# Patient Record
Sex: Female | Born: 1945 | Race: White | Hispanic: No | Marital: Single | Smoking: Current some day smoker
Health system: Southern US, Community
[De-identification: ages and names within clinical notes are randomized; demographics above are authoritative.]

## PROBLEM LIST (undated history)

## (undated) DIAGNOSIS — R0602 Shortness of breath: Secondary | ICD-10-CM

## (undated) DIAGNOSIS — T7840XA Allergy, unspecified, initial encounter: Secondary | ICD-10-CM

## (undated) DIAGNOSIS — I10 Essential (primary) hypertension: Secondary | ICD-10-CM

## (undated) DIAGNOSIS — K449 Diaphragmatic hernia without obstruction or gangrene: Secondary | ICD-10-CM

## (undated) DIAGNOSIS — F419 Anxiety disorder, unspecified: Secondary | ICD-10-CM

## (undated) DIAGNOSIS — F329 Major depressive disorder, single episode, unspecified: Secondary | ICD-10-CM

## (undated) DIAGNOSIS — IMO0002 Reserved for concepts with insufficient information to code with codable children: Secondary | ICD-10-CM

## (undated) DIAGNOSIS — F32A Depression, unspecified: Secondary | ICD-10-CM

## (undated) DIAGNOSIS — M1990 Unspecified osteoarthritis, unspecified site: Secondary | ICD-10-CM

## (undated) DIAGNOSIS — E7800 Pure hypercholesterolemia, unspecified: Secondary | ICD-10-CM

## (undated) DIAGNOSIS — J449 Chronic obstructive pulmonary disease, unspecified: Secondary | ICD-10-CM

## (undated) DIAGNOSIS — M719 Bursopathy, unspecified: Secondary | ICD-10-CM

## (undated) DIAGNOSIS — E119 Type 2 diabetes mellitus without complications: Secondary | ICD-10-CM

## (undated) HISTORY — DX: Chronic obstructive pulmonary disease, unspecified: J44.9

## (undated) HISTORY — PX: OTHER SURGICAL HISTORY: SHX169

## (undated) HISTORY — DX: Type 2 diabetes mellitus without complications: E11.9

## (undated) HISTORY — DX: Allergy, unspecified, initial encounter: T78.40XA

## (undated) HISTORY — PX: EYE SURGERY: SHX253

## (undated) HISTORY — PX: COLONOSCOPY: SHX174

## (undated) HISTORY — DX: Anxiety disorder, unspecified: F41.9

## (undated) HISTORY — DX: Depression, unspecified: F32.A

## (undated) HISTORY — DX: Major depressive disorder, single episode, unspecified: F32.9

## (undated) HISTORY — DX: Reserved for concepts with insufficient information to code with codable children: IMO0002

## (undated) HISTORY — DX: Essential (primary) hypertension: I10

## (undated) HISTORY — PX: NEUROPLASTY / TRANSPOSITION MEDIAN NERVE AT CARPAL TUNNEL BILATERAL: SUR894

## (undated) HISTORY — PX: ABDOMINAL HYSTERECTOMY: SHX81

---

## 1997-10-11 ENCOUNTER — Encounter: Payer: Self-pay | Admitting: Orthopedic Surgery

## 1997-10-11 ENCOUNTER — Ambulatory Visit (HOSPITAL_COMMUNITY): Admission: RE | Admit: 1997-10-11 | Discharge: 1997-10-11 | Payer: Self-pay | Admitting: Orthopedic Surgery

## 1998-03-01 ENCOUNTER — Other Ambulatory Visit: Admission: RE | Admit: 1998-03-01 | Discharge: 1998-03-01 | Payer: Self-pay | Admitting: Obstetrics and Gynecology

## 1998-12-30 ENCOUNTER — Encounter: Payer: Self-pay | Admitting: Family Medicine

## 1998-12-30 ENCOUNTER — Ambulatory Visit (HOSPITAL_COMMUNITY): Admission: RE | Admit: 1998-12-30 | Discharge: 1998-12-30 | Payer: Self-pay | Admitting: Family Medicine

## 2000-07-10 ENCOUNTER — Encounter: Payer: Self-pay | Admitting: Family Medicine

## 2000-07-10 ENCOUNTER — Encounter: Admission: RE | Admit: 2000-07-10 | Discharge: 2000-07-10 | Payer: Self-pay | Admitting: Family Medicine

## 2001-01-22 HISTORY — PX: ESOPHAGOGASTRODUODENOSCOPY: SHX1529

## 2001-08-18 ENCOUNTER — Ambulatory Visit (HOSPITAL_COMMUNITY): Admission: RE | Admit: 2001-08-18 | Discharge: 2001-08-18 | Payer: Self-pay | Admitting: Internal Medicine

## 2001-08-22 ENCOUNTER — Encounter: Payer: Self-pay | Admitting: Internal Medicine

## 2001-08-22 ENCOUNTER — Ambulatory Visit (HOSPITAL_COMMUNITY): Admission: RE | Admit: 2001-08-22 | Discharge: 2001-08-22 | Payer: Self-pay | Admitting: Internal Medicine

## 2001-09-02 ENCOUNTER — Ambulatory Visit (HOSPITAL_COMMUNITY): Admission: RE | Admit: 2001-09-02 | Discharge: 2001-09-02 | Payer: Self-pay | Admitting: Internal Medicine

## 2001-09-02 ENCOUNTER — Encounter: Payer: Self-pay | Admitting: Internal Medicine

## 2001-09-10 ENCOUNTER — Encounter: Payer: Self-pay | Admitting: Internal Medicine

## 2001-09-10 ENCOUNTER — Ambulatory Visit (HOSPITAL_COMMUNITY): Admission: RE | Admit: 2001-09-10 | Discharge: 2001-09-10 | Payer: Self-pay | Admitting: Internal Medicine

## 2002-07-22 ENCOUNTER — Encounter: Admission: RE | Admit: 2002-07-22 | Discharge: 2002-07-22 | Payer: Self-pay | Admitting: Family Medicine

## 2002-07-22 ENCOUNTER — Encounter: Payer: Self-pay | Admitting: Family Medicine

## 2002-11-11 ENCOUNTER — Emergency Department (HOSPITAL_COMMUNITY): Admission: EM | Admit: 2002-11-11 | Discharge: 2002-11-11 | Payer: Self-pay | Admitting: Emergency Medicine

## 2003-04-15 ENCOUNTER — Encounter: Admission: RE | Admit: 2003-04-15 | Discharge: 2003-04-15 | Payer: Self-pay | Admitting: Family Medicine

## 2003-11-21 IMAGING — CT CT ABDOMEN W/ CM
1 series · 15 of 32 positions shown, 19 images · IV contrast (gastroview & [ID] [id number])
Comparison: none

FINDINGS
CLINICAL DATA: RIGHT UPPER QUADRANT PAIN.
CT ABDOMEN AND PELVIS, WITH CONTRAST - 08/22/01 (930 HOURS)
CT ABDOMEN
AFTER THE INTRAVENOUS INJECTION OF 125 CC OF OMNIPAQUE 300, SPIRAL IMAGES THROUGH THE ABDOMEN AND
PELVIS WERE OBTAINED.
THE LIVER IS DIFFUSELY HYPOATTENUATED, COMPATIBLE WITH FATTY INFILTRATION.  THE GALLBLADDER IS
WITHIN NORMAL LIMITS.  THERE IS NO EVIDENCE OF BILIARY DILATATION.  THE SPLEEN, PANCREAS, KIDNEYS,
AND ADRENAL GLANDS ARE WITHIN NORMAL LIMITS.  IN THE RIGHT LOWER QUADRANT ON IMAGES 46 THROUGH 54,
THE APPENDIX IS SEEN AND IS FILLED WITH CONTRAST.
THERE IS NO EVIDENCE OF FREE FLUID OR ADENOPATHY IN THE ABDOMEN.
A SMALL 1.5 CM SOFT TISSUE DENSITY IS SEEN ANTERIOR TO THE SPLEEN, PROBABLY REFLECTING AN ACCESSORY
SPLEEN.
IMPRESSION
1.  DIFFUSE FATTY INFILTRATION OF THE LIVER.
2.  THE EXAM IS OTHERWISE WITHIN NORMAL LIMITS.
CT PELVIS, WITH CONTRAST
WITHIN THE PELVIS, THE BLADDER IS WITHIN NORMAL LIMITS.  THE UTERUS IS NOT SEEN WHICH MAY REFLECT
POST SURGICAL CHANGE.    GAS IS SEEN IN THE VAGINA.  THERE IS NO EVIDENCE OF FREE FLUID OR
ADENOPATHY.
ON BONE WINDOWS, PARS DEFECTS AT L5 BILATERALLY ARE SEEN.  THERE IS ANTEROLISTHESIS OF L5 UPON S1
APPROXIMATELY 25 PERCENT THE LENGTH OF THE VERTEBRAL BODY.
1.  PROBABLE STATUS POST  HYSTERECTOMY.
2.  SPONDYLOLISTHESIS AT L5-S1 WITH BILATERAL  PARS DEFECTS AS DESCRIBED.

[Series 2: abd pelvis · axial · 0.83mm/px · z∈[-454,-69]mm · 15 of 122 slices shown, 19 images]
[im 8/122  soft-tissue]
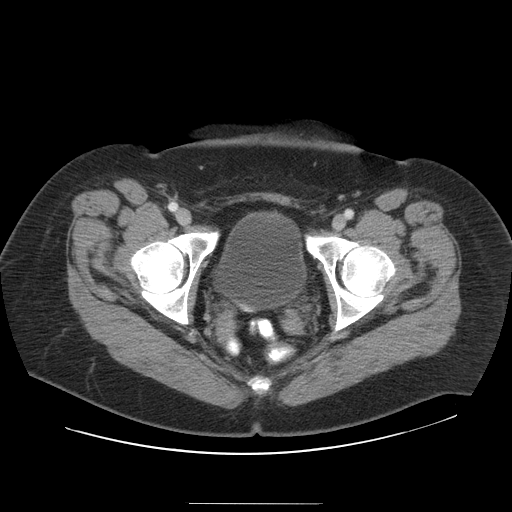
[im 8/122  bone]
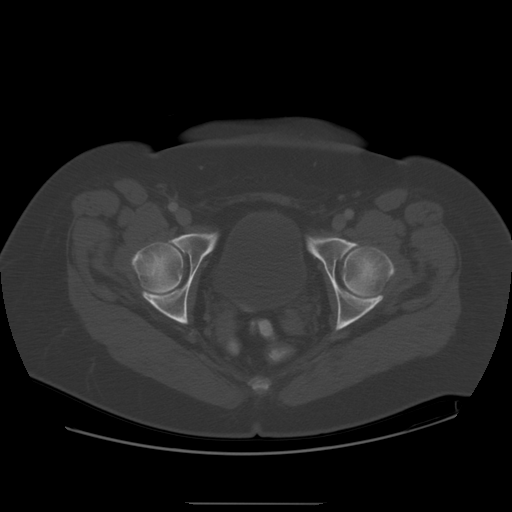
[im 16/122  soft-tissue]
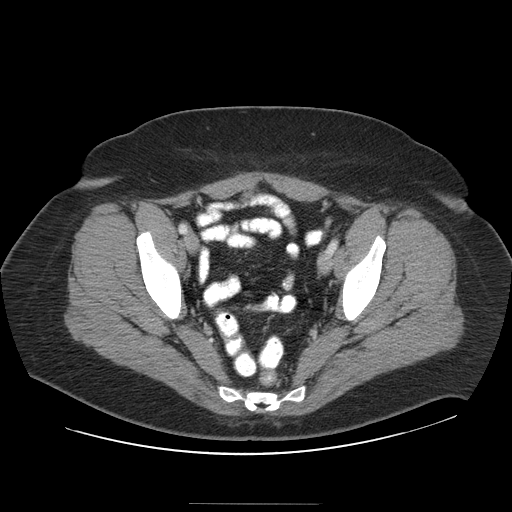
[im 24/122  soft-tissue]
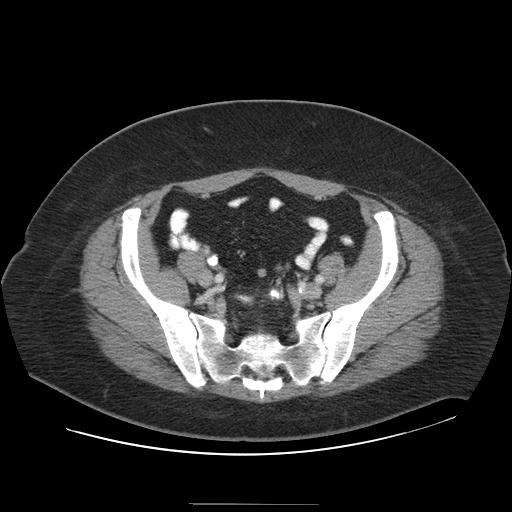
[im 36/122  soft-tissue]
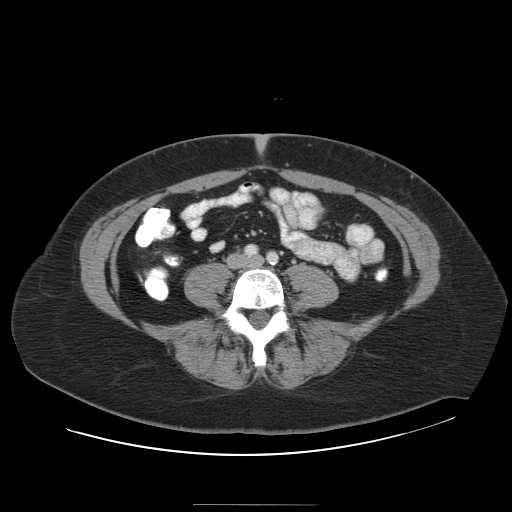
[im 43/122  soft-tissue]
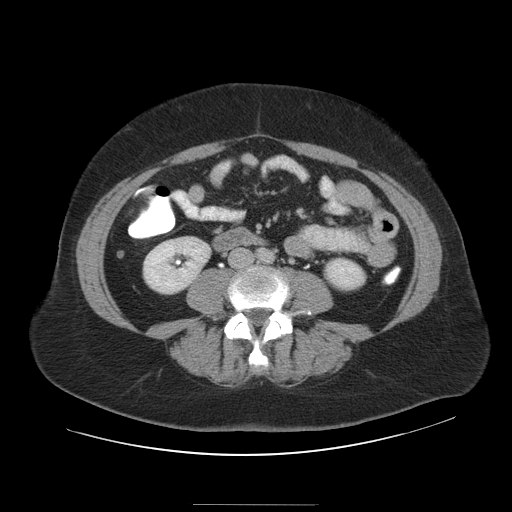
[im 51/122  soft-tissue]
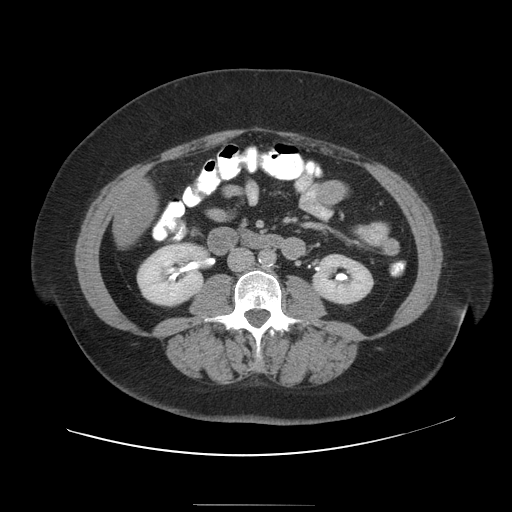
[im 63/122  soft-tissue]
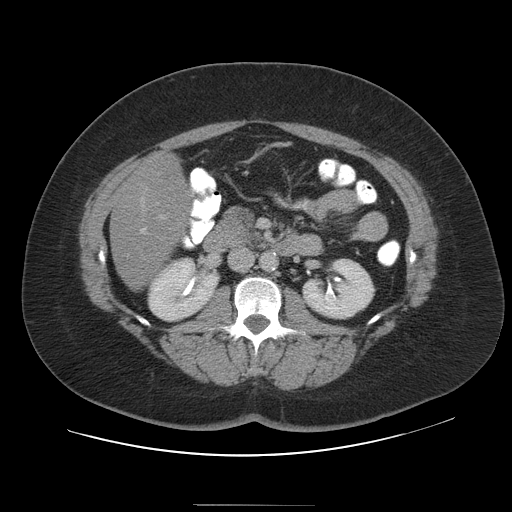
[im 71/122  soft-tissue]
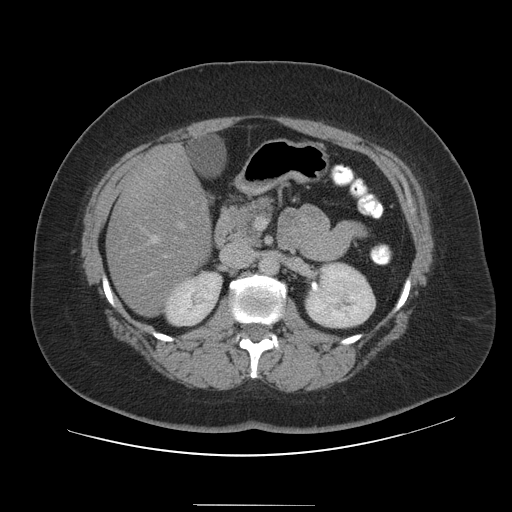
[im 79/122  soft-tissue]
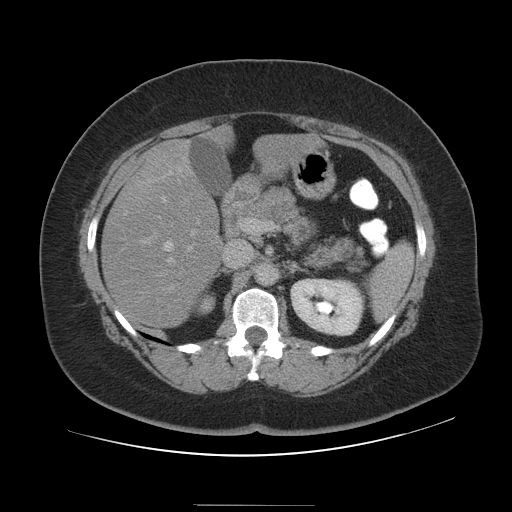
[im 79/122  bone]
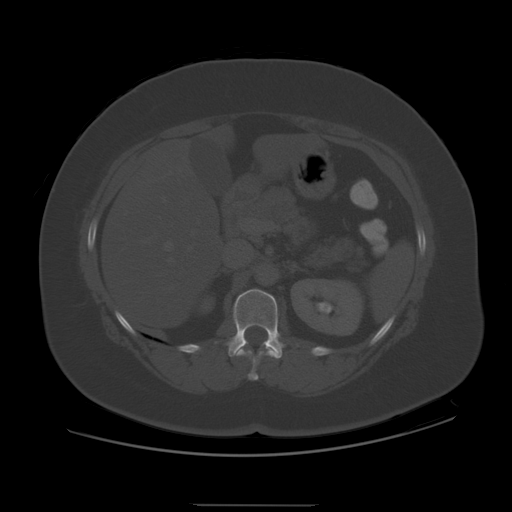
[im 86/122  soft-tissue]
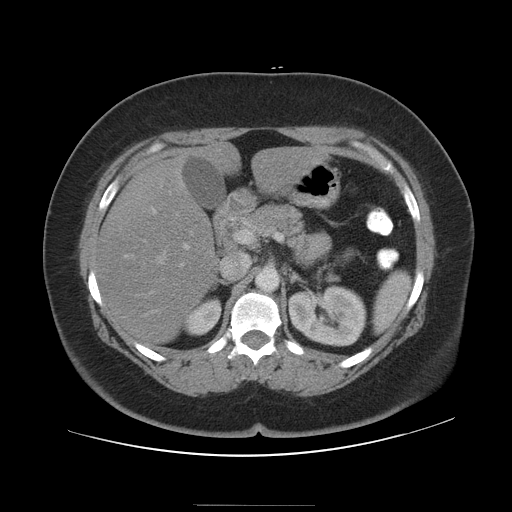
[im 98/122  soft-tissue]
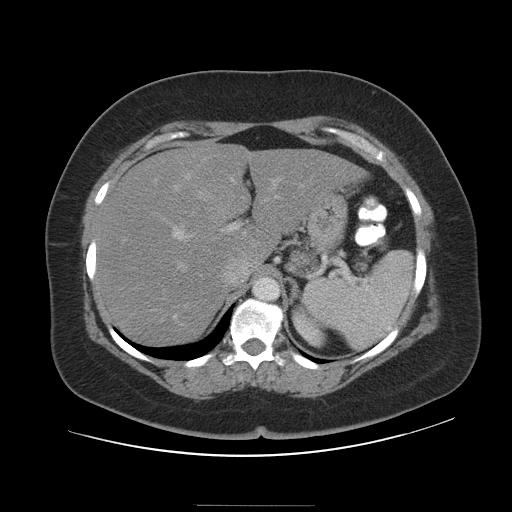
[im 106/122  soft-tissue]
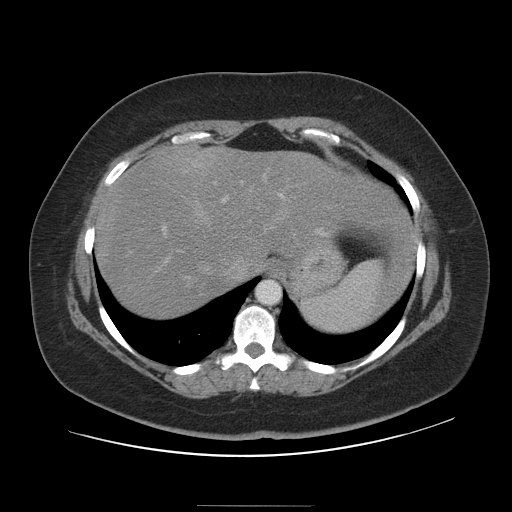
[im 106/122  lung]
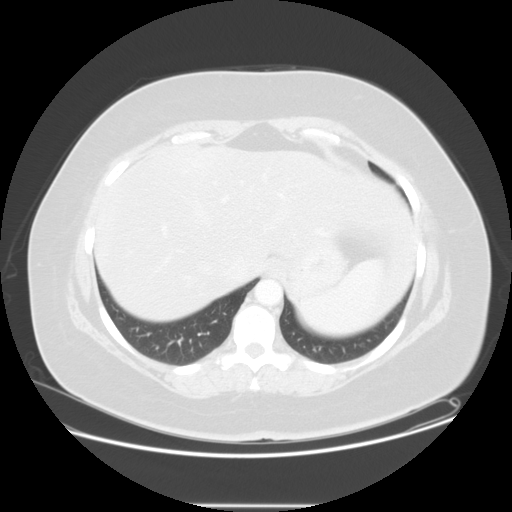
[im 110/122  lung]
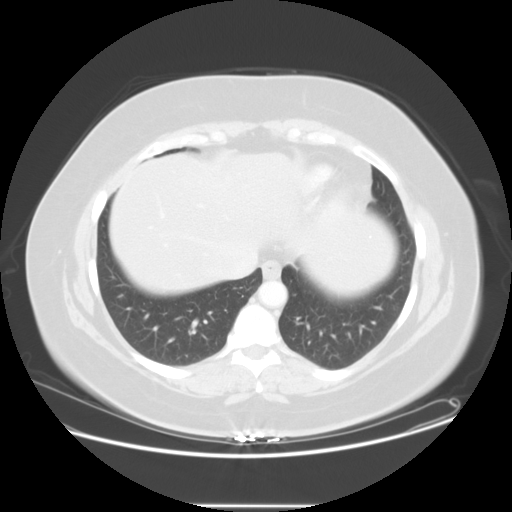
[im 114/122  soft-tissue]
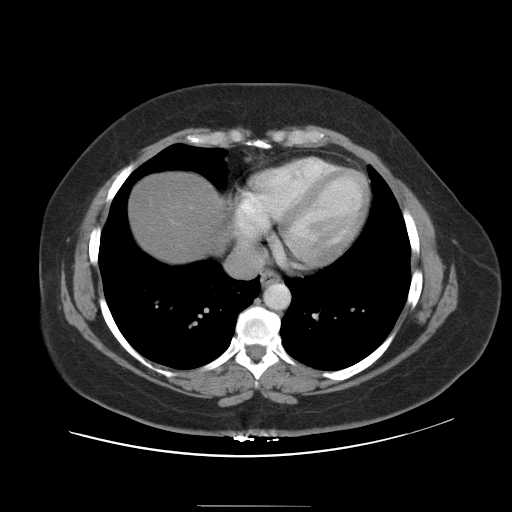
[im 114/122  lung]
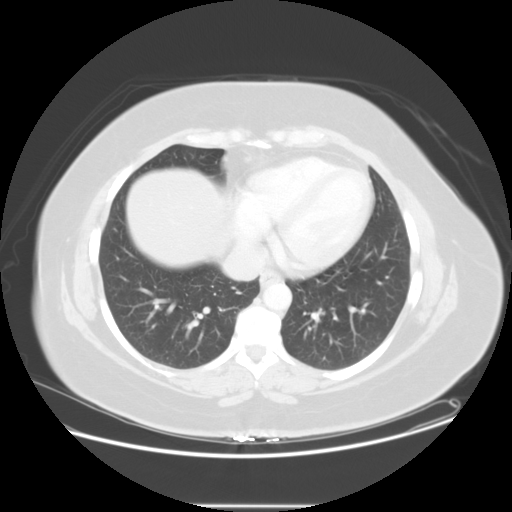
[im 118/122  lung]
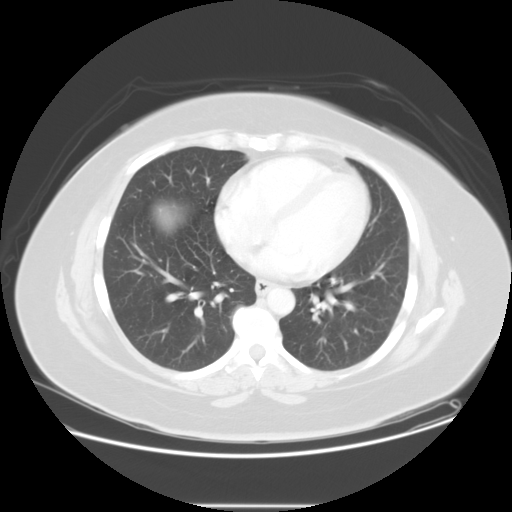

[15 of 32 positions shown; findings below may reference images not displayed]

## 2004-04-17 ENCOUNTER — Inpatient Hospital Stay (HOSPITAL_COMMUNITY): Admission: AD | Admit: 2004-04-17 | Discharge: 2004-04-18 | Payer: Self-pay | Admitting: *Deleted

## 2004-04-17 ENCOUNTER — Emergency Department (HOSPITAL_COMMUNITY): Admission: EM | Admit: 2004-04-17 | Discharge: 2004-04-17 | Payer: Self-pay | Admitting: Family Medicine

## 2005-05-23 ENCOUNTER — Ambulatory Visit (HOSPITAL_COMMUNITY): Admission: RE | Admit: 2005-05-23 | Discharge: 2005-05-23 | Payer: Self-pay

## 2005-06-22 ENCOUNTER — Ambulatory Visit (HOSPITAL_COMMUNITY): Admission: RE | Admit: 2005-06-22 | Discharge: 2005-06-23 | Payer: Self-pay | Admitting: Specialist

## 2005-06-22 ENCOUNTER — Encounter (INDEPENDENT_AMBULATORY_CARE_PROVIDER_SITE_OTHER): Payer: Self-pay | Admitting: *Deleted

## 2007-01-29 ENCOUNTER — Encounter: Admission: RE | Admit: 2007-01-29 | Discharge: 2007-01-29 | Payer: Self-pay | Admitting: Family Medicine

## 2007-01-30 ENCOUNTER — Ambulatory Visit: Payer: Self-pay | Admitting: Internal Medicine

## 2007-03-10 ENCOUNTER — Ambulatory Visit: Payer: Self-pay | Admitting: Orthopedic Surgery

## 2007-03-10 DIAGNOSIS — IMO0002 Reserved for concepts with insufficient information to code with codable children: Secondary | ICD-10-CM

## 2007-03-10 DIAGNOSIS — M76899 Other specified enthesopathies of unspecified lower limb, excluding foot: Secondary | ICD-10-CM

## 2007-03-10 DIAGNOSIS — M25559 Pain in unspecified hip: Secondary | ICD-10-CM

## 2007-03-18 ENCOUNTER — Telehealth: Payer: Self-pay | Admitting: Orthopedic Surgery

## 2007-03-21 ENCOUNTER — Telehealth: Payer: Self-pay | Admitting: Orthopedic Surgery

## 2007-03-25 ENCOUNTER — Encounter: Admission: RE | Admit: 2007-03-25 | Discharge: 2007-03-25 | Payer: Self-pay | Admitting: Family Medicine

## 2007-04-21 ENCOUNTER — Ambulatory Visit: Payer: Self-pay | Admitting: Cardiology

## 2007-05-01 ENCOUNTER — Ambulatory Visit: Payer: Self-pay

## 2007-05-22 ENCOUNTER — Inpatient Hospital Stay (HOSPITAL_COMMUNITY): Admission: RE | Admit: 2007-05-22 | Discharge: 2007-05-27 | Payer: Self-pay | Admitting: Neurosurgery

## 2008-10-23 ENCOUNTER — Ambulatory Visit (HOSPITAL_BASED_OUTPATIENT_CLINIC_OR_DEPARTMENT_OTHER): Admission: RE | Admit: 2008-10-23 | Discharge: 2008-10-23 | Payer: Self-pay | Admitting: Family Medicine

## 2008-10-30 ENCOUNTER — Encounter: Payer: Self-pay | Admitting: Pulmonary Disease

## 2008-10-31 ENCOUNTER — Ambulatory Visit: Payer: Self-pay | Admitting: Internal Medicine

## 2008-11-26 ENCOUNTER — Ambulatory Visit: Payer: Self-pay | Admitting: Pulmonary Disease

## 2008-11-26 DIAGNOSIS — R0902 Hypoxemia: Secondary | ICD-10-CM

## 2008-11-26 DIAGNOSIS — G4700 Insomnia, unspecified: Secondary | ICD-10-CM | POA: Insufficient documentation

## 2008-11-26 DIAGNOSIS — G4733 Obstructive sleep apnea (adult) (pediatric): Secondary | ICD-10-CM

## 2009-02-11 ENCOUNTER — Telehealth: Payer: Self-pay | Admitting: Pulmonary Disease

## 2009-03-28 ENCOUNTER — Ambulatory Visit (HOSPITAL_COMMUNITY): Admission: RE | Admit: 2009-03-28 | Discharge: 2009-03-28 | Payer: Self-pay | Admitting: Orthopaedic Surgery

## 2009-04-20 ENCOUNTER — Telehealth (INDEPENDENT_AMBULATORY_CARE_PROVIDER_SITE_OTHER): Payer: Self-pay | Admitting: *Deleted

## 2009-06-03 ENCOUNTER — Encounter: Payer: Self-pay | Admitting: Pulmonary Disease

## 2010-02-13 ENCOUNTER — Encounter: Payer: Self-pay | Admitting: Family Medicine

## 2010-02-23 NOTE — Letter (Signed)
Summary: CMN  CMN   Imported By: Lehman Prom 06/03/2009 16:46:52  _____________________________________________________________________  External Attachment:    Type:   Image     Comment:   External Document

## 2010-02-23 NOTE — Progress Notes (Signed)
Summary: req to d/c o2-lmtcb x1  Phone Note Call from Patient Call back at Home Phone [phone]   Caller: Patient Call For: clance Summary of Call: pt states that she no longer uses o2 tanks and requests they be removed. call DRS call home # or [phone] Initial call taken by: Tivis Ringer, CNA,  February 11, 2009 12:24 PM  Follow-up for Phone Call        ATC home number back and NA. Unable to leave msg. Called and [facility] on her cell # Vernie Murders  February 11, 2009 1:25 PM   Additional Follow-up for Phone Call Additional follow up Details #1::        Spoke with pt.  She states that she wants order to have o2 tanks picked up.  She states that she has sleeps fine without o2, up to 10 hours of sleep at night.  Advised appt to discuss this being that pt was told to followup in 4 wks.  Pt refused to sched appt unless absolutely needed.  Please advise thanks! Additional Follow-up by: Vernie Murders,  February 15, 2009 5:15 PM    Additional Follow-up for Phone Call Additional follow up Details #2::    let her know that it is my medical recommendation that she wear oxygen at hs.  if she chooses not to do so, is against medical advice.  Ok to send d/ c order, but document below the pt was informed that it is against medical advice and may harm her over time. Follow-up by: Barbaraann Share MD,  February 16, 2009 5:17 PM  Additional Follow-up for Phone Call Additional follow up Details #3:: Details for Additional Follow-up Action Taken: Pt advised that is was [facility] medical recommendation that she wear oxygen at hs, and if she chooses not to do so it is against medical advice. The pt stated the oxygen "stops up her head" to much at night and she can't breath through her nose. I asked if she had a humidifier with her O2 and she said no. I offered to send a note to [facility] asking for this, but the patietn stated "she just wanted the oxygen out of the house, it is taking up room."  I advised we would send  the order for O2 to be picked up. Carron Curie CMA  February 16, 2009 5:22 PM

## 2010-02-23 NOTE — Progress Notes (Signed)
Summary: o2 CMN  Phone Note Outgoing Call   Call placed by: Boone Master CNA,  April 20, 2009 4:19 PM Call placed to: Patient Summary of Call: ATC pt regarding CMN from [facility].  LMOM TCB.  pt last seen 12-06-08 and told to follow up in 4 weeks.  per 02-11-09 phone note pt stopped using her o2 AMA, but form needs to be filled out for ins to pay for it while she had it.  Initial call taken by: Boone Master CNA,  April 20, 2009 4:21 PM  Follow-up for Phone Call        called and spoke with pt.  pt aware she needs to schedule f/u appt with [facility] re CMN for when she had oxygen.  Pt states she is unsure of her work schedule at the moment and will have to call back to schedule this appt.  Left pt my name and number for her to reach me at. Aundra Millet Ahaana Rochette LPN  April 28, 452 4:43 PM      Appended Document: o2 CMN Tausha Milhoan, if she isn't going to wear oxygen and turned back in, I will fill out cmn just for time he used it.  doesn't need ov.  thanks.  Appended Document: o2 CMN ok. noted.

## 2010-03-24 ENCOUNTER — Encounter (INDEPENDENT_AMBULATORY_CARE_PROVIDER_SITE_OTHER): Payer: Self-pay | Admitting: *Deleted

## 2010-03-24 ENCOUNTER — Other Ambulatory Visit: Payer: Self-pay | Admitting: Family Medicine

## 2010-03-30 NOTE — Letter (Signed)
Summary: Pre Visit Letter Revised  [facility]  [address] [place], [place] [address]   Phone: [phone]  Fax: [phone]        03/24/2010 MRN: [id number] [facility] [address] [place], [place]  [address]             Procedure Date:  04/25/2010 @ 11:30   direct colon-Dr. Marina Goodell   Welcome to the Gastroenterology Division at [facility].    You are scheduled to see a nurse for your pre-procedure visit on 04/11/2010 at 10:30 on the 3rd floor at [facility], [address]  We ask that you try to arrive at our office 15 minutes prior to your appointment time to allow for check-in.  Please take a minute to review the attached form.  If you answer "Yes" to one or more of the questions on the first page, we ask that you call the person listed at your earliest opportunity.  If you answer "No" to all of the questions, please complete the rest of the form and bring it to your appointment.    Your nurse visit will consist of discussing your medical and surgical history, your immediate family medical history, and your medications.   If you are unable to list all of your medications on the form, please bring the medication bottles to your appointment and we will list them.  We will need to be aware of both prescribed and over the counter drugs.  We will need to know exact dosage information as well.    Please be prepared to read and sign documents such as consent forms, a financial agreement, and acknowledgement forms.  If necessary, and with your consent, a friend or relative is welcome to sit-in on the nurse visit with you.  Please bring your insurance card so that we may make a copy of it.  If your insurance requires a referral to see a specialist, please bring your referral form from your primary care physician.  No co-pay is required for this nurse visit.     If you cannot keep your appointment, please call [phone] to cancel or reschedule prior  to your appointment date.  This allows [place] the opportunity to schedule an appointment for another patient in need of care.    Thank you for choosing [facility] for your medical needs.  We appreciate the opportunity to care for you.  Please visit [place] at our website  to learn more about our practice.  Sincerely, The Gastroenterology Division

## 2010-04-11 ENCOUNTER — Ambulatory Visit (AMBULATORY_SURGERY_CENTER): Payer: Medicare PPO | Admitting: *Deleted

## 2010-04-11 VITALS — Ht 66.0 in | Wt 193.6 lb

## 2010-04-11 DIAGNOSIS — Z1211 Encounter for screening for malignant neoplasm of colon: Secondary | ICD-10-CM

## 2010-04-11 MED ORDER — PEG-KCL-NACL-NASULF-NA ASC-C 100 G PO SOLR: 1.0000 | Freq: Once | ORAL | Status: AC

## 2010-04-11 NOTE — Patient Instructions (Signed)
Pick up RX for Moviprep within 5 days of today.  

## 2010-04-12 ENCOUNTER — Encounter: Payer: Self-pay | Admitting: Internal Medicine

## 2010-04-25 ENCOUNTER — Other Ambulatory Visit: Payer: Self-pay | Admitting: Internal Medicine

## 2010-05-04 ENCOUNTER — Ambulatory Visit (AMBULATORY_SURGERY_CENTER): Payer: Medicare PPO | Admitting: Internal Medicine

## 2010-05-04 ENCOUNTER — Encounter: Payer: Self-pay | Admitting: Internal Medicine

## 2010-05-04 VITALS — BP 139/74 | HR 72 | Temp 97.5°F | Resp 17 | Ht 66.0 in | Wt 187.0 lb

## 2010-05-04 DIAGNOSIS — D126 Benign neoplasm of colon, unspecified: Secondary | ICD-10-CM

## 2010-05-04 DIAGNOSIS — R197 Diarrhea, unspecified: Secondary | ICD-10-CM

## 2010-05-04 DIAGNOSIS — K648 Other hemorrhoids: Secondary | ICD-10-CM

## 2010-05-04 DIAGNOSIS — Z1211 Encounter for screening for malignant neoplasm of colon: Secondary | ICD-10-CM

## 2010-05-04 LAB — GLUCOSE, CAPILLARY
Glucose-Capillary: 118 mg/dL — ABNORMAL HIGH (ref 70–99)
Glucose-Capillary: 87 mg/dL (ref 70–99)

## 2010-05-04 MED ORDER — SODIUM CHLORIDE 0.9 % IV SOLN: 500.0000 mL | INTRAVENOUS | Status: DC

## 2010-05-04 NOTE — Patient Instructions (Signed)
Please review discharge instructions.  Return to Dr. Marina Goodell or Dr. Yehuda Budd if diarrhea continues.

## 2010-05-05 ENCOUNTER — Telehealth: Payer: Self-pay | Admitting: *Deleted

## 2010-05-05 NOTE — Telephone Encounter (Signed)
Patient's number has been temporarily disconnected according to operator.

## 2010-05-23 ENCOUNTER — Emergency Department (HOSPITAL_COMMUNITY)
Admission: EM | Admit: 2010-05-23 | Discharge: 2010-05-24 | Disposition: A | Discharge status: Home / Self Care | Payer: Medicare PPO | Attending: Emergency Medicine | Admitting: Emergency Medicine

## 2010-05-23 DIAGNOSIS — M76899 Other specified enthesopathies of unspecified lower limb, excluding foot: Secondary | ICD-10-CM | POA: Insufficient documentation

## 2010-05-23 DIAGNOSIS — E7800 Pure hypercholesterolemia, unspecified: Secondary | ICD-10-CM | POA: Insufficient documentation

## 2010-05-23 DIAGNOSIS — E119 Type 2 diabetes mellitus without complications: Secondary | ICD-10-CM | POA: Insufficient documentation

## 2010-05-23 DIAGNOSIS — Z7982 Long term (current) use of aspirin: Secondary | ICD-10-CM | POA: Insufficient documentation

## 2010-05-23 DIAGNOSIS — F411 Generalized anxiety disorder: Secondary | ICD-10-CM | POA: Insufficient documentation

## 2010-06-06 NOTE — Assessment & Plan Note (Signed)
[facility]                            CARDIOLOGY OFFICE NOTE   Angela, Haney                     MRN:          [id number]  DATE:04/21/2007                            DOB:          Mar 06, 1945    REFERRING PHYSICIAN:  Danae Orleans. Venetia Maxon, M.D.   PRIMARY:  Tammy R. Collins Scotland, M.D.   REASON FOR CONSULTATION:  Evaluate patient with multiple cardiovascular  risk factors.  She is preop for back surgery.  She does have dyspnea on  exertion.   HISTORY OF PRESENT ILLNESS:  The patient is a pleasant 65 year old white  female without prior cardiac history.  She has multiple cardiac risk  factors.  She is due to have a lumbar surgery.  Because of  cardiovascular risk factors and her limited functional status, Dr. Venetia Maxon  referred her for preoperative consultation.  The patient does report  some dyspnea with exertion.  She is very limited in what she can do.  She has fallen arches, and is limited by foot pain.  She is now limited  by back pain.  She does vacuum on occasion.  She says she does not get  chest discomfort with this.  She may get short of breath with moderate  exertion.  This been somewhat chronic.  She does not report any resting  shortness of breath, does have any PND or orthopnea.  She does not  report chest pressure, neck or arm discomfort.  She does not have any  sustained palpitations, no presyncope or syncope.   I do not believe that the patient achieves greater than 4 METs of  activity routinely.   PAST MEDICAL HISTORY:  1. Diabetes mellitus for 1-1/2 years.  2. Borderline hypertension.  3. Hyperlipidemia.   PAST SURGICAL HISTORY:  1. Rotator cuff repair.  2. Hysterectomy.  3. D&Cs.   ALLERGIES:  CODEINE.   CURRENT MEDICATIONS:  1. Lipitor 20 mg daily.  2. Cymbalta 120 mg daily.  3. Wellbutrin 150 mg daily.  4. Trazodone 100 mg nightly.  5. Metformin 500 mg b.i.d.  6. Aspirin 81 mg daily.  7. Xanax.   SOCIAL HISTORY:  The  patient is on disability.  She is single.  She has  3 children.  She did have one child die of breast cancer.  She quit  smoking 2 days ago after 1 pack per day for 40 years.  She drinks  alcohol rarely.   FAMILY HISTORY:  Contributory for mother having coronary disease in her  31s.  She died of coronary disease at age 56.  She had had 2 bypasses.   REVIEW OF SYSTEMS:  As stated in the HPI and positive for occasional  wheezing, diarrhea, ulcers, reflux.  Negative for other systems.   PHYSICAL EXAMINATION:  The patient is in no distress.  Blood pressure  166/93, heart rate 79 and regular, weight 200 pounds, body mass index  33.  HEENT:  Eyelids unremarkable.  Pupils equal, round, and react to light.  Fundi within normal limits.  Oral mucosa unremarkable.  NECK:  No jugular distention at 45 degrees.  Carotid upstroke  brisk and  symmetric.  No bruits or thyromegaly.  LYMPHATICS:  No cervical, axillary, or inguinal adenopathy.  LUNGS:  Clear to auscultation bilaterally.  BACK:  No costovertebral angle tenderness.  CHEST:  Unremarkable.  HEART:  PMI not displaced or sustained.  S1 and S2 within normal.  No  S3, S4, no clicks, no rubs, no murmurs.  ABDOMEN:  Obese, positive bowel sounds.  Normal in frequency and pitch.  No bruits, rebound, guarding, or midline pulsatile mass.  No  hepatosplenomegaly.  SKIN:  No rashes.  EXTREMITIES:  Upper 2+ pulse throughout.  No edema, cyanosis, or  clubbing.  NEURO:  Oriented to person, place, and time.  Cranial nerves II-XII  grossly intact.  Motor grossly intact.   EKG sinus rhythm, rate 72, axis within normal limits, intervals within  normal limits, no acute ST-T wave change.   ASSESSMENT AND PLAN:  1. Preoperative clearance.  The patient has very limited functional      status.  She is going for a procedure that is at least moderate.      She does have significant cardiovascular risk factors.  She does      have some dyspnea on exertion which  could be an anginal equivalent.      Given this, and the ACC/NYHA guidelines, the patient needs      screening with a stress perfusion study.  She would not be able to      walk on a treadmill, so she will have an adenosine Cardiolite.      Further risk stratification will be based on these results.  2. Hypertension.  I suspect the patient will need medical management.      She is being followed closely by Dr. Collins Scotland.  Her blood pressure,      today, was elevated at as it was in Dr. Fredrich Birks office.  I have a      low threshold for treatment, but will defer to Dr.  Collins Scotland.  I have      suggested the patient get a blood pressure cuff, and keep a blood      pressure diary.  3. Dyslipidemia.  This is being followed by Dr. Collins Scotland.  With the      diabetes the goal would be an LDL of less than 100 and HDL greater      than 50.  4. Diabetes per Dr. Collins Scotland.  5. Tobacco.  I applaud the patient's ability to quit smoking, and      encourage complete abstinence going forward.  6. Followup, I will see the patient, again, as needed based on the      results of the test or future symptoms.  I will be in contact with      Dr. Venetia Maxon to complete the preoperative clearance.     Rollene Rotunda, MD, [facility]  Electronically Signed    JH/MedQ  DD: 04/21/2007  DT: 04/21/2007  Job #: [id number]   cc:   Tammy R. Collins Scotland, M.D.  Danae Orleans. Venetia Maxon, M.D.

## 2010-06-06 NOTE — Op Note (Signed)
[name], [name]              ACCOUNT NO.:  [id number]   MEDICAL RECORD NO.:  [id number]          PATIENT TYPE:  INP   LOCATION:  3027                         FACILITY:  [facility]   PHYSICIAN:  Danae Orleans. Venetia Maxon, M.D.  DATE OF BIRTH:  Sep 27, 1945   DATE OF PROCEDURE:  05/22/2007  DATE OF DISCHARGE:                               OPERATIVE REPORT   PREOPERATIVE DIAGNOSES:  1. 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  2. Degenerative disk disease.  3. Lumbar radiculopathy at the L4-L5 and L5-S1 levels.   POSTOPERATIVE DIAGNOSES:  1. 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  2. Degenerative disk disease.  3. Lumbar radiculopathy at the L4-L5 and L5-S1 levels.   PROCEDURE:  1. L5 Gill procedure.  2. L4-L5 laminectomy.  3. L4 and L5 transforaminal lumbar interbody fusion with PEEK cages,      morselized bone autograft, bone morphogenic protein and Fortoss.  4. Segmental pedicle screw fixation, L4 through S1 levels bilaterally.  5. Posterolateral arthrodesis with bone morphogenic protein, Fortoss,      and morselized bone autograft.   SURGEON:  Danae Orleans. Venetia Maxon, MD   ASSISTANTS:  1. Georgiann Cocker, RN  2. Clydene Fake, MD   ANESTHESIA:  General endotracheal anesthesia.   ESTIMATED BLOOD LOSS:  200 mL.   COMPLICATIONS:  None.   DISPOSITION:  Recovery.   INDICATIONS:  [name] is a 65 year old woman with severe left leg  pain.  She has L5 on S1 spondylolisthesis with L5 bilateral pars defects  and a significant disk degeneration at L4-L5 level with significant left  L5 nerve root compression.  It was elected to take her for surgery for  decompression and fusion at these affected levels.   PROCEDURE:  [name] was brought to the operating room.  Following a  satisfactory and uncomplicated induction of general endotracheal  anesthesia and placement of  intravenous lines, the patient was placed in  the prone position on the [place] table where low back, soft tissues,  and bony prominences were padded appropriately.  Low back was shaved.  Lower extremity was prepped and draped in the usual sterile fashion.  The incision was made in the midline after infiltrating the skin and  subcutaneous tissues with local lidocaine.  Incision was carried through  adipose tissues.  The lumbodorsal fascia was incised bilaterally  exposing the L4-L5 transverse processes and sacral alae.  The L5 level  posterior elements were hypermobile, consistent with bilateral pars  defects.  A self-retaining tractor was placed.  Intraoperative x-ray  confirmed correct orientation with marker probes at the L4 and L5  transverse processes and sacral alae.  Subsequently, a Gill procedure of  L5 was performed; and then using loupe magnification, the L5 nerve root  was decompressed.  There was a significant amount of compression as it  extended up the neural foramen.  Using a laminar spreader, the  interspace was opened up.  Then, with D'Errico nerve  root retractor, the  common dural tube and L5 and S1 nerve roots were protected.  The  interspace was incised, and significant osteophytic material was  removed.  The interspace was entered; and using variety of pituitary  rongeurs and endplate preparation curettes, the endplates were stripped  of residual disk material.  After sizing with an 8-mm spacer, it was  elected to place a 7-mm PEEK interbody cage.  This was packed with bone  morphogenic protein and Fortoss.  Fortoss was placed and the bone  morphogenic protein was placed deep within the interspace and then  additional bone graft, autograft was placed overlying this.  A similar  decompression was performed on the left side of midline at the L4-L5  level, and the thecal sac and L5 nerve root were decompressed as was the  L4 nerve root.  A significant spondylitic disk  degeneration in the L4-L5  level, this was incised.  Disk material was removed in piecemeal  fashion, and again endplates were prepared for grafting.  After  endplates were stripped of residual disk material; and using a variety  of endplate and preparation curettes, a 9-mm TLIF cage was selected and  packed with BMP  and Fortoss.  Additional BMP and Fortoss were placed  deep within the interspace.  The implant was inserted in the interspace.  It was not possible to rotate the implant to a lateral position, but it  was felt that it was securely within the interspace, and that was left  in its position.  Additional bone graft was placed overlying this.  Subsequently pedicle screw fixation was placed using 40 x 6.5-mm screws  at S1, 45 x 5.5-mm screws at L5, 45 x 5.5-mm screws at L4.  All screws  had excellent purchase, and positioning was confirmed on AP and lateral  fluoroscopy.  Finally, a 60-mm preloaded dose rods were fixed to the  screw heads and locked down with locking caps for counter-torque  appropriately.  The final bone graft was placed in the decorticated  posterolateral region with a small piece of the BMP in either  posterolateral region on the right side of midline.  The remaining bone  autograft and morselized bone autograft removed both with local bone and  also from drilling the bone at the time of surgery and was then placed  overlying the piece of BMP; and on the left side, approximately 10 mL of  Fortoss was placed in the posterolateral region.  Prior to doing so, the  wound was extensively irrigated.  Self-retaining retractor was then  removed.  Hemostasis was assured.  Lumbodorsal fascia was closed with 1  Vicryl sutures.  Subcutaneous tissues were approximated with 2-0 Vicryl  interrupted inverted sutures.  Skin edges were reapproximated with  interrupted 3-0 Vicryl subcuticular stitch.  Wound was dressed with  benzoin, Steri-Strips, Telfa gauze, and tape.  The  patient was extubated  in the operating room and taken to recovery having tolerated the  procedure well.  Counts were correct at the end of the case.      Danae Orleans. Venetia Maxon, M.D.  Electronically Signed     JDS/MEDQ  D:  05/22/2007  T:  05/23/2007  Job:  [id number]

## 2010-06-09 NOTE — Discharge Summary (Signed)
[name], [name]              ACCOUNT NO.:  [id number]   MEDICAL RECORD NO.:  [id number]          PATIENT TYPE:  INP   LOCATION:  3027                         FACILITY:  [facility]   PHYSICIAN:  Danae Orleans. Venetia Maxon, M.D.  DATE OF BIRTH:  06/04/1945   DATE OF ADMISSION:  05/22/2007  DATE OF DISCHARGE:  05/27/2007                               DISCHARGE SUMMARY   REASON FOR ADMISSION:  1. Lumbar disk displacement.  2. Acquired spondylolisthesis.  3. Lumbar disk degeneration.  4. Obesity, not otherwise specified.  5. Unspecified type 2 diabetes.  6. Hypertension.   FINAL DIAGNOSES:  1. Lumbar disk displacement.  2. Acquired spondylolisthesis.  3. Lumbar disk degeneration.  4. Obesity, not otherwise specified.  5. Unspecified type 2 diabetes.  6. Hypertension.  7. Lumbar radiculopathy.   HISTORY OF ILLNESS AND HOSPITAL COURSE:  [name] is a 66 year old  woman with L4-L5 spondylolisthesis and L5-S1 spondylolisthesis with  severe lumbar radiculopathy.  She is admitted to the hospital and  underwent uncomplicated decompression and fusion L4 through sacral  levels with interbody grafting and posterolateral arthrodesis with  segmental instrumentation.  Postoperatively, she did well, was gradually  mobilized in a back brace worked with physical therapy and occupational  therapy.  She is to start with oxycodone immediate release for pain  management.  On May 26, 2007, she was doing well, was discharged home  with some gradually improving bilateral lower extremity discomfort.  She  was mobilizing well and at the time of discharge had much improved  bilateral lower extremity discomfort, was taking OxyIR 10 mg every 4-6  hours as needed for pain, dispensed 40, Robaxin 500 mg every 6 hours as  needed with instructions to follow up in about 3 weeks for postoperative  check with Dr. Venetia Maxon.      Danae Orleans. Venetia Maxon, M.D.  Electronically Signed     JDS/MEDQ  D:  07/10/2007  T:   07/10/2007  Job:  [id number]

## 2010-06-09 NOTE — Op Note (Signed)
[name], SIGNOR              ACCOUNT NO.:  [id number]   MEDICAL RECORD NO.:  [id number]          PATIENT TYPE:  OIB   LOCATION:  5010                         FACILITY:  [facility]   PHYSICIAN:  Alvan Dame, D.P.M. DATE OF BIRTH:  March 15, 1945   DATE OF PROCEDURE:  06/22/2005  DATE OF DISCHARGE:  06/23/2005                                 OPERATIVE REPORT   PREOPERATIVE DIAGNOSES:  1.  Previous failed or broken fixation fourth and fifth metatarsal left      foot.  2.  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  3.  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   POSTOPERATIVE DIAGNOSIS:  1.  Previous failed or broken fixation fourth and fifth metatarsal left      foot.  2.  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  3.  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   OPERATION PERFORMED:  1.  Removal of screw fixation, failed or broken screw fourth metatarsal and      fifth metatarsals of the right foot.  2.  Harvesting of calcaneal bone graft from the calcaneus right foot.  3.  Arthrodesis or repair of nonunion, fourth and fifth metatarsal base and      cuboid articulation site.  4.  Application of external fixator single plane with pin fixations.   SURGEON:  Alvan Dame, D.P.M.   ANESTHESIA:  General augmented with local anesthetic, administered total of  20 mL 50/50 mixture of 2% Xylocaine plain and 0.5% Marcaine plain in a  regional block fashion around the right heel and ankle.   INDICATIONS FOR SURGERY:  The patient has a greater than one year history of  failed union of fourth and fifth metatarsal with fractured screws, fourth  and fifth metatarsal of the  right foot.  X-rays confirmed continued  displacement of the fourth and fifth metatarsal with arthrosis and continued  pain and symptomology and capsulitis.  The patient has failed despite the  use of  bone growth stimulator, nonweightbearing for a prolonged period of  time following initial surgery.  Initial surgery was performed on outpatient  basis on November 22, 2003. It has been nearly a year and a half since the  patient has had corrective surgery with failure of union. There are no  contraindications for surgery at this time and surgery will proceed as  scheduled.  The patient has obtained medical clearance from her primary  physician.  At this time no contraindications are noted.   DESCRIPTION OF PROCEDURE:  The patient was brought to the operating room and  placed on the table in supine position.  IV sedation and general anesthesia  were established.  The foot was then infiltrated with local anesthetic.  Betadine prep was performed.  The right  foot was then exsanguinated with an  Esmarch wrap and ankle tourniquet inflated to 250 mmHg.  The following  procedures were then carried out.   Procedure #1:  Harvesting bone graft, right calcaneus.  Attention was  directed to the lateral aspect of the right foot overlying the calcaneus and  at this time, approximately 5 cm curvilinear incision was made along the  lateral aspect of the posterior calcaneus.  The incision was deepened via  sharp and blunt dissection with care to reflect any neurovascular bundles  including the sural nerve.  At this time dissection was deepened to the  level of the lateral body of the calcaneus where periosteum was incised and  a window was made along the lateral calcaneal body.  At this time utilizing  drills, four drill holes were made for the corners and a square  approximately 1.5 cm square window was opened along the lateral calcaneal  body.  Utilizing power instrumentation and osteotome, a cube of  bone  approximately 1.5 cm in size was removed from the lateral body of the  calcaneus about 1.5 cm thick as well.  The defect at this time was filled  with autogenous bone chips and impacted utilizing a bone tamp along the  lateral surface until they were flush with the lateral surface of the  calcaneus.  The site was then lavaged with copious amounts of sterile  antibiotic solution, cleared of all soft tissue and osseous debris.  Capsule  of periosteum closed overlying the bone graft donor site and the subcu  tissue and skin were reapproximated with 4-0 and 5-0 Vicryl respectively.  The bone graft was placed in saline gentamicin solution for use in the  following procedures.   Procedures #2 and 3:  Attention was directed to the base of the fourth  metatarsal overlying the top of the foot.  Approximately 5 to 6 cm  curvilinear incision was made centered over the fourth metatarsal base area.  The incision deepened via sharp and blunt dissection to the level of the  capsular structures.  At this time a linear capsular incision was made and  the head of the screw to the fourth metatarsal base was identified with some  bone growth growing over that.  Rongeur was utilized to window the bone and  a screw driver was introduced and with minimal effort, the screw which was  loosened and noted to be fractured radiographically, was resected from the  site in toto.  The end of the screw was still left intact within the cuboid  bone itself.  This was confirmed radiographically.  At this time attention  was directed to the fifth metatarsal where again, about a 5 cm curvilinear  incision was made overlying the fifth metatarsal base on the lateral aspect.  The incision deepened via sharp and blunt dissection with hemostasis being  acquired as necessary to the level of the capsular structures.  The capsular  incision was made and the periosteum over the bone was reflected.  It should be noted at this  time that there was bony overgrowth overlying the screw  head to the fifth metatarsal which was resected with rongeur.  A screw  driver was utilized to remove the screw to the fifth metatarsal.  Once  screws were removed, there was noted to be a considerable amount of  looseness and instability along the fourth and fifth metatarsal, nonunion  sites.  At this time upon completion of removal of old fixation, the  following procedures were then carried out.  Arthrodesis of fourth  metatarsal, cuboid articulation, nonunion site.  Attention was directed to  the fourth metatarsal base where previous incision for removal of screw was  lengthened proximally along the capsule.  The fourth metatarsal cuboid  articulation site was easily visualized at this time utilizing osteotomes  and power instrumentation.  The base of the fourth metatarsal and articular  surface of the metatarsal which were apposed to another with interposing  adipose and cartilaginous tissue being kept, cartilaginous and fibrous  tissue being present.  This was thoroughly curetted from the site and  totally removed from the articular surface. At this time subchondral  drilling was carried out utilizing a 2-0 drill both into the cuboid and the  base of the fourth metatarsal to promote some intraosseous bleed and prepare  the area for placement of autogenous bone graft which had been previously  obtained.  At this time the autogenous bone graft was fashioned into  approximately 2 mm thick slices and at least two of the slices were at this  time prepared and placed into the fusion site and impacted within the sites  with a bone tamp.  At this time the site would be closed and external  fixator will be placed for compression of that osteotomy site. The closure  at this time was carried out utilizing 3-0 Dexon to reapproximate subcu and  capsule tissue and 4-0 Dexon for subcutaneous tissue and skin reapproximated  with 5-0 nylon in a  continuous interlocking fashion.  Attention now was  directed to the next procedure, arthrodesis, repair of nonunion of fifth  metatarsal and cuboid articulation site.  Lateral incision of fifth  metatarsal base was lengthened proximally overlying the articular surface of  the fifth metatarsal.  Capsular tissue was also incised from previous  incision to the level of the articular surface which was easily visualized.  Utilizing bone curette, osteotome and power instrumentation, the redundant  articular surface and interposed fibrous connective tissue was resected from  the fusion in the articular site.  Subchondral drilling was carried out  utilizing the 2-0 drill and once adequate preparation of the arthrodesis  site was carried out, two 2 mm wedges of bone which were fabricated from the  previously harvested autograft site were introduced into the articulation.  They were also impacted with the bone tamp and the fifth metatarsal was noted to be in a rectus position.  The site was lavaged with copious amounts  of sterile antibiotic solution and closure was accomplished utilizing 3-0  Dexon for deep capsule, 4-0 Dexon for subcutaneous tissue and skin with 5-0  nylon in a continuous interlocking fashion.  Following completion of these  preparations, placement of external fixator was carried out as follows.   Final procedure:  At this time the Orthofix external fixator with a single  rail and three bodies was applied.  The two proximal pins were placed into  the cuboid bone into the proximal body and distal pins were placed into the  two additional bodies.  Two pins placed in the fifth metatarsal base and two  pins placed into the fourth metatarsal base and each of the additional  bodies starting with the fifth metatarsal body was tightened to produce a  compression across the arthrodesis site of the fifth metatarsal and cuboid  and then the fourth metatarsal body was compressed and alignment  was  verified utilizing fluoroscopy throughout the procedure.  Upon placement of  the  external fixator, the foot was noted to be in a rectus position.  Fixation was accomplished and dry sterile compressive dressing was applied  and ankle tourniquet was finally deflated.  I should note that  intraoperatively, the tourniquet was deflated for a period after the first  one hour and 50 minutes, the tourniquet was deflated for a 10 to 15 minute  period and then reinflated for application of the external fixator and final  closures for an additional 30 to 40 minutes.  Upon completion of all  procedures, the ankle tourniquet was deflated with immediate return of  perfusion to all digits being noted.   The patient tolerated the procedure and anesthesia well, was returned from  the OR to recovery in satisfactory condition.  The patient will be  discharged per anesthesia.  Discharged with written postoperative  instructions, prescriptions for pain and antibiotic medications.  The  patient already has wheelchair and walker, understands she is to be  nonweightbearing for at least an 8 to 12-week duration.           ______________________________  Alvan Dame, D.P.M.     RS/MEDQ  D:  06/25/2005  T:  06/26/2005  Job:  [id number]

## 2010-06-09 NOTE — Discharge Summary (Signed)
[name], [name]              ACCOUNT NO.:  [id number]   MEDICAL RECORD NO.:  [id number]          PATIENT TYPE:  OIB   LOCATION:  5010                         FACILITY:  [facility]   PHYSICIAN:  Alvan Dame, D.P.M. DATE OF BIRTH:  1945-08-06   DATE OF ADMISSION:  06/22/2005  DATE OF DISCHARGE:  06/23/2005                                 DISCHARGE SUMMARY   Ms. Mahadeo presented to the [facility] inpatient surgery  yesterday for correction of a non-union fourth and fifth metatarsals with  use of bone graft and placement of external fixers as well as retrieval of  previous failed fixation.  Patient underwent surgery yesterday uneventfully  and was to be discharged per anesthesia.  However, with postoperative pain  being a concern per patient's family request and my recommendation patient  will be kept for a 23-hour stay for pain management.  Patient was placed on  a PCA pump with Dilaudid per pharmacy protocol.  Patient is ready for  discharge this morning with minimal or no distress.  Dressings and fixers  are intact.  Neurovascular status to the foot and toes is unremarkable.  Patient's stay in the hospital was uneventful for her surgery to her right  foot.  Patient will be discharged with postoperative medications including  prescriptions for Percocet 5/325 total of 60 tablets and a prescription for  cephalexin 500 mg.  I should note that intraoperatively patient did receive  Ancef IV both pre and postoperatively and was also administered Dilaudid  during her postoperative course following surgery.  Patient is discharged to  home in care of her family.  Will maintain non-weightbearing with use of  crutches and wheelchair and will be followed appropriately in the office  within one week's time.  Patient again discharged with intact neurovascular  status to the foot.  Vital signs stable and with appropriate pain management  to be carried out with p.o. Percocet.  Patient is  instructed to contact us  if there is any further complications.  Is examined this morning just under  24 hours postoperative.  The patient appears to be stable and ready for  discharge.           ______________________________  Alvan Dame, D.P.M.     RS/MEDQ  D:  06/25/2005  T:  06/26/2005  Job:  [id number]

## 2010-06-09 NOTE — Discharge Summary (Signed)
NAMEANDERSEN, MCKIVER              ACCOUNT NO.:  [id number]   MEDICAL RECORD NO.:  [id number]          PATIENT TYPE:  INP   LOCATION:  6714                         FACILITY:  [facility]   PHYSICIAN:  Mallory Shirk, MD     DATE OF BIRTH:  1945-03-10   DATE OF ADMISSION:  04/17/2004  DATE OF DISCHARGE:  04/18/2004                                 DISCHARGE SUMMARY   DISCHARGE DIAGNOSES:  1.  Upper respiratory infection.  2.  Hyperglycemia.  3.  Depression.   MEDICATIONS ON DISCHARGE:  1.  Doxycycline 100 mg p.o. b.i.d. x 7 days.  2.  Guaifenesin 200 mg p.o. t.i.d.   FOLLOW UP APPOINTMENTS:  With Dr. Celene Skeen [facility] on  April 21, 2004, at 10:30 a.m., at which time Dr. Celene Skeen will check a CBC and  BMP.  Dr. Celene Skeen will also set up an appointment with psychiatry for  patient.   HISTORY OF PRESENT ILLNESS:  Ms. [name] is a pleasant 65 year old  Caucasian woman with multiple orthopedic surgeries on her right foot and an  otherwise unremarkable past medical history, presenting with chief complaint  of productive cough x 2 weeks.  The patient was seen at [facility] on  April 10, 2004, and given a Z-pack.  That has not resolved her productive  cough.  Initially, the phlegm was green.  Now it is clear.  The patient has  no shortness of breath, chest pain, or any other symptoms.  She does  complain of some hot flashes periodically.  No fever.  The patient also  stated that she felt unwell and had noticed some hoarseness.  The patient  was seen at [facility]gent [facility] and reported for admission.   PAST MEDICAL HISTORY:  1.  Status post tubal ligation 10 years ago.  2.  Status post hysterectomy for fibroids, endometriosis 10 years ago.  3.  Multiple surgeries on her right foot.   MEDICATIONS:  None.   ALLERGIES:  CODEINE gives her a rash.   PHYSICAL EXAMINATION:  VITAL SIGNS:  Blood pressure 114/80, pulse 93,  respiration 20, temperature 97.5, saturations 97% on room  air.  GENERAL:  Alert and oriented x 3.  Communicative.  No shortness of breath at  rest.  No ache or discomfort.  HEENT:  Normocephalic, atraumatic.  PERRLA.  Sclerae anicteric, mucous  membranes moist, oropharynx not erythematous.  NECK:  Supple, no lymphadenopathy, no JVD.  LUNGS:  Clear to auscultation bilaterally.  No wheezes, no rales.  CARDIOVASCULAR SYSTEM:  S1, S2, regular rate, rhythm.  No murmurs, rubs, or  gallops.  ABDOMEN:  Soft, positive bowel sounds, no tenderness, no masses.  EXTREMITIES:  No cyanosis, clubbing, or edema.  Healed surgical scars on the  right foot.  NEUROLOGIC:  Nonfocal.   LABORATORY DATA ON ADMISSION:  WBC 17.0, hemoglobin 18.7, hematocrit 55.0,  platelets 412, urinalysis negative.  Chest x-ray:  No active disease.   HOSPITAL COURSE:  The patient was admitted to a regular bed.  #1 - UPPER RESPIRATORY INFECTION:  The patient was started on doxycycline  100 mg p.o. b.i.d.  She was also given guaifenesin for symptomatic relief of  cough.  On hospital day #2, the patient's cough had decreased.  She was  feeling much better than she did when she was admitted and would like to go  home.  She will be discharged on guaifenesin p.r.n. and doxycycline for 7  days.   #2 - HYPERGLYCEMIA WITHOUT A DIAGNOSIS OF DIABETES MELLITUS:  The patient's  blood sugars were 173 on admission.  On the morning of discharge, her blood  sugar was 207.  Since the patient does not have a diagnosis of diabetes  mellitus, further diagnosis and work-up is deferred to primary care  physician, Dr. Celene Skeen, with whom patient has an appointment on April 21, 2004.   #3 - DEPRESSION:  The patient has a history of depression.  Has been seen by  a counselor and was on antidepressants.  The patient's daughter passed away  3 years ago of breast cancer, and she has had difficulty recovering from  this.  While talking about it, this patient is tearful.  She has agreed to  talk to the primary  care, Dr. Celene Skeen and arrange an outpatient psychiatric  appointment.  Please note that an appointment was made for her before, but  she chose not to pursue it and saw a counselor instead.  Also of note, the  patient weaned herself off of her antidepressants.  The patient was advised  to follow up with her psychiatrist and keep the appointment and take the  antidepressants as prescribed.   The patient was counseled to quit smoking and avoid alcohol use.  The  patient states she drinks occasional alcohol.  It is unclear how much  alcohol she drinks.  She was discharged in stable condition.  At the time of  discharge, her vital signs were stable with blood pressure at 124/76, pulse  85, respiration 18, and temperature 96.1.  She was ambulatory without  difficulty.  Her son will come pick her up.      GDK/MEDQ  D:  04/18/2004  T:  04/18/2004  Job:  [id number]   cc:   Charlesetta Shanks  [address]  [place] [address]  Fax: [phone]

## 2010-06-09 NOTE — H&P (Signed)
[name], [name]              ACCOUNT NO.:  [id number]   MEDICAL RECORD NO.:  [id number]          PATIENT TYPE:  INP   LOCATION:  6714                         FACILITY:  [facility]   PHYSICIAN:  Isidor Holts, M.D.  DATE OF BIRTH:  20-Oct-1945   DATE OF ADMISSION:  04/17/2004  DATE OF DISCHARGE:                                HISTORY & PHYSICAL   PRIMARY MEDICAL ATTENDING:  [facility].   CHIEF COMPLAINT:  Productive cough for approximately two weeks, also feeling  unwell for the past two weeks.  Admits to feeling hot.   HISTORY OF PRESENT ILLNESS:  This is a 65 year old Caucasian female who is  status post multiple previous orthopedic procedures and surgery, otherwise  unremarkable past medical history.  She has been feeling unwell for the past  two weeks; i.e., has a cough productive of greenish phlegm, but now the  phlegm is quite clear.  Denies shortness of breath, denies chest pain.  Has  felt hot flashes but no documented fever.  Utilized over-the-counter  medications including Advil, NyQuil, Alka-Seltzer without much relief.  She  went to see her primary MD on April 10, 2004, at [facility], was treated with a full course of Z-Pak and a tapering course of  prednisone.  She completed prednisone on April 15, 2004; however, is still  feeling unwell, although cough has improved.  She has some residual  hoarseness.  [facility] 17, 2004, and  was referred to [facility] for direct admission.   PAST MEDICAL HISTORY:  1.  Status post tubal ligation approximately 10 years ago.  2.  Status post hysterectomy for fibroids/endometriosis approximately 10      years ago.  3.  Status post bilateral carpal tunnel surgery.  4.  Status post left trigger thumb repair status post tendon surgery, left      elbow.  5.  Status post repair of right rotator cuff.  6.  Status post bilateral plantar fasciitis  surgery.  7.  Status post orthopedic surgery, right foot, in October 2005.  Has      hardware in situ.   MEDICATIONS:  Not on any regular medications.   ALLERGIES:  CODEINE causes pruritus.   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  She is divorced approximately 20 years.  She is a smoker, one pack of cigarettes per day since age 49 years.  Drinks  alcohol occasionally.  Denies drug abuse.   FAMILY HISTORY:  Has three offspring, alive and well.  Her brother died of  lung cancer with brain metastases.  Her mother died of a bad heart.  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  She has one  brother who is a colon cancer survivor, had colon cancer at age 68 years.  She also has two sisters both of whom are alive and well.   REVIEW OF SYSTEMS:  Essentially as in HPI, Chief  Complaint.  Denies  frequency, denies polyuria, polydipsia, or dysuria.  Otherwise system review  is negative.   PHYSICAL EXAMINATION:  VITAL SIGNS:  Temperature 97.5, pulse 93 per minute  and regular, respiratory rate 20, blood pressure 114/80 mmHg, pulse oximetry  97% on room air.  GENERAL:  The patient appears alert and oriented.  No shortness of breath at  rest.  Does not appear to be in acute discomfort.  HEENT:  No parenchymal pallor, no jaundice, no conjunctival injection.  Throat appears quite clear.  NECK:  Supple.  JVP not seen.  There is no palpable lymphadenopathy, no  palpable goiter.  CHEST:  Clear to auscultation.  No wheezes, no crackles.  CARDIAC:  Heart sounds 1 and 2 heard, normal, regular.  No murmurs.  ABDOMEN:  Full, soft, and nontender.  There is no palpable organomegaly, no  palpable masses.  Normal bowel sounds.  LOWER EXTREMITIES:  No pitting edema.  Palpable peripheral pulses.  Healed  surgical site noted on the dorsum of the right foot.  MUSCULOSKELETAL:  Normal.  CENTRAL NERVOUS SYSTEM:  No  focal neurological deficits on gross  examination.   INVESTIGATIONS:  CBG 173.  CBC: WBC 17.0, hemoglobin 18.7, hematocrit 55.0,  platelets 412.  Neutrophils 66%.  Urinalysis negative apart from glycosuria  of 500.   Chest x-ray:  No active disease is seen; however, there are prominent  markings.   ASSESSMENT AND PLAN:  1.  Nonspecifically unwell.  Still has residual respiratory symptoms such as      cough and dysphonia.  This is likely secondary to resolving acute      bronchitis, given history of treatment with a full course of      azithromycin and prednisone.  We will continue Robitussin and complete      treatment with doxycycline.  However, in view of slow resolution, will      check TSH to rule out hypothyroidism.   1.  Elevated white cell count.  This is likely secondary to recent steroid      treatment.  It is noted that patient completed tapering course of      steroids on March 31, 2004.  However, an important consideration is      residual infection.  It is noted that urinalysis is negative.  Blood      cultures have been sent.  Chest x-ray is also negative.  Dehydration may      also be contributory, as hemoglobin is also elevated. We shall treat      with normal saline infusion and follow electrolytes and WBC.  If      elevated hemoglobin levels persist, she will need workup for possible      polycythemia.   1.  Glycosuria.  Query new onset diabetes mellitus.  Will do regular Accu-      Cheks, cover with sliding scale insulin, and commence diabetic diet.      Will also administer intravenous fluids.  Do hemoglobin A1C.  If      hyperglycemia persists, will need appropriate medications.  Further management will depend on clinical course.      CO/MEDQ  D:  04/17/2004  T:  04/17/2004  Job:  [id number]

## 2010-06-09 NOTE — Letter (Signed)
May 06, 2007    [name]. Venetia Maxon, M.D.  [address]  [address]  [place],  [place] [address]   RE:  [name], [name]  MRN:  [id number]  /  DOB:  09/16/1945   Dear Dr. Venetia Maxon:   Recently I had the pleasure of meeting Ms. [name].  She was referred  for pre-operative evaluation.  She has multiple cardiovascular risk  factors, but no prior cardiac history.  She is somewhat limited in her  functional status.  Because of this and according to ACC/AJ guidelines,  I sent her for a stress perfusion study.  I was happy to see that this  demonstrated no evidence of ischemia or infarct.  Her ejection fraction  was 57%.  Based on this study, no further cardiovascular testing is  suggested and the patient is at acceptable risk for the planned surgery.  She should continue with the medicines listed.  We would be happy to be  involved if there are any questions at the time of surgery.  If you have  any questions about this letter, please do not hesitate to give me a  call. My cell phone number is [phone].    Sincerely,      Rollene Rotunda, MD, [facility]  Electronically Signed    JH/MedQ  DD: 05/06/2007  DT: 05/06/2007  Job #: [id number]   CC:    Tammy R. Collins [place], M.D.

## 2010-09-11 NOTE — Progress Notes (Signed)
Addended by: Maple Hudson on: 09/11/2010 09:49 AM   Modules accepted: Orders

## 2010-10-10 ENCOUNTER — Encounter (HOSPITAL_COMMUNITY)
Admission: RE | Admit: 2010-10-10 | Discharge: 2010-10-10 | Disposition: A | Discharge status: Home / Self Care | Payer: Medicare HMO | Source: Ambulatory Visit | Attending: Ophthalmology | Admitting: Ophthalmology

## 2010-10-10 ENCOUNTER — Encounter (HOSPITAL_COMMUNITY): Payer: Self-pay

## 2010-10-10 ENCOUNTER — Other Ambulatory Visit: Payer: Self-pay

## 2010-10-10 HISTORY — DX: Diaphragmatic hernia without obstruction or gangrene: K44.9

## 2010-10-10 HISTORY — DX: Pure hypercholesterolemia, unspecified: E78.00

## 2010-10-10 LAB — CBC
Hemoglobin: 12 g/dL (ref 12.0–15.0)
MCH: 30.2 pg (ref 26.0–34.0)
MCHC: 33.3 g/dL (ref 30.0–36.0)
MCV: 90.5 fL (ref 78.0–100.0)
RBC: 3.98 MIL/uL (ref 3.87–5.11)

## 2010-10-10 LAB — BASIC METABOLIC PANEL
CO2: 27 mEq/L (ref 19–32)
Calcium: 8.9 mg/dL (ref 8.4–10.5)
Creatinine, Ser: 0.67 mg/dL (ref 0.50–1.10)
GFR calc non Af Amer: >60 mL/min (ref >60)
Glucose, Bld: 122 mg/dL — ABNORMAL HIGH (ref 70–99)

## 2010-10-10 NOTE — Patient Instructions (Addendum)
20 [name]  10/10/2010   Your procedure is scheduled on:  10/16/2010  Report to [facility] at  700  AM.  Call this number if you have problems the morning of surgery: [phone]   Remember:   Do not eat food:After Midnight.  Do not drink clear liquids: After Midnight.  Take these medicines the morning of surgery with A SIP OF WATER: xanax,wellbutrin,effexor   Do not wear jewelry, make-up or nail polish.  Do not wear lotions, powders, or perfumes. You may wear deodorant.  Do not shave 48 hours prior to surgery.  Do not bring valuables to the hospital.  Contacts, dentures or bridgework may not be worn into surgery.  Leave suitcase in the car. After surgery it may be brought to your room.  For patients admitted to the hospital, checkout time is 11:00 AM the day of discharge.   Patients discharged the day of surgery will not be allowed to drive home.  Name and phone number of your driver: family  Special Instructions: N/A   Please read over the following fact sheets that you were given: Pain Booklet, Surgical Site Infection Prevention, Anesthesia Post-op Instructions and Care and Recovery After Surgery PATIENT INSTRUCTIONS POST-ANESTHESIA  IMMEDIATELY FOLLOWING SURGERY:  Do not drive or operate machinery for the first twenty four hours after surgery.  Do not make any important decisions for twenty four hours after surgery or while taking narcotic pain medications or sedatives.  If you develop intractable nausea and vomiting or a severe headache please notify your doctor immediately.  FOLLOW-UP:  Please make an appointment with your surgeon as instructed. You do not need to follow up with anesthesia unless specifically instructed to do so.  WOUND CARE INSTRUCTIONS (if applicable):  Keep a dry clean dressing on the anesthesia/puncture wound site if there is drainage.  Once the wound has quit draining you may leave it open to air.  Generally you should leave the bandage intact for  twenty four hours unless there is drainage.  If the epidural site drains for more than 36-48 hours please call the anesthesia department.  QUESTIONS?:  Please feel free to call your physician or the hospital operator if you have any questions, and they will be happy to assist you.     [facility] Anesthesia Department [address] [place] [phone]

## 2010-10-16 ENCOUNTER — Ambulatory Visit (HOSPITAL_COMMUNITY)
Admission: RE | Admit: 2010-10-16 | Discharge: 2010-10-16 | Disposition: A | Discharge status: Home / Self Care | Payer: Medicare HMO | Source: Ambulatory Visit | Attending: Ophthalmology | Admitting: Ophthalmology

## 2010-10-16 ENCOUNTER — Encounter (HOSPITAL_COMMUNITY): Payer: Self-pay | Admitting: Ophthalmology

## 2010-10-16 ENCOUNTER — Encounter (HOSPITAL_COMMUNITY)
Admission: RE | Disposition: A | Discharge status: Home / Self Care | Payer: Self-pay | Source: Ambulatory Visit | Attending: Ophthalmology

## 2010-10-16 ENCOUNTER — Encounter (HOSPITAL_COMMUNITY): Payer: Self-pay | Admitting: Anesthesiology

## 2010-10-16 ENCOUNTER — Ambulatory Visit (HOSPITAL_COMMUNITY): Payer: Medicare HMO | Admitting: Anesthesiology

## 2010-10-16 DIAGNOSIS — Z7982 Long term (current) use of aspirin: Secondary | ICD-10-CM | POA: Insufficient documentation

## 2010-10-16 DIAGNOSIS — H2589 Other age-related cataract: Secondary | ICD-10-CM | POA: Insufficient documentation

## 2010-10-16 DIAGNOSIS — Z01810 Encounter for preprocedural cardiovascular examination: Secondary | ICD-10-CM | POA: Insufficient documentation

## 2010-10-16 DIAGNOSIS — E119 Type 2 diabetes mellitus without complications: Secondary | ICD-10-CM | POA: Insufficient documentation

## 2010-10-16 DIAGNOSIS — Z01812 Encounter for preprocedural laboratory examination: Secondary | ICD-10-CM | POA: Insufficient documentation

## 2010-10-16 DIAGNOSIS — E7800 Pure hypercholesterolemia, unspecified: Secondary | ICD-10-CM | POA: Insufficient documentation

## 2010-10-16 HISTORY — PX: CATARACT EXTRACTION W/PHACO: SHX586

## 2010-10-16 LAB — GLUCOSE, CAPILLARY: Glucose-Capillary: 110 mg/dL — ABNORMAL HIGH (ref 70–99)

## 2010-10-16 SURGERY — PHACOEMULSIFICATION, CATARACT, WITH IOL INSERTION
Anesthesia: Monitor Anesthesia Care | Site: Eye | Laterality: Left | Wound class: Clean

## 2010-10-16 MED ORDER — LACTATED RINGERS IV SOLN
INTRAVENOUS | Status: DC
  Administered 2010-10-16: 08:00:00 via INTRAVENOUS

## 2010-10-16 MED ORDER — CYCLOPENTOLATE-PHENYLEPHRINE 0.2-1 % OP SOLN
1.0000 [drp] | OPHTHALMIC | Status: AC
  Administered 2010-10-16 (×3): 1 [drp] via OPHTHALMIC

## 2010-10-16 MED ORDER — NEOMYCIN-POLYMYXIN-DEXAMETH 3.5-10000-0.1 OP OINT
TOPICAL_OINTMENT | OPHTHALMIC | Status: AC
  Filled 2010-10-16: qty 3.5

## 2010-10-16 MED ORDER — EPINEPHRINE HCL 1 MG/ML IJ SOLN
INTRAMUSCULAR | Status: AC
  Filled 2010-10-16: qty 1

## 2010-10-16 MED ORDER — CYCLOPENTOLATE-PHENYLEPHRINE 0.2-1 % OP SOLN
OPHTHALMIC | Status: AC
  Administered 2010-10-16: 1 [drp] via OPHTHALMIC
  Filled 2010-10-16: qty 2

## 2010-10-16 MED ORDER — LACTATED RINGERS IV SOLN
INTRAVENOUS | Status: DC | PRN
  Administered 2010-10-16: 08:00:00 via INTRAVENOUS

## 2010-10-16 MED ORDER — NEOMYCIN-POLYMYXIN-DEXAMETH 0.1 % OP OINT
TOPICAL_OINTMENT | OPHTHALMIC | Status: DC | PRN
  Administered 2010-10-16: 1 via OPHTHALMIC

## 2010-10-16 MED ORDER — LIDOCAINE 3.5 % OP GEL OPTIME - NO CHARGE
OPHTHALMIC | Status: DC | PRN
  Administered 2010-10-16: 1 [drp] via OPHTHALMIC

## 2010-10-16 MED ORDER — PHENYLEPHRINE HCL 2.5 % OP SOLN
OPHTHALMIC | Status: AC
  Administered 2010-10-16: 1 [drp] via OPHTHALMIC
  Filled 2010-10-16: qty 2

## 2010-10-16 MED ORDER — LIDOCAINE HCL (PF) 1 % IJ SOLN
INTRAMUSCULAR | Status: DC | PRN
  Administered 2010-10-16: .3 mL

## 2010-10-16 MED ORDER — TETRACAINE HCL 0.5 % OP SOLN
OPHTHALMIC | Status: AC
  Administered 2010-10-16: 1 [drp] via OPHTHALMIC
  Filled 2010-10-16: qty 2

## 2010-10-16 MED ORDER — TETRACAINE HCL 0.5 % OP SOLN
1.0000 [drp] | OPHTHALMIC | Status: AC
  Administered 2010-10-16 (×3): 1 [drp] via OPHTHALMIC

## 2010-10-16 MED ORDER — LIDOCAINE HCL (PF) 1 % IJ SOLN
INTRAMUSCULAR | Status: AC
  Filled 2010-10-16: qty 2

## 2010-10-16 MED ORDER — EPINEPHRINE HCL 1 MG/ML IJ SOLN
INTRAOCULAR | Status: DC | PRN
  Administered 2010-10-16: 09:00:00

## 2010-10-16 MED ORDER — LIDOCAINE HCL 3.5 % OP GEL
OPHTHALMIC | Status: AC
  Administered 2010-10-16: 1 via OPHTHALMIC
  Filled 2010-10-16: qty 5

## 2010-10-16 MED ORDER — LIDOCAINE HCL 3.5 % OP GEL
1.0000 "application " | Freq: Once | OPHTHALMIC | Status: AC
  Administered 2010-10-16: 1 via OPHTHALMIC

## 2010-10-16 MED ORDER — MIDAZOLAM HCL 2 MG/2ML IJ SOLN
INTRAMUSCULAR | Status: AC
  Filled 2010-10-16: qty 2

## 2010-10-16 MED ORDER — PHENYLEPHRINE HCL 2.5 % OP SOLN
1.0000 [drp] | OPHTHALMIC | Status: AC
  Administered 2010-10-16 (×3): 1 [drp] via OPHTHALMIC

## 2010-10-16 MED ORDER — POVIDONE-IODINE 5 % OP SOLN
OPHTHALMIC | Status: DC | PRN
  Administered 2010-10-16: 1 via OPHTHALMIC

## 2010-10-16 MED ORDER — MIDAZOLAM HCL 2 MG/2ML IJ SOLN
1.0000 mg | INTRAMUSCULAR | Status: DC | PRN
  Administered 2010-10-16: 2 mg via INTRAVENOUS

## 2010-10-16 MED ORDER — PROVISC 10 MG/ML IO SOLN
INTRAOCULAR | Status: DC | PRN
  Administered 2010-10-16: 8.5 mg via OPHTHALMIC

## 2010-10-16 MED ORDER — BSS IO SOLN
INTRAOCULAR | Status: DC | PRN
  Administered 2010-10-16: 15 mL via OPHTHALMIC

## 2010-10-16 SURGICAL SUPPLY — 34 items
CAPSULAR TENSION RING-AMO (OPHTHALMIC RELATED) IMPLANT
CLOTH BEACON ORANGE TIMEOUT ST (SAFETY) ×2 IMPLANT
DUOVISC SYSTEM (INTRAOCULAR LENS)
EYE SHIELD UNIVERSAL CLEAR (GAUZE/BANDAGES/DRESSINGS) ×2 IMPLANT
GLOVE BIO SURGEON STRL SZ 6.5 (GLOVE) IMPLANT
GLOVE BIOGEL PI IND STRL 6.5 (GLOVE) ×1 IMPLANT
GLOVE BIOGEL PI IND STRL 7.0 (GLOVE) IMPLANT
GLOVE BIOGEL PI IND STRL 7.5 (GLOVE) IMPLANT
GLOVE BIOGEL PI INDICATOR 6.5 (GLOVE) ×1
GLOVE BIOGEL PI INDICATOR 7.0 (GLOVE)
GLOVE BIOGEL PI INDICATOR 7.5 (GLOVE)
GLOVE ECLIPSE 6.5 STRL STRAW (GLOVE) IMPLANT
GLOVE ECLIPSE 7.0 STRL STRAW (GLOVE) IMPLANT
GLOVE ECLIPSE 7.5 STRL STRAW (GLOVE) IMPLANT
GLOVE EXAM NITRILE LRG STRL (GLOVE) IMPLANT
GLOVE EXAM NITRILE MD LF STRL (GLOVE) ×2 IMPLANT
GLOVE SKINSENSE NS SZ6.5 (GLOVE)
GLOVE SKINSENSE NS SZ7.0 (GLOVE)
GLOVE SKINSENSE STRL SZ6.5 (GLOVE) IMPLANT
GLOVE SKINSENSE STRL SZ7.0 (GLOVE) IMPLANT
KIT VITRECTOMY (OPHTHALMIC RELATED) IMPLANT
PAD ARMBOARD 7.5X6 YLW CONV (MISCELLANEOUS) ×2 IMPLANT
PROC W NO LENS (INTRAOCULAR LENS)
PROC W SPEC LENS (INTRAOCULAR LENS)
PROCESS W NO LENS (INTRAOCULAR LENS) IMPLANT
PROCESS W SPEC LENS (INTRAOCULAR LENS) IMPLANT
RING MALYGIN (MISCELLANEOUS) IMPLANT
SIGHTPATH CAT PROC W REG LENS (Ophthalmic Related) ×2 IMPLANT
SYR TB 1ML LL NO SAFETY (SYRINGE) ×2 IMPLANT
SYSTEM DUOVISC (INTRAOCULAR LENS) IMPLANT
TAPE SURG TRANSPORE 1 IN (GAUZE/BANDAGES/DRESSINGS) ×1 IMPLANT
TAPE SURGICAL TRANSPORE 1 IN (GAUZE/BANDAGES/DRESSINGS) ×1
VISCOELASTIC ADDITIONAL (OPHTHALMIC RELATED) IMPLANT
WATER STERILE IRR 250ML POUR (IV SOLUTION) ×2 IMPLANT

## 2010-10-16 NOTE — H&P (Signed)
I have evaluated the patient preoperatively, and have identified no interval changes in medical condition and plan of care since the history and physical of record 

## 2010-10-16 NOTE — Anesthesia Postprocedure Evaluation (Signed)
  Anesthesia Post-op Note  Patient: [name]  Procedure(s) Performed:  CATARACT EXTRACTION PHACO AND INTRAOCULAR LENS PLACEMENT (IOC) - CDE: 6.99  Patient Location: PACU and Short Stay  Anesthesia Type: MAC  Level of Consciousness: awake, alert , oriented and patient cooperative  Airway and Oxygen Therapy: Patient Spontanous Breathing  Post-op Pain: none  Post-op Assessment: Post-op Vital signs reviewed, Patient's Cardiovascular Status Stable, Respiratory Function Stable, Patent Airway and Adequate PO intake  Post-op Vital Signs: Reviewed and stable  Complications: No apparent anesthesia complications

## 2010-10-16 NOTE — Op Note (Signed)
NAMERUSTI, ARIZMENDI              ACCOUNT NO.:  [id number]  MEDICAL RECORD NO.:  [id number]  LOCATION:  APPO                          FACILITY:  [facility]  PHYSICIAN:  Susanne Greenhouse, MD       DATE OF BIRTH:  03-02-1945  DATE OF PROCEDURE:  10/16/2010 DATE OF DISCHARGE:  10/16/2010                              OPERATIVE REPORT   PREOPERATIVE DIAGNOSIS:  Combined cataract, left eye.  Diagnosis code 366.19.  POSTOPERATIVE DIAGNOSIS:  Combined cataract, left eye.  Diagnosis code 366.19.  OPERATION PERFORMED:  Phacoemulsification with posterior chamber intraocular lens implantation, left eye.  SURGEON:  Susanne Greenhouse, MD.  ANESTHESIA:  General endotracheal anesthesia.  OPERATIVE SUMMARY:  In the preoperative area, dilating drops were placed into the left eye.  The patient was then brought into the operating room where she was placed under general anesthesia.  The eye was then prepped and draped.  Beginning with a 75 blade, a paracentesis port was made at the surgeon's 2 o'clock position.  The anterior chamber was then filled with a 1% nonpreserved lidocaine solution with epinephrine.  This was followed by Viscoat to deepen the chamber.  A small fornix-based peritomy was performed superiorly.  Next, a single iris hook was placed through the limbus superiorly.  A 2.4-mm keratome blade was then used to make a clear corneal incision over the iris hook.  A bent cystotome needle and Utrata forceps were used to create a continuous tear capsulotomy.  Hydrodissection was performed using balanced salt solution on a fine cannula.  The lens nucleus was then removed using phacoemulsification in a quadrant cracking technique.  The cortical material was then removed with irrigation and aspiration.  The capsular bag and anterior chamber were refilled with Provisc.  The wound was widened to approximately 3 mm and a posterior chamber intraocular lens was placed into the capsular bag without difficulty  using an [employer] lens injecting system.  A single 10-0 nylon suture was then used to close the incision as well as stromal hydration.  The Provisc was removed from the anterior chamber and capsular bag with irrigation and aspiration.  At this point, the wounds were tested for leak, which were negative.  The anterior chamber remained deep and stable.  The patient tolerated the procedure well.  There were no operative complications, and she awoke from general anesthesia without problem.  No surgical specimens.  PROSTHETIC DEVICE USED:  A Lenstec posterior chamber lens, Softec HD, power of 18.0, serial number is [id number].          ______________________________ Susanne Greenhouse, MD     KEH/MEDQ  D:  10/16/2010  T:  10/16/2010  Job:  [id number]

## 2010-10-16 NOTE — Brief Op Note (Signed)
Pre-Op Dx: Cataract OS Post-Op Dx: Cataract OS Surgeon: Placido Hangartner Anesthesia: Topical with MAC Implant: Lenstec, Model Softec HD Specimen: None Complications: None 

## 2010-10-16 NOTE — Anesthesia Preprocedure Evaluation (Addendum)
Anesthesia Evaluation  Name, MR# and DOB Patient awake  General Assessment Comment  Reviewed: Allergy & Precautions, H&P , NPO status , Patient's Chart, lab work & pertinent test results  History of Anesthesia Complications Negative for: history of anesthetic complications  Airway Mallampati: I      Dental  (+) Teeth Intact and Partial Upper   Pulmonary  asthmaCOPD    pulmonary exam normalPulmonary Exam Normal     Cardiovascular Regular Normal    Neuro/Psych    (+) Anxiety, Depression,    GI/Hepatic/Renal       hiatal hernia,       Endo/Other  (+) Diabetes mellitus-, Well Controlled, Type 2, Oral Hypoglycemic Agents,     Abdominal   Musculoskeletal   Hematology   Peds  Reproductive/Obstetrics    Anesthesia Other Findings             Anesthesia Physical Anesthesia Plan  ASA: III  Anesthesia Plan: MAC   Post-op Pain Management:    Induction: Intravenous  Airway Management Planned: Nasal Cannula  Additional Equipment:   Intra-op Plan:   Post-operative Plan:   Informed Consent: I have reviewed the patients History and Physical, chart, labs and discussed the procedure including the risks, benefits and alternatives for the proposed anesthesia with the patient or authorized representative who has indicated his/her understanding and acceptance.     Plan Discussed with:   Anesthesia Plan Comments:         Anesthesia Quick Evaluation

## 2010-10-16 NOTE — Transfer of Care (Signed)
Immediate Anesthesia Transfer of Care Note  Patient: [name]  Procedure(s) Performed:  CATARACT EXTRACTION PHACO AND INTRAOCULAR LENS PLACEMENT (IOC) - CDE: 6.99  Patient Location: PACU and Short Stay  Anesthesia Type: MAC  Level of Consciousness: awake, alert , oriented and patient cooperative  Airway & Oxygen Therapy: Patient Spontanous Breathing  Post-op Assessment: Report given to PACU RN, Post -op Vital signs reviewed and stable and Patient moving all extremities  Post vital signs: Reviewed and stable  Complications: No apparent anesthesia complications

## 2010-10-17 LAB — TYPE AND SCREEN: Antibody Screen: NEGATIVE

## 2010-10-17 LAB — DIFFERENTIAL
Basophils Relative: 1
Eosinophils Absolute: 0.5
Eosinophils Relative: 6 — ABNORMAL HIGH
Lymphs Abs: 3
Monocytes Absolute: 0.5
Monocytes Relative: 5
Neutrophils Relative %: 58

## 2010-10-17 LAB — CBC
HCT: 34.7 — ABNORMAL LOW
Hemoglobin: 11.9 — ABNORMAL LOW
MCHC: 34.1
MCV: 88.8
RBC: 3.91
WBC: 9.9

## 2010-10-17 LAB — BASIC METABOLIC PANEL
CO2: 29
Chloride: 102
GFR calc Af Amer: >60
Potassium: 5.2 — ABNORMAL HIGH

## 2010-10-19 ENCOUNTER — Encounter (HOSPITAL_COMMUNITY): Payer: Self-pay | Admitting: Ophthalmology

## 2010-12-06 ENCOUNTER — Encounter (HOSPITAL_COMMUNITY): Payer: Self-pay | Admitting: Pharmacy Technician

## 2010-12-13 ENCOUNTER — Encounter (HOSPITAL_COMMUNITY)
Admission: RE | Admit: 2010-12-13 | Discharge: 2010-12-13 | Disposition: A | Discharge status: Home / Self Care | Payer: Medicare HMO | Source: Ambulatory Visit | Attending: Ophthalmology | Admitting: Ophthalmology

## 2010-12-13 ENCOUNTER — Encounter (HOSPITAL_COMMUNITY): Payer: Self-pay

## 2010-12-13 ENCOUNTER — Other Ambulatory Visit: Payer: Self-pay

## 2010-12-13 HISTORY — DX: Bursopathy, unspecified: M71.9

## 2010-12-13 HISTORY — DX: Shortness of breath: R06.02

## 2010-12-13 LAB — BASIC METABOLIC PANEL
CO2: 29 mEq/L (ref 19–32)
Chloride: 100 mEq/L (ref 96–112)
Creatinine, Ser: 0.93 mg/dL (ref 0.50–1.10)
GFR calc Af Amer: 74 mL/min — ABNORMAL LOW (ref >90)
Potassium: 4.2 mEq/L (ref 3.5–5.1)
Sodium: 139 mEq/L (ref 135–145)

## 2010-12-13 LAB — CBC
MCV: 88.1 fL (ref 78.0–100.0)
Platelets: 295 10*3/uL (ref 150–400)
RBC: 3.96 MIL/uL (ref 3.87–5.11)
RDW: 13 % (ref 11.5–15.5)
WBC: 11.2 10*3/uL — ABNORMAL HIGH (ref 4.0–10.5)

## 2010-12-13 NOTE — Patient Instructions (Addendum)
20 [name]  12/13/2010   Your procedure is scheduled on:  12/18/2010  Report to [facility] at 07:00 AM.  Call this number if you have problems the morning of surgery: [phone]   Remember:   Do not eat food:After Midnight.  Do not drink clear liquids: After Midnight.  Take these medicines the morning of surgery with A SIP OF WATER: Detrol, Effexor, Trazodone. Take your albuterol before you come.   Do not wear jewelry, make-up or nail polish.  Do not wear lotions, powders, or perfumes. You may wear deodorant.  Do not shave 48 hours prior to surgery.  Do not bring valuables to the hospital.  Contacts, dentures or bridgework may not be worn into surgery.  Leave suitcase in the car. After surgery it may be brought to your room.  For patients admitted to the hospital, checkout time is 11:00 AM the day of discharge.   Patients discharged the day of surgery will not be allowed to drive home.  Name and phone number of your driver:   Special Instructions: N/A   Please read over the following fact sheets that you were given: Anesthesia Post-op Instructions      Cataract A cataract is a clouding of the lens of the eye. It is most often related to aging. A cataract is not a "film" over the surface of the eye. The lens is inside the eye and changes size of the pupil. The lens can enlarge to let more light enter the eye in dark environments and contract the size of the pupil to let in bright light. The lens is the part of the eye that helps focus light on the retina. The retina is the eye's light-sensitive layer. It is in the back of the eye that sends visual signals to the brain. In a normal eye, light passes through the lens and gets focused on the retina. To help produce a sharp image, the lens must remain clear. When a lens becomes cloudy, vision is compromised by the degree and nature of the clouding. Certain cataracts make people more near-sighted as they develop, others increase  glare, and all reduce vision to some degree or another. A cataract that is so dense that it becomes milky white and a white opacity can be seen through the pupil. When the white color is seen, it is called a "mature" or "hyper-mature cataract." Such cataracts cause total blindness in the affected eye. The cataract must be removed to prevent damage to the eye itself. Some types of cataracts can cause a secondary disease of the eye, such as certain types of glaucoma. In the early stages, better lighting and eyeglasses may lessen vision problems caused by cataracts. At a certain point, surgery may be needed to improve vision. CAUSES   Aging. However, cataracts may occur at any age, even in newborns.   Certain drugs.   Trauma to the eye.   Certain diseases (such as diabetes).   Inherited or acquired medical syndromes.  SYMPTOMS   Gradual, progressive drop in vision in the affected eye. Cataracts may develop at different rates in each eye. Cataracts may even be in just one eye with the other unaffected.   Cataracts due to trauma may develop quickly, sometimes over a matter or days or even hours. The result is severe and rapid visual loss.  DIAGNOSIS  To detect a cataract, an eye doctor examines the lens. A well developed cataract can be diagnosed without dilating the pupil. Early cataracts  and others of a specific nature are best diagnosed with an exam of the eyes with the pupils dilated by drops. TREATMENT   For an early cataract, vision may improve by using different eyeglasses or stronger lighting.   If the above measures do not help, surgery is the only effective treatment. This treatment removes the cloudy lens and replaces it with a substitute lens (Intraocular lens, or IOL). Newly developed IOL technology allows the implanted lens to improve vision both at a distance and up close. Discuss with your eye surgeon about the possibility of still needing glasses. Also discuss how visual  coordination between both eyes will be affected.  A cataract needs to be removed only when vision loss interferes with your everyday activities such as driving, reading or watching TV. You and your eye doctor can make that decision together. In most cases, waiting until you are ready to have cataract surgery will not harm your eye. If you have cataracts in both eyes, only one should be removed at a time. This allows the operated eye to heal and be out of danger from serious problems (such as infection or poor wound healing) before having the other eye undergo surgery.  Sometimes, a cataract should be removed even if it does not cause problems with your vision. For example, a cataract should be removed if it prevents examination or treatment of another eye problem. Just as you cannot see out of the affected eye well, your doctor cannot see into your eye well through a cataract. The vast majority of people who have cataract surgery have better vision afterward. CATARACT REMOVAL There are two primary ways to remove a cataract. Your doctor can explain the differences and help determine which is best for you:  Phacoemulsification (small incision cataract surgery). This involves making a small cut (incision) on the edge of the clear, dome-shaped surface that covers the front of the eye (the cornea). An injection behind the eye or eye drops are given to make this a painless procedure. The doctor then inserts a tiny probe into the eye. This device emits ultrasound waves that soften and break up the cloudy center of the lens so it can be removed by suction. Most cataract surgery is done this way. The cuts are usually so small and performed in such a manner that often no sutures are needed to keep it closed.   Extracapsular surgery. Your doctor makes a slightly longer incision on the side of the cornea. The doctor removes the hard center of the lens. The remainder of the lens is then removed by suction. In some  cases, extremely fine sutures are needed which the doctor may, or may not remove in the office after the surgery.  When an IOL is implanted, it needs no care. It becomes a permanent part of your eye and cannot be seen or felt.  Some people cannot have an IOL. They may have problems during surgery, or maybe they have another eye disease. For these people, a soft contact lens may be suggested. If an IOL or contact lens cannot be used, very powerful and thick glasses are required after surgery. Since vision is very different through such thick glasses, it is important to have your doctor discuss the impact on your vision after any cataract surgery where there is no plan to implant an IOL. The normal lens of the eye is covered by a clear capsule. Both phacoemulsification and extracapsular surgery require that the back surface of this lens capsule  be left in place. This helps support IOLs and prevents the IOL from dislocating and falling back into the deeper interior of the eye. Right after surgery, and often permanently this "posterior capsule" remains clear. In some cases however, it can become cloudy, presenting the same type of visual compromise that the original cataract did since light is again obstructed as it passes through the clear IOL. This condition is often referred to as an "after-cataract." Fortunately, after-cataracts are easily treated using a painless and very fast laser treatment that is performed without anesthesia or incisions. It is done in a matter of minutes in an outpatient environment. Visual improvement is often immediate.  HOME CARE INSTRUCTIONS   Your surgeon will discuss pre and post operative care with you prior to surgery. The majority of people are able to do almost all normal activities right away. Although, it is often advised to avoid strenuous activity for a period of time.   Postoperative drops and careful avoidance of infection will be needed. Many surgeons suggest the use  of a protective shield during the first few days after surgery.   There is a very small incidence of complication from modern cataract surgery, but it can happen. Infection that spreads to the inside of the eye (endophthalmitis) can result in total visual loss and even loss of the eye itself. In extremely rare instances, the inflammation of endophthalmitis can spread to both eyes (sympathetic ophthalmia). Appropriate post-operative care under the close observation of your surgeon is essential to a successful outcome.  SEEK IMMEDIATE MEDICAL CARE IF:   You have any sudden drop of vision in the operated eye.   You have pain in the operated eye.   You see a large number of floating dots in the field of vision in the operated eye.   You see flashing lights, or if a portion of your side vision in any direction appears black (like a curtain being drawn into your field of vision) in the operated eye.  Document Released: 01/08/2005 Document Revised: 09/20/2010 Document Reviewed: 02/24/2007 [facility] Patient Information 2012 [place], [place].      PATIENT INSTRUCTIONS POST-ANESTHESIA  IMMEDIATELY FOLLOWING SURGERY:  Do not drive or operate machinery for the first twenty four hours after surgery.  Do not make any important decisions for twenty four hours after surgery or while taking narcotic pain medications or sedatives.  If you develop intractable nausea and vomiting or a severe headache please notify your doctor immediately.  FOLLOW-UP:  Please make an appointment with your surgeon as instructed. You do not need to follow up with anesthesia unless specifically instructed to do so.  WOUND CARE INSTRUCTIONS (if applicable):  Keep a dry clean dressing on the anesthesia/puncture wound site if there is drainage.  Once the wound has quit draining you may leave it open to air.  Generally you should leave the bandage intact for twenty four hours unless there is drainage.  If the epidural site drains for  more than 36-48 hours please call the anesthesia department.  QUESTIONS?:  Please feel free to call your physician or the hospital operator if you have any questions, and they will be happy to assist you.     [facility] Anesthesia Department [address] [place] [phone]

## 2010-12-18 ENCOUNTER — Encounter (HOSPITAL_COMMUNITY)
Admission: RE | Disposition: A | Discharge status: Home / Self Care | Payer: Self-pay | Source: Ambulatory Visit | Attending: Ophthalmology

## 2010-12-18 ENCOUNTER — Encounter (HOSPITAL_COMMUNITY): Payer: Self-pay | Admitting: Anesthesiology

## 2010-12-18 ENCOUNTER — Ambulatory Visit (HOSPITAL_COMMUNITY): Payer: Medicare HMO | Admitting: Anesthesiology

## 2010-12-18 ENCOUNTER — Ambulatory Visit (HOSPITAL_COMMUNITY)
Admission: RE | Admit: 2010-12-18 | Discharge: 2010-12-18 | Disposition: A | Discharge status: Home / Self Care | Payer: Medicare HMO | Source: Ambulatory Visit | Attending: Ophthalmology | Admitting: Ophthalmology

## 2010-12-18 ENCOUNTER — Encounter (HOSPITAL_COMMUNITY): Payer: Self-pay | Admitting: *Deleted

## 2010-12-18 DIAGNOSIS — Z01812 Encounter for preprocedural laboratory examination: Secondary | ICD-10-CM | POA: Insufficient documentation

## 2010-12-18 DIAGNOSIS — H2589 Other age-related cataract: Secondary | ICD-10-CM | POA: Insufficient documentation

## 2010-12-18 DIAGNOSIS — Z01810 Encounter for preprocedural cardiovascular examination: Secondary | ICD-10-CM | POA: Insufficient documentation

## 2010-12-18 DIAGNOSIS — E119 Type 2 diabetes mellitus without complications: Secondary | ICD-10-CM | POA: Insufficient documentation

## 2010-12-18 HISTORY — PX: CATARACT EXTRACTION W/PHACO: SHX586

## 2010-12-18 LAB — GLUCOSE, CAPILLARY: Glucose-Capillary: 97 mg/dL (ref 70–99)

## 2010-12-18 SURGERY — PHACOEMULSIFICATION, CATARACT, WITH IOL INSERTION
Anesthesia: Monitor Anesthesia Care | Site: Eye | Laterality: Right | Wound class: Clean

## 2010-12-18 MED ORDER — PHENYLEPHRINE HCL 2.5 % OP SOLN
1.0000 [drp] | OPHTHALMIC | Status: AC
  Administered 2010-12-18 (×3): 1 [drp] via OPHTHALMIC

## 2010-12-18 MED ORDER — MIDAZOLAM HCL 2 MG/2ML IJ SOLN
1.0000 mg | INTRAMUSCULAR | Status: DC | PRN
  Administered 2010-12-18: 2 mg via INTRAVENOUS

## 2010-12-18 MED ORDER — FENTANYL CITRATE 0.05 MG/ML IJ SOLN: 25.0000 ug | INTRAMUSCULAR | Status: DC | PRN

## 2010-12-18 MED ORDER — BSS IO SOLN
INTRAOCULAR | Status: DC | PRN
  Administered 2010-12-18: 15 mL via INTRAOCULAR

## 2010-12-18 MED ORDER — ONDANSETRON HCL 4 MG/2ML IJ SOLN: 4.0000 mg | Freq: Once | INTRAMUSCULAR | Status: DC | PRN

## 2010-12-18 MED ORDER — NEOMYCIN-POLYMYXIN-DEXAMETH 0.1 % OP OINT
TOPICAL_OINTMENT | OPHTHALMIC | Status: DC | PRN
  Administered 2010-12-18: 1 via OPHTHALMIC

## 2010-12-18 MED ORDER — PHENYLEPHRINE HCL 2.5 % OP SOLN
OPHTHALMIC | Status: AC
  Administered 2010-12-18: 1 [drp] via OPHTHALMIC
  Filled 2010-12-18: qty 2

## 2010-12-18 MED ORDER — EPINEPHRINE HCL 1 MG/ML IJ SOLN
INTRAOCULAR | Status: DC | PRN
  Administered 2010-12-18: 09:00:00

## 2010-12-18 MED ORDER — EPINEPHRINE HCL 1 MG/ML IJ SOLN
INTRAMUSCULAR | Status: AC
  Filled 2010-12-18: qty 1

## 2010-12-18 MED ORDER — POVIDONE-IODINE 5 % OP SOLN
OPHTHALMIC | Status: DC | PRN
  Administered 2010-12-18: 1 via OPHTHALMIC

## 2010-12-18 MED ORDER — PROVISC 10 MG/ML IO SOLN
INTRAOCULAR | Status: DC | PRN
  Administered 2010-12-18: 8.5 mg via INTRAOCULAR

## 2010-12-18 MED ORDER — TETRACAINE HCL 0.5 % OP SOLN
OPHTHALMIC | Status: AC
  Administered 2010-12-18: 1 [drp] via OPHTHALMIC
  Filled 2010-12-18: qty 2

## 2010-12-18 MED ORDER — NEOMYCIN-POLYMYXIN-DEXAMETH 3.5-10000-0.1 OP OINT
TOPICAL_OINTMENT | OPHTHALMIC | Status: AC
  Filled 2010-12-18: qty 3.5

## 2010-12-18 MED ORDER — CYCLOPENTOLATE-PHENYLEPHRINE 0.2-1 % OP SOLN
OPHTHALMIC | Status: AC
  Administered 2010-12-18: 1 [drp] via OPHTHALMIC
  Filled 2010-12-18: qty 2

## 2010-12-18 MED ORDER — LIDOCAINE HCL 3.5 % OP GEL: 1.0000 "application " | Freq: Once | OPHTHALMIC | Status: DC

## 2010-12-18 MED ORDER — TETRACAINE HCL 0.5 % OP SOLN
1.0000 [drp] | OPHTHALMIC | Status: AC
  Administered 2010-12-18 (×3): 1 [drp] via OPHTHALMIC

## 2010-12-18 MED ORDER — MIDAZOLAM HCL 2 MG/2ML IJ SOLN
INTRAMUSCULAR | Status: AC
  Administered 2010-12-18: 2 mg via INTRAVENOUS
  Filled 2010-12-18: qty 2

## 2010-12-18 MED ORDER — LIDOCAINE HCL (PF) 1 % IJ SOLN
INTRAMUSCULAR | Status: DC | PRN
  Administered 2010-12-18: .4 mL

## 2010-12-18 MED ORDER — LACTATED RINGERS IV SOLN
INTRAVENOUS | Status: DC
  Administered 2010-12-18: 08:00:00 via INTRAVENOUS

## 2010-12-18 MED ORDER — LIDOCAINE 3.5 % OP GEL OPTIME - NO CHARGE
OPHTHALMIC | Status: DC | PRN
  Administered 2010-12-18: 1 [drp] via OPHTHALMIC

## 2010-12-18 MED ORDER — LIDOCAINE HCL (PF) 1 % IJ SOLN
INTRAMUSCULAR | Status: AC
  Filled 2010-12-18: qty 2

## 2010-12-18 MED ORDER — LIDOCAINE HCL 3.5 % OP GEL
OPHTHALMIC | Status: AC
  Filled 2010-12-18: qty 5

## 2010-12-18 MED ORDER — CYCLOPENTOLATE-PHENYLEPHRINE 0.2-1 % OP SOLN
1.0000 [drp] | OPHTHALMIC | Status: AC
  Administered 2010-12-18 (×3): 1 [drp] via OPHTHALMIC

## 2010-12-18 SURGICAL SUPPLY — 32 items
CAPSULAR TENSION RING-AMO (OPHTHALMIC RELATED) ×2 IMPLANT
CLOTH BEACON ORANGE TIMEOUT ST (SAFETY) ×2 IMPLANT
EYE SHIELD UNIVERSAL CLEAR (GAUZE/BANDAGES/DRESSINGS) ×2 IMPLANT
GLOVE BIO SURGEON STRL SZ 6.5 (GLOVE) IMPLANT
GLOVE BIOGEL PI IND STRL 6.5 (GLOVE) ×1 IMPLANT
GLOVE BIOGEL PI IND STRL 7.0 (GLOVE) IMPLANT
GLOVE BIOGEL PI IND STRL 7.5 (GLOVE) IMPLANT
GLOVE BIOGEL PI INDICATOR 6.5 (GLOVE) ×1
GLOVE BIOGEL PI INDICATOR 7.0 (GLOVE)
GLOVE BIOGEL PI INDICATOR 7.5 (GLOVE)
GLOVE ECLIPSE 6.5 STRL STRAW (GLOVE) IMPLANT
GLOVE ECLIPSE 7.0 STRL STRAW (GLOVE) IMPLANT
GLOVE ECLIPSE 7.5 STRL STRAW (GLOVE) IMPLANT
GLOVE EXAM NITRILE LRG STRL (GLOVE) IMPLANT
GLOVE EXAM NITRILE MD LF STRL (GLOVE) ×2 IMPLANT
GLOVE SKINSENSE NS SZ6.5 (GLOVE)
GLOVE SKINSENSE NS SZ7.0 (GLOVE)
GLOVE SKINSENSE STRL SZ6.5 (GLOVE) IMPLANT
GLOVE SKINSENSE STRL SZ7.0 (GLOVE) IMPLANT
KIT VITRECTOMY (OPHTHALMIC RELATED) IMPLANT
PAD ARMBOARD 7.5X6 YLW CONV (MISCELLANEOUS) ×2 IMPLANT
PROC W NO LENS (INTRAOCULAR LENS)
PROC W SPEC LENS (INTRAOCULAR LENS)
PROCESS W NO LENS (INTRAOCULAR LENS) IMPLANT
PROCESS W SPEC LENS (INTRAOCULAR LENS) IMPLANT
RING MALYGIN (MISCELLANEOUS) IMPLANT
SIGHTPATH CAT PROC W REG LENS (Ophthalmic Related) ×2 IMPLANT
SYR TB 1ML LL NO SAFETY (SYRINGE) ×2 IMPLANT
TAPE SURG TRANSPORE 1 IN (GAUZE/BANDAGES/DRESSINGS) ×1 IMPLANT
TAPE SURGICAL TRANSPORE 1 IN (GAUZE/BANDAGES/DRESSINGS) ×1
VISCOELASTIC ADDITIONAL (OPHTHALMIC RELATED) IMPLANT
WATER STERILE IRR 250ML POUR (IV SOLUTION) ×2 IMPLANT

## 2010-12-18 NOTE — Anesthesia Preprocedure Evaluation (Addendum)
Anesthesia Evaluation  Patient identified by MRN, date of birth, ID band Patient awake    Reviewed: Allergy & Precautions, H&P , NPO status , Patient's Chart, lab work & pertinent test results  History of Anesthesia Complications Negative for: history of anesthetic complications  Airway Mallampati: I      Dental  (+) Teeth Intact and Partial Upper   Pulmonary asthma , COPD   Pulmonary exam normal       Cardiovascular Regular Normal    Neuro/Psych Anxiety Depression    GI/Hepatic hiatal hernia,   Endo/Other  Diabetes mellitus-, Well Controlled, Type 2, Oral Hypoglycemic Agents  Renal/GU      Musculoskeletal   Abdominal   Peds  Hematology   Anesthesia Other Findings   Reproductive/Obstetrics                           Anesthesia Physical Anesthesia Plan  ASA: III  Anesthesia Plan: MAC   Post-op Pain Management:    Induction:   Airway Management Planned: Nasal Cannula  Additional Equipment:   Intra-op Plan:   Post-operative Plan:   Informed Consent: I have reviewed the patients History and Physical, chart, labs and discussed the procedure including the risks, benefits and alternatives for the proposed anesthesia with the patient or authorized representative who has indicated his/her understanding and acceptance.     Plan Discussed with:   Anesthesia Plan Comments:         Anesthesia Quick Evaluation

## 2010-12-18 NOTE — Op Note (Signed)
NAMEGURNEET, [name]              ACCOUNT NO.:  [id number]  MEDICAL RECORD NO.:  [id number]  LOCATION:  APPO                          FACILITY:  [facility]  PHYSICIAN:  Susanne Greenhouse, MD       DATE OF BIRTH:  10-16-1945  DATE OF PROCEDURE:  12/18/2010 DATE OF DISCHARGE:                              OPERATIVE REPORT   PREOPERATIVE DIAGNOSIS:  Combined cataract, right eye.  Diagnosis code 366.19.  POSTOPERATIVE DIAGNOSIS:  Combined cataract, right eye.  Diagnosis code 366.19.  OPERATION PERFORMED:  Phacoemulsification with posterior chamber intraocular lens implantation, right eye.  SURGEON:  Bonne Dolores. Ajna Moors, MD  ANESTHESIA:  General endotracheal anesthesia.  OPERATIVE SUMMARY:  In the preoperative area, dilating drops were placed into the right eye.  The patient was then brought into the operating room where she was placed under general anesthesia.  The eye was then prepped and draped.  Beginning with a 75 blade, a paracentesis port was made at the surgeon's 2 o'clock position.  The anterior chamber was then filled with a 1% nonpreserved lidocaine solution with epinephrine.  This was followed by Viscoat to deepen the chamber.  A small fornix-based peritomy was performed superiorly.  Next, a single iris hook was placed through the limbus superiorly.  A 2.4-mm keratome blade was then used to make a clear corneal incision over the iris hook.  A bent cystotome needle and Utrata forceps were used to create a continuous tear capsulotomy.  Hydrodissection was performed using balanced salt solution on a fine cannula.  The lens nucleus was then removed using phacoemulsification in a quadrant cracking technique.  The cortical material was then removed with irrigation and aspiration.  The capsular bag and anterior chamber were refilled with Provisc.  The wound was widened to approximately 3 mm and a posterior chamber intraocular lens was placed into the capsular bag without difficulty using an  [employer] lens injecting system.  A single 10-0 nylon suture was then used to close the incision as well as stromal hydration.  The Provisc was removed from the anterior chamber and capsular bag with irrigation and aspiration.  At this point, the wounds were tested for leak, which were negative.  The anterior chamber remained deep and stable.  The patient tolerated the procedure well.  There were no operative complications, and she awoke from general anesthesia without problem.  No surgical specimens.  Prosthetic device used is a Lenstec posterior chamber lens, model Softec HD, power of 18.0, serial number is [id number].          ______________________________ Susanne Greenhouse, MD     KEH/MEDQ  D:  12/18/2010  T:  12/18/2010  Job:  [id number]

## 2010-12-18 NOTE — Anesthesia Postprocedure Evaluation (Signed)
Immediate Anesthesia Transfer of Care Note  Patient: [name]  Procedure(s) Performed:  CATARACT EXTRACTION PHACO AND INTRAOCULAR LENS PLACEMENT (IOC) - CDE 12.50  Patient Location: Shortstay  Anesthesia Type: MAC  Level of Consciousness: awake  Airway & Oxygen Therapy: Patient Spontanous Breathing   Post-op Assessment: Report given to PACU RN, Post -op Vital signs reviewed and stable and Patient moving all extremities  Post vital signs: Reviewed and stable  Complications: No apparent anesthesia complications

## 2010-12-18 NOTE — H&P (Signed)
I have reviewed the H&P, the patient was re-examined, and I have identified no interval changes in medical condition and plan of care since the history and physical of record  

## 2010-12-18 NOTE — Brief Op Note (Signed)
Pre-Op Dx: Cataract OD Post-Op Dx: Cataract OD Surgeon: Tamu Golz Anesthesia: Topical with MAC Implant: Lenstec, Model Softec HD Blood Loss: None Specimen: None Complications: None 

## 2010-12-18 NOTE — Transfer of Care (Signed)
  Anesthesia Post-op Note  Patient: [name]  Procedure(s) Performed:  CATARACT EXTRACTION PHACO AND INTRAOCULAR LENS PLACEMENT (IOC) - CDE 12.50  Patient Location:  Short Stay  Anesthesia Type: MAC  Level of Consciousness: awake  Airway and Oxygen Therapy: Patient Spontanous Breathing  Post-op Pain: none  Post-op Assessment: Post-op Vital signs reviewed, Patient's Cardiovascular Status Stable, Respiratory Function Stable, Patent Airway, No signs of Nausea or vomiting and Pain level controlled  Post-op Vital Signs: Reviewed and stable  Complications: No apparent anesthesia complications

## 2010-12-18 NOTE — Anesthesia Procedure Notes (Signed)
Procedure Name: MAC Date/Time: 12/18/2010 8:29 AM Performed by: Minerva Areola Pre-anesthesia Checklist: Patient identified, Patient being monitored, Emergency Drugs available, Timeout performed and Suction available Patient Re-evaluated:Patient Re-evaluated prior to inductionOxygen Delivery Method: Nasal Cannula

## 2010-12-22 ENCOUNTER — Encounter (HOSPITAL_COMMUNITY): Payer: Self-pay | Admitting: Ophthalmology

## 2012-08-18 ENCOUNTER — Other Ambulatory Visit: Payer: Self-pay | Admitting: Family Medicine

## 2013-02-27 DIAGNOSIS — F419 Anxiety disorder, unspecified: Secondary | ICD-10-CM | POA: Insufficient documentation

## 2013-02-27 DIAGNOSIS — J309 Allergic rhinitis, unspecified: Secondary | ICD-10-CM | POA: Insufficient documentation

## 2013-12-17 ENCOUNTER — Emergency Department (HOSPITAL_COMMUNITY): Payer: Commercial Managed Care - HMO

## 2013-12-17 ENCOUNTER — Emergency Department (HOSPITAL_COMMUNITY)
Admission: EM | Admit: 2013-12-17 | Discharge: 2013-12-17 | Discharge status: Against Medical Advice | Payer: Commercial Managed Care - HMO | Attending: Emergency Medicine | Admitting: Emergency Medicine

## 2013-12-17 ENCOUNTER — Encounter (HOSPITAL_COMMUNITY): Payer: Self-pay | Admitting: Emergency Medicine

## 2013-12-17 DIAGNOSIS — R0600 Dyspnea, unspecified: Secondary | ICD-10-CM | POA: Diagnosis present

## 2013-12-17 DIAGNOSIS — Z8669 Personal history of other diseases of the nervous system and sense organs: Secondary | ICD-10-CM | POA: Diagnosis not present

## 2013-12-17 DIAGNOSIS — E780 Pure hypercholesterolemia: Secondary | ICD-10-CM | POA: Insufficient documentation

## 2013-12-17 DIAGNOSIS — R944 Abnormal results of kidney function studies: Secondary | ICD-10-CM | POA: Diagnosis not present

## 2013-12-17 DIAGNOSIS — Z720 Tobacco use: Secondary | ICD-10-CM | POA: Diagnosis not present

## 2013-12-17 DIAGNOSIS — Z79899 Other long term (current) drug therapy: Secondary | ICD-10-CM | POA: Insufficient documentation

## 2013-12-17 DIAGNOSIS — Z7982 Long term (current) use of aspirin: Secondary | ICD-10-CM | POA: Insufficient documentation

## 2013-12-17 DIAGNOSIS — Z872 Personal history of diseases of the skin and subcutaneous tissue: Secondary | ICD-10-CM | POA: Diagnosis not present

## 2013-12-17 DIAGNOSIS — J45901 Unspecified asthma with (acute) exacerbation: Secondary | ICD-10-CM | POA: Diagnosis not present

## 2013-12-17 DIAGNOSIS — J9801 Acute bronchospasm: Secondary | ICD-10-CM

## 2013-12-17 DIAGNOSIS — F329 Major depressive disorder, single episode, unspecified: Secondary | ICD-10-CM | POA: Insufficient documentation

## 2013-12-17 DIAGNOSIS — Z8719 Personal history of other diseases of the digestive system: Secondary | ICD-10-CM | POA: Insufficient documentation

## 2013-12-17 DIAGNOSIS — R0602 Shortness of breath: Secondary | ICD-10-CM

## 2013-12-17 DIAGNOSIS — F419 Anxiety disorder, unspecified: Secondary | ICD-10-CM | POA: Insufficient documentation

## 2013-12-17 DIAGNOSIS — R031 Nonspecific low blood-pressure reading: Secondary | ICD-10-CM | POA: Insufficient documentation

## 2013-12-17 DIAGNOSIS — E119 Type 2 diabetes mellitus without complications: Secondary | ICD-10-CM | POA: Insufficient documentation

## 2013-12-17 LAB — CBC WITH DIFFERENTIAL/PLATELET
BASOS ABS: 0.1 10*3/uL (ref 0.0–0.1)
BASOS PCT: 0 % (ref 0–1)
EOS ABS: 0.5 10*3/uL (ref 0.0–0.7)
EOS PCT: 4 % (ref 0–5)
HCT: 29.6 % — ABNORMAL LOW (ref 36.0–46.0)
Hemoglobin: 9.7 g/dL — ABNORMAL LOW (ref 12.0–15.0)
Lymphocytes Relative: 31 % (ref 12–46)
Lymphs Abs: 4.2 10*3/uL — ABNORMAL HIGH (ref 0.7–4.0)
MCH: 29.5 pg (ref 26.0–34.0)
MCHC: 32.8 g/dL (ref 30.0–36.0)
MCV: 90 fL (ref 78.0–100.0)
Monocytes Absolute: 1 10*3/uL (ref 0.1–1.0)
Monocytes Relative: 7 % (ref 3–12)
NEUTROS PCT: 58 % (ref 43–77)
Neutro Abs: 8.1 10*3/uL — ABNORMAL HIGH (ref 1.7–7.7)
Platelets: 290 10*3/uL (ref 150–400)
RBC: 3.29 MIL/uL — AB (ref 3.87–5.11)
RDW: 13.9 % (ref 11.5–15.5)
WBC: 13.9 10*3/uL — ABNORMAL HIGH (ref 4.0–10.5)

## 2013-12-17 LAB — I-STAT TROPONIN, ED: Troponin i, poc: 0.02 ng/mL (ref 0.00–0.08)

## 2013-12-17 LAB — I-STAT CHEM 8, ED
BUN: 33 mg/dL — ABNORMAL HIGH (ref 6–23)
CALCIUM ION: 1.25 mmol/L (ref 1.13–1.30)
Chloride: 105 mEq/L (ref 96–112)
Creatinine, Ser: 2 mg/dL — ABNORMAL HIGH (ref 0.50–1.10)
GLUCOSE: 98 mg/dL (ref 70–99)
HCT: 33 % — ABNORMAL LOW (ref 36.0–46.0)
HEMOGLOBIN: 11.2 g/dL — AB (ref 12.0–15.0)
Potassium: 5.9 mEq/L — ABNORMAL HIGH (ref 3.7–5.3)
Sodium: 138 mEq/L (ref 137–147)
TCO2: 21 mmol/L (ref 0–100)

## 2013-12-17 LAB — BASIC METABOLIC PANEL
Anion gap: 12 (ref 5–15)
BUN: 33 mg/dL — ABNORMAL HIGH (ref 6–23)
CO2: 24 mEq/L (ref 19–32)
Calcium: 9 mg/dL (ref 8.4–10.5)
Chloride: 102 mEq/L (ref 96–112)
Creatinine, Ser: 1.86 mg/dL — ABNORMAL HIGH (ref 0.50–1.10)
GFR, EST AFRICAN AMERICAN: 31 mL/min — AB (ref >90)
GFR, EST NON AFRICAN AMERICAN: 27 mL/min — AB (ref >90)
Glucose, Bld: 106 mg/dL — ABNORMAL HIGH (ref 70–99)
POTASSIUM: 5.7 meq/L — AB (ref 3.7–5.3)
SODIUM: 138 meq/L (ref 137–147)

## 2013-12-17 MED ORDER — IPRATROPIUM-ALBUTEROL 0.5-2.5 (3) MG/3ML IN SOLN
3.0000 mL | Freq: Once | RESPIRATORY_TRACT | Status: DC
  Filled 2013-12-17: qty 3

## 2013-12-17 MED ORDER — IPRATROPIUM BROMIDE 0.02 % IN SOLN
1.0000 mg | Freq: Once | RESPIRATORY_TRACT | Status: AC
  Administered 2013-12-17: 1 mg via RESPIRATORY_TRACT
  Filled 2013-12-17: qty 5

## 2013-12-17 MED ORDER — ALBUTEROL SULFATE (2.5 MG/3ML) 0.083% IN NEBU
2.5000 mg | INHALATION_SOLUTION | Freq: Once | RESPIRATORY_TRACT | Status: DC
  Filled 2013-12-17: qty 3

## 2013-12-17 MED ORDER — SODIUM CHLORIDE 0.9 % IV BOLUS (SEPSIS)
500.0000 mL | Freq: Once | INTRAVENOUS | Status: AC
  Administered 2013-12-17: 500 mL via INTRAVENOUS

## 2013-12-17 MED ORDER — ALBUTEROL SULFATE HFA 108 (90 BASE) MCG/ACT IN AERS
2.0000 | INHALATION_SPRAY | RESPIRATORY_TRACT | Status: AC
  Administered 2013-12-17: 2 via RESPIRATORY_TRACT
  Filled 2013-12-17: qty 6.7

## 2013-12-17 MED ORDER — ALBUTEROL (5 MG/ML) CONTINUOUS INHALATION SOLN
10.0000 mg/h | INHALATION_SOLUTION | Freq: Once | RESPIRATORY_TRACT | Status: AC
  Administered 2013-12-17: 10 mg/h via RESPIRATORY_TRACT
  Filled 2013-12-17: qty 20

## 2013-12-17 MED ORDER — PREDNISONE 50 MG PO TABS
60.0000 mg | ORAL_TABLET | Freq: Once | ORAL | Status: AC
  Administered 2013-12-17: 60 mg via ORAL
  Filled 2013-12-17 (×2): qty 1

## 2013-12-17 MED ORDER — PREDNISONE 20 MG PO TABS: 40.0000 mg | ORAL_TABLET | Freq: Every day | ORAL | Status: DC

## 2013-12-17 MED ORDER — SODIUM CHLORIDE 0.9 % IV SOLN
INTRAVENOUS | Status: DC
  Administered 2013-12-17: 16:00:00 via INTRAVENOUS

## 2013-12-17 NOTE — ED Notes (Signed)
Ambulated patient around the nurses station x2. Sats stayed 92-95 and heart rate 100. Made nurse aware.

## 2013-12-17 NOTE — ED Provider Notes (Signed)
CSN: [id number]     Arrival date & time 12/17/13  1421 History   First MD Initiated Contact with Patient 12/17/13 1439     Chief Complaint  Patient presents with  . Respiratory Distress      HPI Pt was seen at 1445.  Per pt, c/o gradual onset and worsening of persistent cough, wheezing and SOB for the past several weeks, worse today. Pt has been evaluated by her PMD for same, dx "URI," tx with antibiotic and prednisone last week without improvement. Denies CP/palpitations, no back pain, no abd pain, no N/V/D, no fevers, no rash.     Past Medical History  Diagnosis Date  . Anxiety   . Asthma   . Cataract   . Depression   . Diabetes mellitus   . Ulcer   . Hypercholesteremia   . Hiatal hernia   . Shortness of breath     chronic brochitis  . Bursitis    Past Surgical History  Procedure Laterality Date  . Neuroplasty / transposition median nerve at carpal tunnel bilateral    . Plantar fasc bilateral    . Rt foot reconstruction    . Right rotator cuff    . Left elbow tendon repair    . Fracture lower back    . Trigger thumb      Left  . Abdominal hysterectomy    . Cataract extraction w/phaco  10/16/2010    Procedure: CATARACT EXTRACTION PHACO AND INTRAOCULAR LENS PLACEMENT (IOC);  Surgeon: Tonny Branch;  Location: [facility];  Service: Ophthalmology;  Laterality: Left;  CDE: 6.99  . Cataract extraction w/phaco  12/18/2010    Procedure: CATARACT EXTRACTION PHACO AND INTRAOCULAR LENS PLACEMENT (IOC);  Surgeon: Tonny Branch;  Location: [facility];  Service: Ophthalmology;  Laterality: Right;  CDE 12.50   Family History  Problem Relation Age of Onset  . Breast cancer Daughter   . Anesthesia problems Neg Hx   . Hypotension Neg Hx   . Malignant hyperthermia Neg Hx   . Pseudochol deficiency Neg Hx    History  Substance Use Topics  . Smoking status: Current Every Day Smoker -- 1.00 packs/day for 52 years    Types: Cigarettes  . Smokeless tobacco: Not on file  . Alcohol Use: No     Review of Systems ROS: Statement: All systems negative except as marked or noted in the HPI; Constitutional: Negative for fever and chills. ; ; Eyes: Negative for eye pain, redness and discharge. ; ; ENMT: Negative for ear pain, hoarseness, nasal congestion, sinus pressure and sore throat. ; ; Cardiovascular: Negative for chest pain, palpitations, diaphoresis, and peripheral edema. ; ; Respiratory: +cough, wheezing, SOB. Negative for stridor. ; ; Gastrointestinal: Negative for nausea, vomiting, diarrhea, abdominal pain, blood in stool, hematemesis, jaundice and rectal bleeding. . ; ; Genitourinary: Negative for dysuria, flank pain and hematuria. ; ; Musculoskeletal: Negative for back pain and neck pain. Negative for swelling and trauma.; ; Skin: Negative for pruritus, rash, abrasions, blisters, bruising and skin lesion.; ; Neuro: Negative for headache, lightheadedness and neck stiffness. Negative for weakness, altered level of consciousness , altered mental status, extremity weakness, paresthesias, involuntary movement, seizure and syncope.      Allergies  Codeine and Flonase  Home Medications   Prior to Admission medications   Medication Sig Start Date End Date Taking? Authorizing Provider  aspirin EC 81 MG tablet Take 81 mg by mouth daily.   Yes Historical Provider, MD  atorvastatin (LIPITOR) 80 MG tablet  Take 1 tablet by mouth Daily.  04/25/10  Yes Historical Provider, MD  metFORMIN (GLUCOPHAGE) 500 MG tablet Take 500 mg by mouth 2 (two) times daily with a meal.     Yes Historical Provider, MD  traZODone (DESYREL) 50 MG tablet 150 mg at bedtime.  04/03/10  Yes Historical Provider, MD  venlafaxine (EFFEXOR) 75 MG tablet Take 75 mg by mouth daily.     Yes Historical Provider, MD  ALBUTEROL SULFATE HFA IN Inhale 2 puffs into the lungs 2 (two) times daily.     Historical Provider, MD   BP 103/58 mmHg  Pulse 83  Temp(Src) 97.7 F (36.5 C) (Oral)  Resp 18  Ht 5\' 6"  (1.676 m)  Wt 200 lb  (90.719 kg)  BMI 32.30 kg/m2  SpO2 100%   Filed Vitals:   12/17/13 1500 12/17/13 1542 12/17/13 1600 12/17/13 1700  BP: 115/89  96/38 100/38  Pulse: 82  71 72  Temp:      TempSrc:      Resp: 16  13 17   Height:      Weight:      SpO2: 98% 97% 94% 97%      14:34:22 Orthostatic Vital Signs DA  Orthostatic Lying  - BP- Lying: 97/69 mmHg ; Pulse- Lying: 89  Orthostatic Sitting - BP- Sitting: 105/92 mmHg ; Pulse- Sitting: 92  Orthostatic Standing at 0 minutes - BP- Standing at 0 minutes: 122/97 mmHg ; Pulse- Standing at 0 minutes: 96     Physical Exam  1450: Physical examination:  Nursing notes reviewed; Vital signs and O2 SAT reviewed;  Constitutional: Well developed, Well nourished, Well hydrated, In no acute distress; Head:  Normocephalic, atraumatic; Eyes: EOMI, PERRL, No scleral icterus; ENMT: Mouth and pharynx normal, Mucous membranes moist; Neck: Supple, Full range of motion, No lymphadenopathy; Cardiovascular: Regular rate and rhythm, No gallop; Respiratory: Breath sounds diminished & equal bilaterally, scattered wheezes. Occasional audible wheezing. Speaking sentences, Mildly tachypneic. Normal respiratory effort/excursion; Chest: Nontender, Movement normal; Abdomen: Soft, Nontender, Nondistended, Normal bowel sounds; Genitourinary: No CVA tenderness; Extremities: Pulses normal, No tenderness, No edema, No calf edema or asymmetry.; Neuro: AA&Ox3, Major CN grossly intact.  Speech clear. No gross focal motor or sensory deficits in extremities.; Skin: Color normal, Warm, Dry.   ED Course  Procedures     EKG Interpretation None      MDM  MDM Reviewed: previous chart, nursing note and vitals Reviewed previous: labs and ECG Interpretation: labs, ECG and x-ray      Date: 12/17/2013  Rate: 92  Rhythm: normal sinus rhythm  QRS Axis: normal  Intervals: normal  ST/T Wave abnormalities: normal  Conduction Disutrbances:none  Narrative Interpretation:   Old EKG Reviewed:  unchanged; no significant changes from previous EKG dated 12/13/2010.   Results for orders placed or performed during the hospital encounter of 12/17/13  CBC with Differential  Result Value Ref Range   WBC 13.9 (H) 4.0 - 10.5 K/uL   RBC 3.29 (L) 3.87 - 5.11 MIL/uL   Hemoglobin 9.7 (L) 12.0 - 15.0 g/dL   HCT 29.6 (L) 36.0 - 46.0 %   MCV 90.0 78.0 - 100.0 fL   MCH 29.5 26.0 - 34.0 pg   MCHC 32.8 30.0 - 36.0 g/dL   RDW 13.9 11.5 - 15.5 %   Platelets 290 150 - 400 K/uL   Neutrophils Relative % 58 43 - 77 %   Neutro Abs 8.1 (H) 1.7 - 7.7 K/uL   Lymphocytes Relative 31 12 -  46 %   Lymphs Abs 4.2 (H) 0.7 - 4.0 K/uL   Monocytes Relative 7 3 - 12 %   Monocytes Absolute 1.0 0.1 - 1.0 K/uL   Eosinophils Relative 4 0 - 5 %   Eosinophils Absolute 0.5 0.0 - 0.7 K/uL   Basophils Relative 0 0 - 1 %   Basophils Absolute 0.1 0.0 - 0.1 K/uL  Basic metabolic panel  Result Value Ref Range   Sodium 138 137 - 147 mEq/L   Potassium 5.7 (H) 3.7 - 5.3 mEq/L   Chloride 102 96 - 112 mEq/L   CO2 24 19 - 32 mEq/L   Glucose, Bld 106 (H) 70 - 99 mg/dL   BUN 33 (H) 6 - 23 mg/dL   Creatinine, Ser 1.86 (H) 0.50 - 1.10 mg/dL   Calcium 9.0 8.4 - 10.5 mg/dL   GFR calc non Af Amer 27 (L) >90 mL/min   GFR calc Af Amer 31 (L) >90 mL/min   Anion gap 12 5 - 15  I-stat chem 8, ed  Result Value Ref Range   Sodium 138 137 - 147 mEq/L   Potassium 5.9 (H) 3.7 - 5.3 mEq/L   Chloride 105 96 - 112 mEq/L   BUN 33 (H) 6 - 23 mg/dL   Creatinine, Ser 2.00 (H) 0.50 - 1.10 mg/dL   Glucose, Bld 98 70 - 99 mg/dL   Calcium, Ion 1.25 1.13 - 1.30 mmol/L   TCO2 21 0 - 100 mmol/L   Hemoglobin 11.2 (L) 12.0 - 15.0 g/dL   HCT 33.0 (L) 36.0 - 46.0 %  I-stat troponin, ED  Result Value Ref Range   Troponin i, poc 0.02 0.00 - 0.08 ng/mL   Comment 3           Dg Chest 2 View 12/17/2013   CLINICAL DATA:  68 year old female with recent history of wheezing and shortness of breath. Recently finished a course of antibiotics and  prednisone for upper respiratory tract infection.  EXAM: CHEST  2 VIEW  COMPARISON:  Chest CT 10/04/2007.  Chest x-ray 05/23/2005.  FINDINGS: Lung volumes are normal. No consolidative airspace disease. No pleural effusions. No pneumothorax. No pulmonary nodule or mass noted. Pulmonary vasculature and the cardiomediastinal silhouette are within normal limits.  IMPRESSION: No radiographic evidence of acute cardiopulmonary disease.   Electronically Signed   By: Vinnie Langton M.D.   On: 12/17/2013 15:25    1715:  IVF given for mildly elevated potassium. BUN/Cr elevated from baseline. SBP 90-100's, which does not appear to be pt's baseline. IVF bolus given. Pt has tol PO well while in the ED without N/V. Pt given hour long neb and PO prednisone with improvement in her wheezing/SOB. Pt's Sats 97-100% R/A. Pt denies any symptoms during orthostatic VS. Pt ambulated with O2 Sats dropping to 92-95% R/A. Pt and family informed re: dx testing results, including new BUN/Cr elevation, as well as low BP, and that I recommend observation admission for further evaluation.  Pt refuses admission.  I encouraged pt to stay, continues to refuse.  Pt makes her own medical decisions.  Risks of AMA explained to pt and friends, including, but not limited to:  stroke, heart attack, cardiac arrythmia ("irregular heart rate/beat"), "passing out," temporary and/or permanent disability, death.  Pt and friends verb understanding and continue to refuse admission, understanding the consequences of their decision.  I encouraged pt to follow up with her PMD tomorrow and return to the ED immediately if symptoms return, worsen, or for  any other concerns.  Pt and friends verb understanding, agreeable.   Francine Graven, DO 12/21/13 1428

## 2013-12-17 NOTE — ED Notes (Signed)
Pt finished antibiotic and prednisone for URI, had order for chest xray for Monday if not better. Pt got worse in the last, wheezing, sob

## 2013-12-17 NOTE — Discharge Instructions (Signed)
°Emergency Department Resource Guide °1) Find a Doctor and Pay Out of Pocket °Although you won't have to find out who is covered by your insurance plan, it is a good idea to ask around and get recommendations. You will then need to call the office and see if the doctor you have chosen will accept you as a new patient and what types of options they offer for patients who are self-pay. Some doctors offer discounts or will set up payment plans for their patients who do not have insurance, but you will need to ask so you aren't surprised when you get to your appointment. ° °2) Contact Your Local Health Department °Not all health departments have doctors that can see patients for sick visits, but many do, so it is worth a call to see if yours does. If you don't know where your local health department is, you can check in your phone book. The CDC also has a tool to help you locate your state's health department, and many state websites also have listings of all of their local health departments. ° °3) Find a Walk-in Clinic °If your illness is not likely to be very severe or complicated, you may want to try a walk in clinic. These are popping up all over the country in pharmacies, drugstores, and shopping centers. They're usually staffed by nurse practitioners or physician assistants that have been trained to treat common illnesses and complaints. They're usually fairly quick and inexpensive. However, if you have serious medical issues or chronic medical problems, these are probably not your best option. ° °No Primary Care Doctor: °- Call [facility] at  832-8000 - they can help you locate a primary care doctor that  accepts your insurance, provides certain services, etc. °- Physician Referral Service- 1-800-533-3463 ° °Chronic Pain Problems: °Organization         Address  Phone   Notes  °[facility]  (336) 297-2271 Patients need to be referred by their primary care doctor.  ° °Medication  Assistance: °Organization         Address  Phone   Notes  °[place] [facility] [address] °[place], [place] [address] (336) 641-8030 --Must be a resident of [place] °-- Must have NO insurance coverage whatsoever (no Medicaid/ Medicare, etc.) °-- The pt. MUST have a primary care doctor that directs their care regularly and follows them in the community °  °[facility]  (866) 331-1348   °[employer]  (888) 892-1162   ° °Agencies that provide inexpensive medical care: °Organization         Address  Phone   Notes  °[facility]  (336) 832-8035   °[facility]    (336) 832-7272   °[facility] [address] °[place], [place] [address] (336) 832-4777   °[facility] [address], °[place] (336) 271-4999   °[facility]    (336) 373-0678   °[facility]    (336) 272-1050   °[facility] ° [address], [place] Phone:  (336) 832-4444, Fax:  (336) 832-4440 Hours of Operation:  9 am - 6 pm, M-F.  Also accepts Medicaid/Medicare and self-pay.  °[facility] ° [address], [place] Phone: (336) 832-3150, Fax: (336) 832-3151. Hours of Operation:  8:30 am - 5:30 pm, M-F.  Also accepts Medicaid and self-pay.  °[facility] [place] [address], [place] Phone: (336) 878-6027   °[facility] [address], [place], [place] (336)723-1848, Ext. 123 Mondays & Thursdays: 7-9 AM.  First 15 patients are seen on a first come, first serve basis. °  ° °Medicaid-accepting [place] Providers: ° °Organization         Address  Phone   Notes  °[facility] [address], [place] (336) 641-2100 Also accepts self-pay patients.  °[facility] [address], [place] ° (336) 856-9996   °[facility] [address] [place] [address], [place]  (336) 288-8857   °[facility] [address] [place] [address], [place] (336) 299-7000   °Veita Bland [address], [place]  ° (336) 373-1557 Only accepts [place] Access Medicaid patients after they have their name applied to their card.  ° °Self-Pay (no insurance) in [place]: ° °Organization         Address  Phone   Notes  °Sickle Cell Patients, [facility] [address], [place] (336) 832-1970   °[facility] [address], [place] (336) 832-4400   °[facility] ° [address], [place] (336) 992-4800   °[facility]/Dr. Osei-Bonsu ° [address] [place] [address], [place] or [address], [place] (336) 841-8500 Phone number for both [place] and [place] locations is the same.  °[facility] [address], [place] (336) 299-0000   °[facility] [address] [place] [address], [place] or [address] (336) 852-7530 °(336) 878-2260   °[facility] [address], [place] (336) 350-1642, phone; (336) 294-5005, fax Sees patients 1st and 3rd Saturday of every month.  Must not qualify for public or private insurance (i.e. Medicaid, Medicare, [place] Health Choice, Veterans' Benefits) • Household income should be no more than 200% of the poverty level •The clinic cannot treat you if you are pregnant or think you are pregnant • Sexually transmitted diseases are not treated at the clinic.  ° ° °Dental Care: °Organization         Address  Phone  Notes  °[place] [facility] [address], [place] (336) 641-6152 Accepts children up to age 21 who are enrolled in Medicaid or [place] Health Choice; pregnant women with a Medicaid card; and children who have applied for Medicaid or [place] Health Choice, but were declined, whose parents can pay a reduced fee at time of service.  °[place]  [facility] [place]  [address], [place] (336) 641-7733 Accepts children up to age 21 who are enrolled in Medicaid or [place] Health Choice; pregnant women with a Medicaid card; and children who have applied for Medicaid or [place] Health Choice, but were declined, whose parents can pay a reduced fee at time of service.  °[facility] ° [address], [place] (336) 641-4533 Patients are seen by appointment only. Walk-ins are not accepted. [facility] will see patients 18 years of age and older. °Monday - Tuesday (8am-5pm) °Most Wednesdays (8:30-5pm) °$30 per visit, cash only  °[facility] ° [address], [place] (336) 641-4533 Patients are seen by appointment only. Walk-ins are not accepted. [facility] will see patients 18 years of age and older. °One   Wednesday Evening (Monthly: Volunteer Based).  $30 per visit, cash only  °[facility]  (919) 537-3737 for adults; Children under age 4, call Graduate Pediatric Dentistry at (919) [phone]. Children aged 4-14, please call (919) [phone] to request a pediatric application. ° Dental services are provided in all areas of dental care including fillings, crowns and bridges, complete and partial dentures, implants, gum treatment, root canals, and extractions. Preventive care is also provided. Treatment is provided to both adults and children. °Patients are selected via a lottery and there is often a waiting list. °  °[facility] [address], °[place] ° (336) 763-8833 [email or web address] °  °[facility] [address], [place], [place] (336)723-1848, Ext. 123 Second and Fourth Thursday of each month, opens at 6:30 AM; Clinic ends at 9 AM.  Patients are seen on a first-come first-served basis, and a limited number are seen during each clinic.  ° °[facility] ° [address], [place], [place] (336) 723-7904    Eligibility Requirements °You must have lived in [place], [place], or [place] counties for at least the last three months. °  You cannot be eligible for state or federal sponsored healthcare insurance, including Veterans Administration, Medicaid, or Medicare. °  You generally cannot be eligible for healthcare insurance through your employer.  °  How to apply: °Eligibility screenings are held every Tuesday and Wednesday afternoon from 1:00 pm until 4:00 pm. You do not need an appointment for the interview!  °[facility] [address], [place], [place] 336-631-2330   °[facility]  336-342-8273   °[facility]  336-703-3100   °[facility]  336-570-6415   ° °Behavioral Health Resources in the Community: °Intensive Outpatient Programs °Organization         Address  Phone  Notes  °[facility] [address], [place], [place] 336-878-6098   °[facility] [address], [place], [place] 336-832-9800   °[facility] [address], [place], [place] ° 336-882-2125   °[facility] [address],  °[place], [place] 1-800-853-5163 or 336-641-4981   °Substance Abuse Resources °Organization         Address  Phone  Notes  °[facility]  336-882-2125   °[facility]  336-784-9470   °[facility]  336-285-9073   °[facility]  336-845-3988   °Residential & Outpatient Substance Abuse Program  1-800-659-3381   °Psychological Services °Organization         Address  Phone  Notes  °[facility]  336- 832-9600   °[facility]  336- 378-7881   °[facility] [address], [place] 1-800-853-5163 or 336-641-4981   ° °Mobile Crisis Teams °Organization         Address  Phone  Notes  °[facility], Mobile Crisis Care Unit  1-877-626-1772   °Assertive °[facility] ° [address]  [place], [place] 336-834-9664   °Sharon DeEsch [address] °[place] 336-554-5454   ° °Self-Help/Support Groups °Organization         Address  Phone             Notes  °[facility] - variety of support groups  336- 373-1402 Call for more information  °Narcotics Anonymous (NA), [facility] [address], °[place]  2 meetings at this location  ° °  Residential Treatment Programs Organization         Address  Phone  Notes  [facility] [address],    [place]  1-779-726-1779   [facility]  [address], [place] [address], [place], [place]   [place], [place] (402) 120-3529 Admissions: 8am-3pm M-F  [facility] [address]    [place], [place] 436-067-7034   [facility] [address] [place], [place], [place]   [facility] [address]  [place], [place]   [facility] Intensive Outpatient [address], [place], [place]   [facility] ([place] [address]  [place], [place] 1-775-057-3008 or 7807746267   [facility] [address], [place], [place] Accepts Medicaid  [facility] [address]  [place] 1-(918) 292-1138 Substance Abuse/Addiction Treatment   [facility]         Address  Phone  Notes  [facility]  (609)304-3965   Domenic Schwab, PhD [address] [place], [place]   (680)559-0036 or [phone]   [place], [place] (724)466-3341   [facility] [address] [place], [place] [phone] Insurance/Medicaid/sponsorship through [facility] and Families [address]                                    [place], [place] 570-326-2891 [place] [address], [place] [phone]    Dr. Adele Schilder  [phone]   [facility] Dept. 1) [address] [place] 2) [place] 3)  [address], [place] 646-051-7599 [phone]  [phone]   [place] 4302532028 or [phone] (After Hours)      Take the prescription as directed.  Use your albuterol inhaler (2 to 4 puffs) every 4 hours for the next 7 days, then as needed for cough, wheezing, or shortness of breath. Increase your fluid intake for the next several days. Call your regular medical doctor tomorrow morning to schedule a follow up appointment within the next 2 days.  Return to the Emergency Department immediately sooner if worsening.

## 2014-02-01 DIAGNOSIS — I10 Essential (primary) hypertension: Secondary | ICD-10-CM | POA: Diagnosis not present

## 2014-02-01 DIAGNOSIS — M79622 Pain in left upper arm: Secondary | ICD-10-CM | POA: Diagnosis not present

## 2014-02-01 DIAGNOSIS — M25551 Pain in right hip: Secondary | ICD-10-CM | POA: Diagnosis not present

## 2014-02-01 DIAGNOSIS — M25561 Pain in right knee: Secondary | ICD-10-CM | POA: Diagnosis not present

## 2014-03-31 ENCOUNTER — Telehealth: Payer: Self-pay | Admitting: Family Medicine

## 2014-03-31 NOTE — Telephone Encounter (Signed)
Pt given new pt appt with Evelina Dun 4/11 at 10:40. Pt aware to arrive 15-30 minutes prior to appt and have a copy of insurance card and list of all current medications.

## 2014-05-03 ENCOUNTER — Ambulatory Visit: Payer: Medicare HMO | Admitting: Family

## 2014-10-05 ENCOUNTER — Other Ambulatory Visit (HOSPITAL_COMMUNITY)
Admission: RE | Admit: 2014-10-05 | Discharge: 2014-10-05 | Disposition: A | Discharge status: Home / Self Care | Payer: Commercial Managed Care - HMO | Source: Ambulatory Visit | Attending: Orthopaedic Surgery | Admitting: Orthopaedic Surgery

## 2014-10-05 DIAGNOSIS — M65341 Trigger finger, right ring finger: Secondary | ICD-10-CM | POA: Insufficient documentation

## 2014-12-23 ENCOUNTER — Ambulatory Visit: Payer: Medicare HMO | Admitting: Podiatry

## 2015-01-07 ENCOUNTER — Encounter: Payer: Self-pay | Admitting: Family Medicine

## 2015-01-07 DIAGNOSIS — E119 Type 2 diabetes mellitus without complications: Secondary | ICD-10-CM | POA: Insufficient documentation

## 2015-01-07 DIAGNOSIS — F329 Major depressive disorder, single episode, unspecified: Secondary | ICD-10-CM | POA: Insufficient documentation

## 2015-01-07 DIAGNOSIS — F32A Depression, unspecified: Secondary | ICD-10-CM | POA: Insufficient documentation

## 2015-01-07 DIAGNOSIS — J449 Chronic obstructive pulmonary disease, unspecified: Secondary | ICD-10-CM | POA: Insufficient documentation

## 2015-01-07 DIAGNOSIS — E7800 Pure hypercholesterolemia, unspecified: Secondary | ICD-10-CM | POA: Insufficient documentation

## 2015-01-07 DIAGNOSIS — I10 Essential (primary) hypertension: Secondary | ICD-10-CM | POA: Insufficient documentation

## 2015-01-12 ENCOUNTER — Ambulatory Visit (INDEPENDENT_AMBULATORY_CARE_PROVIDER_SITE_OTHER): Payer: Medicare HMO | Admitting: Physician Assistant

## 2015-01-12 ENCOUNTER — Encounter: Payer: Self-pay | Admitting: Physician Assistant

## 2015-01-12 VITALS — BP 126/76 | HR 84 | Temp 98.7°F | Resp 18 | Ht 64.5 in | Wt 199.0 lb

## 2015-01-12 DIAGNOSIS — Z23 Encounter for immunization: Secondary | ICD-10-CM

## 2015-01-12 DIAGNOSIS — E119 Type 2 diabetes mellitus without complications: Secondary | ICD-10-CM

## 2015-01-12 DIAGNOSIS — M79672 Pain in left foot: Secondary | ICD-10-CM

## 2015-01-12 DIAGNOSIS — F32A Depression, unspecified: Secondary | ICD-10-CM

## 2015-01-12 DIAGNOSIS — G4700 Insomnia, unspecified: Secondary | ICD-10-CM

## 2015-01-12 DIAGNOSIS — E7800 Pure hypercholesterolemia, unspecified: Secondary | ICD-10-CM

## 2015-01-12 DIAGNOSIS — J439 Emphysema, unspecified: Secondary | ICD-10-CM

## 2015-01-12 DIAGNOSIS — M79671 Pain in right foot: Secondary | ICD-10-CM | POA: Diagnosis not present

## 2015-01-12 DIAGNOSIS — I10 Essential (primary) hypertension: Secondary | ICD-10-CM

## 2015-01-12 DIAGNOSIS — G4733 Obstructive sleep apnea (adult) (pediatric): Secondary | ICD-10-CM

## 2015-01-12 DIAGNOSIS — M76899 Other specified enthesopathies of unspecified lower limb, excluding foot: Secondary | ICD-10-CM | POA: Diagnosis not present

## 2015-01-12 DIAGNOSIS — F17200 Nicotine dependence, unspecified, uncomplicated: Secondary | ICD-10-CM | POA: Insufficient documentation

## 2015-01-12 DIAGNOSIS — Z720 Tobacco use: Secondary | ICD-10-CM | POA: Diagnosis not present

## 2015-01-12 DIAGNOSIS — F329 Major depressive disorder, single episode, unspecified: Secondary | ICD-10-CM | POA: Diagnosis not present

## 2015-01-12 DIAGNOSIS — M25559 Pain in unspecified hip: Secondary | ICD-10-CM | POA: Diagnosis not present

## 2015-01-12 LAB — COMPLETE METABOLIC PANEL WITH GFR
ALK PHOS: 113 U/L (ref 33–130)
ALT: 15 U/L (ref 6–29)
AST: 17 U/L (ref 10–35)
Albumin: 4.2 g/dL (ref 3.6–5.1)
BUN: 18 mg/dL (ref 7–25)
CO2: 25 mmol/L (ref 20–31)
Calcium: 9.1 mg/dL (ref 8.6–10.4)
Chloride: 98 mmol/L (ref 98–110)
Creat: 1.17 mg/dL — ABNORMAL HIGH (ref 0.50–0.99)
GFR, EST AFRICAN AMERICAN: 56 mL/min — AB (ref >=60)
GFR, EST NON AFRICAN AMERICAN: 48 mL/min — AB (ref >=60)
Glucose, Bld: 86 mg/dL (ref 70–99)
Potassium: 5.3 mmol/L (ref 3.5–5.3)
SODIUM: 136 mmol/L (ref 135–146)
Total Bilirubin: 0.2 mg/dL (ref 0.2–1.2)
Total Protein: 7.3 g/dL (ref 6.1–8.1)

## 2015-01-12 LAB — HEMOGLOBIN A1C
Hgb A1c MFr Bld: 6.1 % — ABNORMAL HIGH (ref <5.7)
Mean Plasma Glucose: 128 mg/dL — ABNORMAL HIGH (ref <117)

## 2015-01-12 LAB — LIPID PANEL
Cholesterol: 263 mg/dL — ABNORMAL HIGH (ref 125–200)
HDL: 45 mg/dL — AB (ref >=46)
LDL CALC: 176 mg/dL — AB (ref <130)
TRIGLYCERIDES: 211 mg/dL — AB (ref <150)
Total CHOL/HDL Ratio: 5.8 Ratio — ABNORMAL HIGH (ref <=5.0)
VLDL: 42 mg/dL — ABNORMAL HIGH (ref <30)

## 2015-01-12 NOTE — Progress Notes (Signed)
Patient ID: [name] MRN: [id number], DOB: 1945/03/12, 69 y.o. Date of Encounter: @DATE @  Chief Complaint:  Chief Complaint  Patient presents with  . new pt est care    referral to foot doctor    HPI: 69 y.o. year old white female  presents as new patient to establish care.  She states that her doctor's office recently closed so she is transferring her care here. Says that her last office visit there was around September but her last labs were at least 6 months ago.  Asked if she sees any other medical providers on a routine basis. Says that she was going to [place] and actually needs a referral so that she can follow-up with them. 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 Says that she also sees Dr. Luna Glasgow regarding her knees. Says that he tells her that she needs surgery on her knee but she is not wanting surgery.  Asked about the Wellbutrin and Effexor. She says that this has been prescribed by her prior PCP. Says that both of these were started 14 years ago when her daughter passed away.  She does smoke but says that it's about one half pack per day. Says often she will take one or 2 puffs, lay it down, and then re-light it later.  No specific complaints or concerns today.   Past Medical History  Diagnosis Date  . Anxiety   . Asthma   . Cataract   . Ulcer   . Hiatal hernia   . Shortness of breath     chronic brochitis  . Bursitis   . Allergy   . COPD (chronic obstructive pulmonary disease) ([place])   . Depression   . Diabetes mellitus   . Hypercholesteremia   . Hypertension      Home Meds: Outpatient Prescriptions Prior to Visit  Medication Sig Dispense Refill  . albuterol (PROVENTIL HFA;VENTOLIN HFA) 108 (90 BASE) MCG/ACT inhaler Inhale 2 puffs into the lungs every 6 (six) hours as needed for wheezing or shortness of breath.    [place] Kitchen aspirin EC 81 MG tablet Take 81 mg by  mouth daily.    [place] atorvastatin (LIPITOR) 80 MG tablet Take 1 tablet by mouth Daily.     . BuPROPion HCl (WELLBUTRIN PO) Take 1 tablet by mouth at bedtime.    . cyclobenzaprine (FLEXERIL) 10 MG tablet Take 10 mg by mouth as needed for muscle spasms.    [place] Kitchen LISINOPRIL PO Take 1 tablet by mouth at bedtime.    . metFORMIN (GLUCOPHAGE) 500 MG tablet Take 500 mg by mouth 2 (two) times daily with a meal.      . montelukast (SINGULAIR) 10 MG tablet Take 10 mg by mouth at bedtime.    [place] Kitchen omeprazole (PRILOSEC) 20 MG capsule Take 20 mg by mouth 2 (two) times daily before a meal.    . traZODone (DESYREL) 50 MG tablet 150 mg at bedtime.     [place] Kitchen venlafaxine (EFFEXOR) 75 MG tablet Take 75 mg by mouth daily.      . predniSONE (DELTASONE) 20 MG tablet Take 2 tablets (40 mg total) by mouth daily. Start 12/18/2013 10 tablet 0   No facility-administered medications prior to visit.    Allergies:  Allergies  Allergen Reactions  . Codeine Itching  . Flonase [Fluticasone Propionate]     Severe coughing    Social  History   Social History  . Marital Status: Single    Spouse Name: N/A  . Number of Children: N/A  . Years of Education: N/A   Occupational History  . Not on file.   Social History Main Topics  . Smoking status: Current Every Day Smoker -- 0.50 packs/day for 52 years    Types: Cigarettes  . Smokeless tobacco: Never Used  . Alcohol Use: No  . Drug Use: 3.00 per week    Special: Marijuana     Comment: 1-3 times week  . Sexual Activity: Not Currently    Birth Control/ Protection: Surgical   Other Topics Concern  . Not on file   Social History Narrative    Family History  Problem Relation Age of Onset  . Breast cancer Daughter   . Anesthesia problems Neg Hx   . Hypotension Neg Hx   . Malignant hyperthermia Neg Hx   . Pseudochol deficiency Neg Hx   . Heart disease Mother   . Hyperlipidemia Mother   . Hyperlipidemia Father   . Cancer Sister   . Birth defects Grandchild       Review of Systems:  See HPI for pertinent ROS. All other ROS negative.    Physical Exam: Blood pressure 126/76, pulse 84, temperature 98.7 F (37.1 C), temperature source Oral, resp. rate 18, height 5' 4.5" (1.638 m), weight 199 lb (90.266 kg)., Body mass index is 33.64 kg/(m^2). General: Obese white female. Appears in no acute distress. Neck: Supple. No thyromegaly. No lymphadenopathy. No carotid bruits. Lungs: Clear bilaterally to auscultation without wheezes, rales, or rhonchi. Breathing is unlabored. Heart: RRR with S1 S2. No murmurs, rubs, or gallops. Abdomen: Soft, non-tender, non-distended with normoactive bowel sounds. No hepatomegaly. No rebound/guarding. No obvious abdominal masses. Musculoskeletal:  Strength and tone normal for age. Extremities/Skin: Warm and dry.  No edema.  Neuro: Alert and oriented X 3. Moves all extremities spontaneously. Gait is normal. CNII-XII grossly in tact. Psych:  Responds to questions appropriately with a normal affect.     ASSESSMENT AND PLAN:  69 y.o. year old female with   Smoker She is already on Wellbutrin. Cannot add further medications to help with cessation. She is smoking as little as possible on her own.  Pulmonary emphysema, unspecified emphysema type ([place]) This is stable. She has had no recent COPD exacerbations. Did review her medications and she is only on albuterol when necessary for this. Will discuss adding daily medication at next visit.  Depression This is stable/controlled on current medications.  Diabetes mellitus without complication ([place]) Will check labs to monitor. - COMPLETE METABOLIC PANEL WITH GFR - Hemoglobin A1c  Hypercholesteremia She is on Lipitor 80 mg. Check labs to monitor. - COMPLETE METABOLIC PANEL WITH GFR - Lipid panel  Essential hypertension Blood Pressure is at goal/controlled. Continue current medications. Check labs to monitor. - COMPLETE METABOLIC PANEL WITH GFR  PERSISTENT DISORDER  INITIATING/MAINTAINING SLEEP She states that she has to take trazodone at night or else she cannot sleep.  Obstructive sleep apnea She states that she does not use her CPAP as instructed. Says that she tried using it one night and got no sleep and does not use it.  Pain in joint, pelvic region and thigh, unspecified laterality This is managed by orthopedics. Enthesopathy of hip region, unspecified laterality This is managed by orthopedics.  Need for prophylactic vaccination against Streptococcus pneumoniae (pneumococcus) and influenza She reports that she knows that she got 1 pneumonia vaccine in the past  and then never got any further pneumonia vaccines.  She should've had the Pneumovax 23 in the past.  Updating with Prevnar 13 now. - Flu Vaccine QUAD 36+ mos PF IM (Fluarix & Fluzone Quad PF) - Pneumococcal conjugate vaccine 13-valent  Foot pain, bilateral - Ambulatory referral to [place] routine follow-up office visit 3 months. Soon[place] if needed.   [address] [place], [place], [facility] 01/12/2015 12:11 PM

## 2015-01-14 ENCOUNTER — Encounter: Payer: Self-pay | Admitting: Family Medicine

## 2015-01-31 ENCOUNTER — Ambulatory Visit (INDEPENDENT_AMBULATORY_CARE_PROVIDER_SITE_OTHER): Payer: Medicare HMO | Admitting: Podiatry

## 2015-01-31 ENCOUNTER — Encounter: Payer: Self-pay | Admitting: Podiatry

## 2015-01-31 ENCOUNTER — Ambulatory Visit (INDEPENDENT_AMBULATORY_CARE_PROVIDER_SITE_OTHER): Payer: Medicare HMO

## 2015-01-31 ENCOUNTER — Ambulatory Visit: Payer: Medicare HMO

## 2015-01-31 VITALS — BP 129/73 | HR 82 | Resp 16 | Ht 65.75 in | Wt 195.0 lb

## 2015-01-31 DIAGNOSIS — M779 Enthesopathy, unspecified: Secondary | ICD-10-CM

## 2015-01-31 DIAGNOSIS — M79672 Pain in left foot: Secondary | ICD-10-CM

## 2015-01-31 MED ORDER — TRIAMCINOLONE ACETONIDE 10 MG/ML IJ SUSP
10.0000 mg | Freq: Once | INTRAMUSCULAR | Status: AC
  Administered 2015-01-31: 10 mg

## 2015-01-31 MED ORDER — DICLOFENAC SODIUM 75 MG PO TBEC: 75.0000 mg | DELAYED_RELEASE_TABLET | Freq: Two times a day (BID) | ORAL | Status: DC

## 2015-01-31 NOTE — Progress Notes (Signed)
   Subjective:    Patient ID: [name], female    DOB: 15-Oct-1945, 70 y.o.   MRN: [id number]  HPI Patient presents with ankle pain in their left foot; lateral side; x4 months  Patient also presents with bilateral foot pain. Right foot-lateral side-x2 weeks. Left foot-back of heel radiating up leg-x4 months.   Review of Systems  All other systems reviewed and are negative.      Objective:   Physical Exam        Assessment & Plan:

## 2015-01-31 NOTE — Patient Instructions (Signed)

## 2015-02-02 NOTE — Progress Notes (Signed)
Subjective:     Patient ID: [name], female   DOB: [birth year], 70 y.o.   MRN: [id number]  HPI patient presents with a lot of pain in the outside of the left foot and also in the left ankle. States that both problems all present and they appear to be separate from each other and make it quite hard to walk comfortably   Review of Systems  All other systems reviewed and are negative.      Objective:   Physical Exam  Constitutional: She is oriented to person, place, and time.  Cardiovascular: Intact distal pulses.   Musculoskeletal: Normal range of motion.  Neurological: She is oriented to person, place, and time.  Skin: Skin is warm.  Nursing note and vitals reviewed.  neurovascular status found to be intact with muscle strength adequate range of motion within normal limits. Patient's noted to have quite a bit of discomfort in the peroneal group left with inflammation near the Ace of the fifth metatarsal and quite a bit of discomfort in the sinus tarsi left. Patient states that it's difficult to walk and that it's gotten worse over the last several months     Assessment:     Inflammatory tendinitis lateral side left foot along with sinus tarsitis left    Plan:     H&P and x-rays reviewed with patient. Today I injected the sinus tarsi left 3 mg Kenalog 5 mg Xylocaine and the peroneal tendon group left 3 mg Kenalog 5 mg Xylocaine. Patient tolerated both procedures well and was dispensed a brace to support the ankle reduced motion and will be seen back in 2 weeks

## 2015-02-14 ENCOUNTER — Ambulatory Visit: Payer: Medicare HMO | Admitting: Podiatry

## 2015-02-28 ENCOUNTER — Ambulatory Visit (INDEPENDENT_AMBULATORY_CARE_PROVIDER_SITE_OTHER): Payer: Medicare HMO | Admitting: Physician Assistant

## 2015-02-28 ENCOUNTER — Encounter: Payer: Self-pay | Admitting: Physician Assistant

## 2015-02-28 VITALS — BP 134/70 | HR 84 | Temp 98.5°F | Resp 18 | Wt 197.0 lb

## 2015-02-28 DIAGNOSIS — R197 Diarrhea, unspecified: Secondary | ICD-10-CM

## 2015-02-28 DIAGNOSIS — R1013 Epigastric pain: Secondary | ICD-10-CM | POA: Diagnosis not present

## 2015-02-28 DIAGNOSIS — E119 Type 2 diabetes mellitus without complications: Secondary | ICD-10-CM | POA: Diagnosis not present

## 2015-02-28 DIAGNOSIS — Z1159 Encounter for screening for other viral diseases: Secondary | ICD-10-CM | POA: Diagnosis not present

## 2015-02-28 DIAGNOSIS — R140 Abdominal distension (gaseous): Secondary | ICD-10-CM

## 2015-02-28 DIAGNOSIS — R1084 Generalized abdominal pain: Secondary | ICD-10-CM | POA: Diagnosis not present

## 2015-02-28 LAB — COMPLETE METABOLIC PANEL WITH GFR
ALT: 21 U/L (ref 6–29)
AST: 19 U/L (ref 10–35)
Albumin: 4.1 g/dL (ref 3.6–5.1)
Alkaline Phosphatase: 117 U/L (ref 33–130)
BILIRUBIN TOTAL: 0.2 mg/dL (ref 0.2–1.2)
BUN: 17 mg/dL (ref 7–25)
CO2: 24 mmol/L (ref 20–31)
CREATININE: 1.15 mg/dL — AB (ref 0.50–0.99)
Calcium: 9.1 mg/dL (ref 8.6–10.4)
Chloride: 104 mmol/L (ref 98–110)
GFR, EST AFRICAN AMERICAN: 57 mL/min — AB (ref >=60)
GFR, Est Non African American: 49 mL/min — ABNORMAL LOW (ref >=60)
Glucose, Bld: 102 mg/dL — ABNORMAL HIGH (ref 70–99)
Potassium: 5.4 mmol/L — ABNORMAL HIGH (ref 3.5–5.3)
SODIUM: 138 mmol/L (ref 135–146)
Total Protein: 7.3 g/dL (ref 6.1–8.1)

## 2015-02-28 LAB — CBC WITH DIFFERENTIAL/PLATELET
BASOS ABS: 0 10*3/uL (ref 0.0–0.1)
BASOS PCT: 0 % (ref 0–1)
EOS ABS: 0.2 10*3/uL (ref 0.0–0.7)
EOS PCT: 2 % (ref 0–5)
HCT: 34.4 % — ABNORMAL LOW (ref 36.0–46.0)
Hemoglobin: 11.2 g/dL — ABNORMAL LOW (ref 12.0–15.0)
Lymphocytes Relative: 25 % (ref 12–46)
Lymphs Abs: 2.6 10*3/uL (ref 0.7–4.0)
MCH: 28.3 pg (ref 26.0–34.0)
MCHC: 32.6 g/dL (ref 30.0–36.0)
MCV: 86.9 fL (ref 78.0–100.0)
MPV: 10.3 fL (ref 8.6–12.4)
Monocytes Absolute: 0.5 10*3/uL (ref 0.1–1.0)
Monocytes Relative: 5 % (ref 3–12)
NEUTROS PCT: 68 % (ref 43–77)
Neutro Abs: 7 10*3/uL (ref 1.7–7.7)
PLATELETS: 375 10*3/uL (ref 150–400)
RBC: 3.96 MIL/uL (ref 3.87–5.11)
RDW: 14.1 % (ref 11.5–15.5)
WBC: 10.3 10*3/uL (ref 4.0–10.5)

## 2015-02-28 LAB — LIPASE: LIPASE: 41 U/L (ref 7–60)

## 2015-02-28 LAB — AMYLASE: AMYLASE: 39 U/L (ref 0–105)

## 2015-02-28 LAB — TSH: TSH: 1.76 m[IU]/L

## 2015-02-28 MED ORDER — PIOGLITAZONE HCL 15 MG PO TABS: 15.0000 mg | ORAL_TABLET | Freq: Every day | ORAL | Status: DC

## 2015-02-28 NOTE — Progress Notes (Signed)
Patient ID: [name] MRN: [id number], DOB: August 10, 1945, 70 y.o. Date of Encounter: @DATE @  Chief Complaint:  Chief Complaint  Patient presents with  . Stomach issue 1-2 weeks    cramping, diarrhea, bloated, whole stomach hurts, bad heartburn    HPI: 70 y.o. year old white female  presents with above.   She reports that she had a colonoscopy 2 years ago that was normal performed at [facility].  She reports that for the past couple of years she has had diarrhea off and on.  She states that about 6 months ago she did have some episodes of urgency with this diarrhea. As that one night she had to clean the bed because she went in the bed before she could get to the bathroom. Says that a couple of times she went in her underwear.  Says that for the past 2 weeks it has been really bad and she has not been able to go anywhere because she is scared she is going to have "accident."  Also says that at times her upper abdomen bloat out. Says that she sometimes gets crampy pains but then also achy pains secondary to this bloating sensation.  Has been taking Pepto-Bismol. Used Imodium one time and says that after she took that she did not go to the bathroom for 24 hours.  I reviewed her medication list. She states that she has not been taking Vol[place] and recently. States that she is taking the Prilosec twice a day but it is not helping the symptoms. Says that  has been on the metformin for about 10 or 13 years.  She has had no bloody stools and has had no weight loss.  She has not been on antibiotics anytime recently and has had no foreign travel.  When I tell her that we are obtaining labs,she requests to have her hepatitis C screen included.    Past Medical History  Diagnosis Date  . Anxiety   . Asthma   . Cataract   . Ulcer   . Hiatal hernia   . Shortness of breath     chronic brochitis  . Bursitis   . Allergy   . COPD (chronic obstructive pulmonary disease)  ([place])   . Depression   . Diabetes mellitus   . Hypercholesteremia   . Hypertension      Home Meds: Outpatient Prescriptions Prior to Visit  Medication Sig Dispense Refill  . albuterol (PROVENTIL HFA;VENTOLIN HFA) 108 (90 BASE) MCG/ACT inhaler Inhale 2 puffs into the lungs every 6 (six) hours as needed for wheezing or shortness of breath.    Marland Kitchen aspirin EC 81 MG tablet Take 81 mg by mouth daily.    Marland Kitchen atorvastatin (LIPITOR) 80 MG tablet Take 1 tablet by mouth Daily.     . budesonide-formoterol (SYMBICORT) 160-4.5 MCG/ACT inhaler Inhale 2 puffs into the lungs.    . BuPROPion HCl (WELLBUTRIN PO) Take 1 tablet by mouth at bedtime.    . cyclobenzaprine (FLEXERIL) 10 MG tablet Take 10 mg by mouth as needed for muscle spasms.    . diclofenac (VOLTAREN) 75 MG EC tablet Take 1 tablet (75 mg total) by mouth 2 (two) times daily. 50 tablet 2  . LISINOPRIL PO Take 1 tablet by mouth at bedtime.    . montelukast (SINGULAIR) 10 MG tablet Take 10 mg by mouth at bedtime.    Marland Kitchen omeprazole (PRILOSEC) 20 MG capsule Take 20 mg by mouth 2 (two) times daily before a meal.    .  traZODone (DESYREL) 50 MG tablet 150 mg at bedtime.     Marland Kitchen venlafaxine (EFFEXOR) 75 MG tablet Take 75 mg by mouth daily.      . metFORMIN (GLUCOPHAGE) 500 MG tablet Take 500 mg by mouth 2 (two) times daily with a meal.       No facility-administered medications prior to visit.    Allergies:  Allergies  Allergen Reactions  . Codeine Itching  . Flonase [Fluticasone Propionate]     Severe coughing    Social History   Social History  . Marital Status: Single    Spouse Name: N/A  . Number of Children: N/A  . Years of Education: N/A   Occupational History  . Not on file.   Social History Main Topics  . Smoking status: Current Every Day Smoker -- 0.50 packs/day for 52 years    Types: Cigarettes  . Smokeless tobacco: Never Used  . Alcohol Use: No  . Drug Use: 3.00 per week    Special: Marijuana     Comment: 1-3 times week  .  Sexual Activity: Not Currently    Birth Control/ Protection: Surgical   Other Topics Concern  . Not on file   Social History Narrative    Family History  Problem Relation Age of Onset  . Breast cancer Daughter   . Anesthesia problems Neg Hx   . Hypotension Neg Hx   . Malignant hyperthermia Neg Hx   . Pseudochol deficiency Neg Hx   . Heart disease Mother   . Hyperlipidemia Mother   . Hyperlipidemia Father   . Cancer Sister   . Birth defects Grandchild      Review of Systems:  See HPI for pertinent ROS. All other ROS negative.    Physical Exam: Blood pressure 134/70, pulse 84, temperature 98.5 F (36.9 C), temperature source Oral, resp. rate 18, weight 197 lb (89.359 kg)., Body mass index is 32.04 kg/(m^2). General: WNWD WF. Appears in no acute distress. Neck: Supple. No thyromegaly. No lymphadenopathy. Lungs: Clear bilaterally to auscultation without wheezes, rales, or rhonchi. Breathing is unlabored. Heart: RRR with S1 S2. No murmurs, rubs, or gallops. Abdomen: Soft,  non-distended with normoactive bowel sounds. No hepatomegaly. No rebound/guarding. No obvious abdominal masses. She reports tenderness with palpation in the epigastric region. She also reports tenderness with palpation in the lower left abdomen and the midline lower abdomen. Musculoskeletal:  Strength and tone normal for age. Extremities/Skin: Warm and dry. No clubbing or cyanosis. No edema. No rashes or suspicious lesions. Neuro: Alert and oriented X 3. Moves all extremities spontaneously. Gait is normal. CNII-XII grossly in tact. Psych:  Responds to questions appropriately with a normal affect.     ASSESSMENT AND PLAN:  70 y.o. year old female with  1. Diarrhea, unspecified type  Last A1c 01/12/15 was 6.1. At that point was on metformin 500 mg twice a day which is what she is still on. Will have her stop the metformin. Change to Actos 15 mg daily. Will stop the metformin and see if symptoms improve getting  off of this medication. Continue proton pump inhibitor. She states that she currently has not been taking the Voltaren and I told her to use stay off of this. Will check labs and will follow-up with her when I get these results. If any lab abnormality then we will manage accordingly.  If Labs normal then she is to schedule follow-up visit with me within 1 week to follow-up whether her symptoms have improved at all  off of metformin. If labs normal and she has no improvement off of metformin,  then will decide whether to start probiotic and Viberzi and refer to GI versus other imaging. She is afebrile.  - CBC with Differential/Platelet - COMPLETE METABOLIC PANEL WITH GFR - TSH - Celiac panel 10 - H. pylori breath test  2. Abdominal bloating - CBC with Differential/Platelet - COMPLETE METABOLIC PANEL WITH GFR - TSH - Celiac panel 10 - H. pylori breath test  3. Generalized abdominal pain - CBC with Differential/Platelet - COMPLETE METABOLIC PANEL WITH GFR - TSH - Celiac panel 10 - H. pylori breath test  4. Diabetes mellitus without complication (HCC) - pioglitazone (ACTOS) 15 MG tablet; Take 1 tablet (15 mg total) by mouth daily.  Dispense: 30 tablet; Refill: 3 - CBC with Differential/Platelet - COMPLETE METABOLIC PANEL WITH GFR - TSH - Celiac panel 10 - H. pylori breath test  5. Abdominal pain, epigastric - Amylase - Lipase  6. Need for hepatitis C screening test - Hepatitis C Antibody   Signed, [address] [place], [place], [facility] 02/28/2015 10:01 AM

## 2015-03-01 LAB — CELIAC PANEL 10
Endomysial Screen: NEGATIVE
GLIADIN IGG: 1 U (ref <20)
Gliadin IgA: 3 Units (ref <20)
IGA: 144 mg/dL (ref 69–380)
Tissue Transglut Ab: 4 U/mL (ref <6)
Tissue Transglutaminase Ab, IgA: 1 U/mL (ref <4)

## 2015-03-01 LAB — H. PYLORI BREATH TEST: H. pylori Breath Test: NOT DETECTED

## 2015-03-01 LAB — HEPATITIS C ANTIBODY: HCV Ab: NEGATIVE

## 2015-03-02 ENCOUNTER — Encounter: Payer: Self-pay | Admitting: Podiatry

## 2015-03-02 ENCOUNTER — Ambulatory Visit (INDEPENDENT_AMBULATORY_CARE_PROVIDER_SITE_OTHER): Payer: Medicare HMO | Admitting: Podiatry

## 2015-03-02 VITALS — BP 115/69 | HR 85 | Resp 16

## 2015-03-02 DIAGNOSIS — M779 Enthesopathy, unspecified: Secondary | ICD-10-CM | POA: Diagnosis not present

## 2015-03-02 MED ORDER — TRIAMCINOLONE ACETONIDE 10 MG/ML IJ SUSP
10.0000 mg | Freq: Once | INTRAMUSCULAR | Status: AC
  Administered 2015-03-02: 10 mg

## 2015-03-02 NOTE — Progress Notes (Signed)
Subjective:     Patient ID: [name], female   DOB: July 26, 1945, 70 y.o.   MRN: [id number]  HPI patient states the left foot is starting to hurt again on the outside and she did achieve a short period of relief but it's reoccurring and making it hard to walk. She feels like it's the outside of the ankle but also the inside and top   Review of Systems     Objective:   Physical Exam Neurovascular status intact with discomfort in the outside of the left ankle with no indication of tendon dysfunction pain in the sinus tarsi and also the dorsal ankle area. Range of motion is free with no indications of arthritis    Assessment:     Appears to be inflammatory at this time and no indications that there is any current bone injury    Plan:     Due to the fact that it so tender I did go ahead today and I injected the lateral ankle gutter 3 Milligan Kenalog 5 mill grams Xylocaine and I went ahead and dispensed air fracture walker to completely immobilize and advised on heat and ice therapy. May require physical therapy or other treatments if symptoms persist

## 2015-03-24 ENCOUNTER — Telehealth: Payer: Self-pay | Admitting: *Deleted

## 2015-03-24 NOTE — Telephone Encounter (Signed)
Submitted [facility] referral thru [facility] for authorization to Dr Ila Mcgill DPM with authorization [phone]  Requesting provider: Flonnie Hailstone  Treating provider: Ila Mcgill, DPM  Number of visits:6  Start Date:03/30/15  End Date:09/26/15  Dx: M77.9- Enthesopathy,unspecified       M79.672- pain in left foot

## 2015-03-28 ENCOUNTER — Other Ambulatory Visit: Payer: Self-pay | Admitting: Family Medicine

## 2015-03-28 ENCOUNTER — Ambulatory Visit (INDEPENDENT_AMBULATORY_CARE_PROVIDER_SITE_OTHER): Payer: Commercial Managed Care - HMO | Admitting: Physician Assistant

## 2015-03-28 ENCOUNTER — Encounter: Payer: Self-pay | Admitting: Physician Assistant

## 2015-03-28 VITALS — BP 128/74 | HR 80 | Temp 98.1°F | Resp 18 | Wt 199.0 lb

## 2015-03-28 DIAGNOSIS — F410 Panic disorder [episodic paroxysmal anxiety] without agoraphobia: Secondary | ICD-10-CM | POA: Diagnosis not present

## 2015-03-28 DIAGNOSIS — F329 Major depressive disorder, single episode, unspecified: Secondary | ICD-10-CM | POA: Diagnosis not present

## 2015-03-28 DIAGNOSIS — Z720 Tobacco use: Secondary | ICD-10-CM | POA: Diagnosis not present

## 2015-03-28 DIAGNOSIS — R140 Abdominal distension (gaseous): Secondary | ICD-10-CM | POA: Diagnosis not present

## 2015-03-28 DIAGNOSIS — F17200 Nicotine dependence, unspecified, uncomplicated: Secondary | ICD-10-CM

## 2015-03-28 DIAGNOSIS — R1013 Epigastric pain: Secondary | ICD-10-CM

## 2015-03-28 DIAGNOSIS — I10 Essential (primary) hypertension: Secondary | ICD-10-CM | POA: Diagnosis not present

## 2015-03-28 DIAGNOSIS — E7800 Pure hypercholesterolemia, unspecified: Secondary | ICD-10-CM

## 2015-03-28 DIAGNOSIS — J439 Emphysema, unspecified: Secondary | ICD-10-CM

## 2015-03-28 DIAGNOSIS — E119 Type 2 diabetes mellitus without complications: Secondary | ICD-10-CM

## 2015-03-28 DIAGNOSIS — F32A Depression, unspecified: Secondary | ICD-10-CM

## 2015-03-28 DIAGNOSIS — R1310 Dysphagia, unspecified: Secondary | ICD-10-CM

## 2015-03-28 DIAGNOSIS — R197 Diarrhea, unspecified: Secondary | ICD-10-CM

## 2015-03-28 LAB — LIPID PANEL
CHOL/HDL RATIO: 2.8 ratio (ref <=5.0)
Cholesterol: 144 mg/dL (ref 125–200)
HDL: 51 mg/dL (ref >=46)
LDL Cholesterol: 63 mg/dL (ref <130)
Triglycerides: 151 mg/dL — ABNORMAL HIGH (ref <150)
VLDL: 30 mg/dL (ref <30)

## 2015-03-28 LAB — HEPATIC FUNCTION PANEL
ALT: 19 U/L (ref 6–29)
AST: 18 U/L (ref 10–35)
Albumin: 4.1 g/dL (ref 3.6–5.1)
Alkaline Phosphatase: 111 U/L (ref 33–130)
BILIRUBIN TOTAL: 0.2 mg/dL (ref 0.2–1.2)
Total Protein: 7.1 g/dL (ref 6.1–8.1)

## 2015-03-28 LAB — HEMOGLOBIN A1C
HEMOGLOBIN A1C: 6.2 % — AB (ref <5.7)
MEAN PLASMA GLUCOSE: 131 mg/dL — AB (ref <117)

## 2015-03-28 MED ORDER — ALPRAZOLAM 0.25 MG PO TABS: 0.2500 mg | ORAL_TABLET | Freq: Two times a day (BID) | ORAL | Status: DC | PRN

## 2015-03-28 MED ORDER — GLUCOSE BLOOD VI STRP: 1.0000 | ORAL_STRIP | Freq: Two times a day (BID) | Status: DC

## 2015-03-28 NOTE — Progress Notes (Signed)
Patient ID: [name] MRN: [id number], DOB: Jul 02, 1945, 70 y.o. Date of Encounter: @DATE @  Chief Complaint:  Chief Complaint  Patient presents with  . follow up    HPI: 70 y.o. year old white female  presents for f/u OV.   She had office visit with me 02/28/15 at which time she reported that over the past couple of years she been having diarrhea off and on. Also having significant urgency and even occasional diarrhea and her underwear. Also having crampy abdominal pains and, bloating. At that visit I obtained extensive lab including H pylori, CBC, CMET, TSH, celiac panel, amylase, lipase. Labs were normal. At that time I had her stop her metformin-- the dosage was just 500 mg twice a day. At time had her replace this with Actos. Today she states that she feels so much better. Says that she feels great. Says that she is having no further diarrhea since being off of the metformin. No further abdominal cramping and no further bloating.  Says that she occasionally feels a little bit of pain in the epigastric region and occasionally feels like she has a little difficulty swallowing. However she says that this is very random and does not happen on a routine basis. Says that she can swallow meat without feeling like it is getting stuck. We discussed going ahead and having her follow-up with GI for possible endoscopy that she states that she will monitor this and call me if it worsens. At this time she is already on PPI and is avoiding NSAIDs.  Today I reviewed chart and realized that she was seen by me as a new patient 01/12/15. Reviewed that at that time her lipid panel showed her cholesterol to be high and she then informed us that she had been off of for her statin. Today she states that she has been back on this- since that phone call. Will go ahead and recheck those labs for follow-up.  Also today she says that she has had a couple of episodes of feeling panic attack over the last 6  months.  States that both of these episodes occurred when she was in the presence of her son. Says that she does not usually see him. I did not start asking questions were go into any further detail of their history but she clearly states that the 2 times that she has felt panic have been prompted by family members. Reviewed my note from 01/12/15. Reviewed that she is already on Effexor and Wellbutrin. At that visit reported that she had been on meds since death of her daughter. See note 01/12/15 for further info.   No other complaints or concerns.    Past Medical History  Diagnosis Date  . Anxiety   . Asthma   . Cataract   . Ulcer   . Hiatal hernia   . Shortness of breath     chronic brochitis  . Bursitis   . Allergy   . COPD (chronic obstructive pulmonary disease) ([place])   . Depression   . Diabetes mellitus   . Hypercholesteremia   . Hypertension      Home Meds: Outpatient Prescriptions Prior to Visit  Medication Sig Dispense Refill  . albuterol (PROVENTIL HFA;VENTOLIN HFA) 108 (90 BASE) MCG/ACT inhaler Inhale 2 puffs into the lungs every 6 (six) hours as needed for wheezing or shortness of breath.    Marland Kitchen aspirin EC 81 MG tablet Take 81 mg by mouth daily.    Marland Kitchen atorvastatin (  LIPITOR) 80 MG tablet Take 1 tablet by mouth Daily.     . budesonide-formoterol (SYMBICORT) 160-4.5 MCG/ACT inhaler Inhale 2 puffs into the lungs.    . BuPROPion HCl (WELLBUTRIN PO) Take 1 tablet by mouth at bedtime.    . cyclobenzaprine (FLEXERIL) 10 MG tablet Take 10 mg by mouth as needed for muscle spasms.    . diclofenac (VOLTAREN) 75 MG EC tablet Take 1 tablet (75 mg total) by mouth 2 (two) times daily. 50 tablet 2  . montelukast (SINGULAIR) 10 MG tablet Take 10 mg by mouth at bedtime.    Marland Kitchen omeprazole (PRILOSEC) 20 MG capsule Take 20 mg by mouth 2 (two) times daily before a meal.    . pioglitazone (ACTOS) 15 MG tablet Take 1 tablet (15 mg total) by mouth daily. 30 tablet 3  . traZODone (DESYREL) 50 MG  tablet 150 mg at bedtime.     Marland Kitchen venlafaxine (EFFEXOR) 75 MG tablet Take 75 mg by mouth daily.      Marland Kitchen LISINOPRIL PO Take 1 tablet by mouth at bedtime.     No facility-administered medications prior to visit.    Allergies:  Allergies  Allergen Reactions  . Codeine Itching  . Flonase [Fluticasone Propionate]     Severe coughing  . Metformin And Related Diarrhea    Caused severe diarrhea and severe GI symptoms---even on low dose 500mg  BID    Social History   Social History  . Marital Status: Single    Spouse Name: N/A  . Number of Children: N/A  . Years of Education: N/A   Occupational History  . Not on file.   Social History Main Topics  . Smoking status: Current Every Day Smoker -- 0.50 packs/day for 52 years    Types: Cigarettes  . Smokeless tobacco: Never Used  . Alcohol Use: No  . Drug Use: 3.00 per week    Special: Marijuana     Comment: 1-3 times week  . Sexual Activity: Not Currently    Birth Control/ Protection: Surgical   Other Topics Concern  . Not on file   Social History Narrative    Family History  Problem Relation Age of Onset  . Breast cancer Daughter   . Anesthesia problems Neg Hx   . Hypotension Neg Hx   . Malignant hyperthermia Neg Hx   . Pseudochol deficiency Neg Hx   . Heart disease Mother   . Hyperlipidemia Mother   . Hyperlipidemia Father   . Cancer Sister   . Birth defects Grandchild      Review of Systems:  See HPI for pertinent ROS. All other ROS negative.    Physical Exam: Blood pressure 128/74, pulse 80, temperature 98.1 F (36.7 C), temperature source Oral, resp. rate 18, weight 199 lb (90.266 kg)., Body mass index is 32.37 kg/(m^2). General: WNWD WF. Appears in no acute distress. Neck: Supple. No thyromegaly. No lymphadenopathy. Lungs: Clear bilaterally to auscultation without wheezes, rales, or rhonchi. Breathing is unlabored. Heart: RRR with S1 S2. No murmurs, rubs, or gallops. Abdomen: Soft, non-tender, non-distended  with normoactive bowel sounds. No hepatomegaly. No rebound/guarding. No obvious abdominal masses. Musculoskeletal:  Strength and tone normal for age. Extremities/Skin: Warm and dry. No clubbing or cyanosis. No edema. No rashes or suspicious lesions. Neuro: Alert and oriented X 3. Moves all extremities spontaneously. Gait is normal. CNII-XII grossly in tact. Psych:  Responds to questions appropriately with a normal affect.     ASSESSMENT AND PLAN:  70 y.o. year old  female with  1. Diarrhea, unspecified type Resolved.  No Metformin---added to "Allergy list"  2. Panic disorder Discussed with her to take this medication only when she is feeling severe panic. Explained not to take this medication prior to driving. - ALPRAZolam (XANAX) 0.25 MG tablet; Take 1 tablet (0.25 mg total) by mouth 2 (two) times daily as needed for anxiety.  Dispense: 20 tablet; Refill: 0  3. Abdominal bloating Resolved. Stay off Metformin.  4. Abdominal pain, epigastric Continue  PPI. Continue to avoid NSAIDs.   5. Dysphagia She reports that this only occurs occasionally and does not occur with any pattern. If this worsens or does not resolve she will call and I will refer her to GI.  6. Depression Stable/controlled with current medication.  7. Diabetes mellitus without complication ([place]) She had diabetic office visit with me 12/2014. See that note for details of this. Will check A1c while she is here today. - Hemoglobin A1c  8. Hypercholesteremia At labs 12/2014 LDL was high and she then informed us that she had been off of her statin. At that time, she resumed statin. Recheck lab now. - Hepatic function panel - Lipid panel  9. Essential hypertension Blood pressure control/at goal. Continue current medications. She just had renal function checked in lab 02/28/15 and that was stable.  10. Smoker Needs cessation.  11. Pulmonary emphysema, unspecified emphysema type ([place]) Needs smoking cessation. She is  on Symbicort and albuterol.  Schedule routine follow-up visit 3 months. Follow-up sooner if needed.   Signed, [address] [place], [place], [facility] 03/28/2015 11:03 AM

## 2015-03-30 ENCOUNTER — Ambulatory Visit: Payer: Medicare HMO | Admitting: Podiatry

## 2015-04-08 ENCOUNTER — Encounter: Payer: Self-pay | Admitting: Internal Medicine

## 2015-04-13 ENCOUNTER — Ambulatory Visit: Payer: Medicare HMO | Admitting: Physician Assistant

## 2015-04-25 ENCOUNTER — Telehealth: Payer: Self-pay | Admitting: Orthopaedic Surgery

## 2015-04-25 NOTE — Telephone Encounter (Signed)
Rx done. 

## 2015-05-30 ENCOUNTER — Other Ambulatory Visit: Payer: Self-pay | Admitting: Physician Assistant

## 2015-05-30 NOTE — Telephone Encounter (Signed)
Medication refilled per protocol. 

## 2015-06-10 ENCOUNTER — Ambulatory Visit (INDEPENDENT_AMBULATORY_CARE_PROVIDER_SITE_OTHER): Payer: Commercial Managed Care - HMO | Admitting: Family Medicine

## 2015-06-10 VITALS — BP 124/76 | HR 96 | Temp 98.4°F | Resp 18 | Wt 198.0 lb

## 2015-06-10 DIAGNOSIS — F410 Panic disorder [episodic paroxysmal anxiety] without agoraphobia: Secondary | ICD-10-CM | POA: Diagnosis not present

## 2015-06-10 DIAGNOSIS — F32A Depression, unspecified: Secondary | ICD-10-CM

## 2015-06-10 DIAGNOSIS — Z1231 Encounter for screening mammogram for malignant neoplasm of breast: Secondary | ICD-10-CM | POA: Diagnosis not present

## 2015-06-10 DIAGNOSIS — F329 Major depressive disorder, single episode, unspecified: Secondary | ICD-10-CM

## 2015-06-10 DIAGNOSIS — E2839 Other primary ovarian failure: Secondary | ICD-10-CM

## 2015-06-10 DIAGNOSIS — Z1239 Encounter for other screening for malignant neoplasm of breast: Secondary | ICD-10-CM

## 2015-06-10 MED ORDER — LISINOPRIL 40 MG PO TABS
40.0000 mg | ORAL_TABLET | Freq: Every day | ORAL | Status: DC
Start: 2015-06-10 — End: 2015-09-19

## 2015-06-10 MED ORDER — TRAZODONE HCL 50 MG PO TABS: 150.0000 mg | ORAL_TABLET | Freq: Every evening | ORAL | Status: DC | PRN

## 2015-06-10 NOTE — Progress Notes (Signed)
Subjective:    Patient ID: [name], female    DOB: Jun 02, 1945, 70 y.o.   MRN: [id number]  HPI Patient has a history of depression, chronic insomnia, occasional anxiety attacks. She is here today simply to get a refill on her medication. She normally sees [facility].  She is currently on Effexor 75 mg by mouth twice a day in addition to Wellbutrin 300 mg a day. She takes trazodone 150 mg by mouth daily at bedtime to help her sleep. Without the medication she is unable to sleep. She is upset because she is out of medication and needs it to sleep. Her most recent lab work was checked in February and March. At that time her cholesterol tests were excellent. Her hemoglobin A1c was below 6.5. Her blood pressure today is well controlled. Her last colonoscopy was 3 years ago and is up-to-date. Past medical history is significant for hysterectomy and therefore she does not require a Pap smear. She is however due for a mammogram as well as a bone density test. Past Medical History  Diagnosis Date  . Anxiety   . Asthma   . Cataract   . Ulcer   . Hiatal hernia   . Shortness of breath     chronic brochitis  . Bursitis   . Allergy   . COPD (chronic obstructive pulmonary disease) ([place])   . Depression   . Diabetes mellitus   . Hypercholesteremia   . Hypertension    Past Surgical History  Procedure Laterality Date  . Neuroplasty / transposition median nerve at carpal tunnel bilateral    . Plantar fasc bilateral    . Rt foot reconstruction    . Right rotator cuff    . Left elbow tendon repair    . Fracture lower back    . Trigger thumb      Left  . Abdominal hysterectomy    . Cataract extraction w/phaco  10/16/2010    Procedure: CATARACT EXTRACTION PHACO AND INTRAOCULAR LENS PLACEMENT (IOC);  Surgeon: Tonny Branch;  Location: [facility];  Service: Ophthalmology;  Laterality: Left;  CDE: 6.99  . Cataract extraction w/phaco  12/18/2010    Procedure: CATARACT EXTRACTION PHACO AND INTRAOCULAR  LENS PLACEMENT (IOC);  Surgeon: Tonny Branch;  Location: [facility];  Service: Ophthalmology;  Laterality: Right;  CDE 12.50  . Eye surgery     Current Outpatient Prescriptions on File Prior to Visit  Medication Sig Dispense Refill  . albuterol (PROVENTIL HFA;VENTOLIN HFA) 108 (90 BASE) MCG/ACT inhaler Inhale 2 puffs into the lungs every 6 (six) hours as needed for wheezing or shortness of breath.    . ALPRAZolam (XANAX) 0.25 MG tablet Take 1 tablet (0.25 mg total) by mouth 2 (two) times daily as needed for anxiety. 20 tablet 0  . aspirin EC 81 MG tablet Take 81 mg by mouth daily.    Marland Kitchen atorvastatin (LIPITOR) 80 MG tablet Take 1 tablet by mouth Daily.     . budesonide-formoterol (SYMBICORT) 160-4.5 MCG/ACT inhaler Inhale 2 puffs into the lungs.    . BuPROPion HCl (WELLBUTRIN PO) Take 1 tablet by mouth at bedtime.    . cyclobenzaprine (FLEXERIL) 10 MG tablet TAKE 1 TABLET EVERY 8 HOURS AS NEEDED FOR SPASM. 90 tablet 0  . glucose blood test strip 1 each by Other route 2 (two) times daily. Use as instructed 200 each 3  . montelukast (SINGULAIR) 10 MG tablet Take 10 mg by mouth at bedtime.    Marland Kitchen omeprazole (  PRILOSEC) 20 MG capsule Take 20 mg by mouth 2 (two) times daily before a meal.    . pioglitazone (ACTOS) 15 MG tablet TAKE ONE TABLET BY MOUTH ONCE DAILY. 90 tablet 0  . venlafaxine (EFFEXOR) 75 MG tablet Take 75 mg by mouth daily.      . diclofenac (VOLTAREN) 75 MG EC tablet Take 1 tablet (75 mg total) by mouth 2 (two) times daily. (Patient not taking: Reported on 06/10/2015) 50 tablet 2   No current facility-administered medications on file prior to visit.   Allergies  Allergen Reactions  . Codeine Itching  . Flonase [Fluticasone Propionate]     Severe coughing  . Metformin And Related Diarrhea    Caused severe diarrhea and severe GI symptoms---even on low dose 500mg  BID   Social History   Social History  . Marital Status: Single    Spouse Name: N/A  . Number of Children: N/A  . Years of  Education: N/A   Occupational History  . Not on file.   Social History Main Topics  . Smoking status: Current Every Day Smoker -- 0.50 packs/day for 52 years    Types: Cigarettes  . Smokeless tobacco: Never Used  . Alcohol Use: No  . Drug Use: 3.00 per week    Special: Marijuana     Comment: 1-3 times week  . Sexual Activity: Not Currently    Birth Control/ Protection: Surgical   Other Topics Concern  . Not on file   Social History Narrative      Review of Systems  All other systems reviewed and are negative.      Objective:   Physical Exam  Cardiovascular: Normal rate, regular rhythm and normal heart sounds.   No murmur heard. Pulmonary/Chest: Effort normal and breath sounds normal. No respiratory distress. She has no wheezes. She has no rales.  Abdominal: Soft. Bowel sounds are normal. She exhibits no distension. There is no tenderness. There is no rebound.  Psychiatric: She has a normal mood and affect. Her behavior is normal. Judgment and thought content normal.  Vitals reviewed.         Assessment & Plan:  Panic disorder  Depression  Breast cancer screening, high risk patient - Plan: MM Digital Screening  Estrogen deficiency - Plan: DG Bone Density  Patient will be due for repeat lab work in September. Immunizations are up-to-date. We did discuss the shingles vaccine. I will schedule her for mammogram as well as a bone density test to complete her preventative care. I refilled her trazodone and her lisinopril as she was out of both medications. Follow up as planned normal and with PCP/Mary Beth Dixon.

## 2015-06-13 ENCOUNTER — Other Ambulatory Visit: Payer: Self-pay | Admitting: Family Medicine

## 2015-06-13 DIAGNOSIS — Z1239 Encounter for other screening for malignant neoplasm of breast: Secondary | ICD-10-CM

## 2015-06-24 ENCOUNTER — Other Ambulatory Visit: Payer: Self-pay | Admitting: Family Medicine

## 2015-06-24 NOTE — Telephone Encounter (Signed)
Refill appropriate and filled per protocol. 

## 2015-06-28 ENCOUNTER — Other Ambulatory Visit: Payer: Medicare HMO

## 2015-06-28 ENCOUNTER — Ambulatory Visit: Payer: Medicare HMO

## 2015-07-04 ENCOUNTER — Ambulatory Visit: Payer: Medicare HMO

## 2015-07-04 ENCOUNTER — Other Ambulatory Visit: Payer: Medicare HMO

## 2015-07-04 ENCOUNTER — Ambulatory Visit: Payer: Commercial Managed Care - HMO | Admitting: Physician Assistant

## 2015-07-05 ENCOUNTER — Telehealth: Payer: Self-pay

## 2015-07-05 ENCOUNTER — Other Ambulatory Visit: Payer: Self-pay

## 2015-07-05 DIAGNOSIS — M779 Enthesopathy, unspecified: Secondary | ICD-10-CM

## 2015-07-05 DIAGNOSIS — M79672 Pain in left foot: Secondary | ICD-10-CM

## 2015-07-05 NOTE — Telephone Encounter (Signed)
Pt called stating that she was still having a lot of pain in her foot, states that she has had a series of injections and that at her last visit Dr Paulla Dolly advised if pain persisted then an MRI could be ordered. Patient wants to know if this can be ordered without having to come in and see him again due to financial reasons.

## 2015-07-05 NOTE — Telephone Encounter (Signed)
We can proceed and order MRI left foot for lateral foot and ankle pain, eval tendons r/o tear Let the patient know that after MRI she will have to come in so Dr. Paulla Dolly can discuss results and formulate a plan of care -Dr. Cannon Kettle

## 2015-07-05 NOTE — Addendum Note (Signed)
Addended by: Roney Jaffe on: 07/05/2015 02:17 PM   Modules accepted: Orders

## 2015-07-05 NOTE — Telephone Encounter (Signed)
[employer] PRIOR AUTHORIZED [phone], EXPIRES 01/22/2016. FAXED TO [facility].

## 2015-07-08 ENCOUNTER — Ambulatory Visit
Admission: RE | Admit: 2015-07-08 | Discharge: 2015-07-08 | Disposition: A | Discharge status: Home / Self Care | Payer: Commercial Managed Care - HMO | Source: Ambulatory Visit | Attending: Podiatry | Admitting: Podiatry

## 2015-07-08 DIAGNOSIS — R600 Localized edema: Secondary | ICD-10-CM | POA: Diagnosis not present

## 2015-07-08 DIAGNOSIS — M779 Enthesopathy, unspecified: Secondary | ICD-10-CM

## 2015-07-14 ENCOUNTER — Telehealth: Payer: Self-pay | Admitting: *Deleted

## 2015-07-14 NOTE — Telephone Encounter (Signed)
Patient has lesion in her ankle that may require surgery. She should come in on a Friday or Monday and I will see her with Dr. Jacqualyn Posey

## 2015-07-14 NOTE — Telephone Encounter (Addendum)
Pt called for MRI results. Informed pt of Dr. Mellody Drown orders and pt states she has an appt on 07/27/2015, told pt we needed her in on a Monday or Friday. I transferred pt to schedulers to schedule an appt for 08/01/2015. 08/08/2015-Pt states she is calling to see what Dr. Paulla Dolly is planning to do with her foot after the MRI. Left message informing pt that Dr. Paulla Dolly wanted her to make an appt on a Monday or Wednesday when he and Dr. Jacqualyn Posey were in office and that Dr. Paulla Dolly refilled her [place]. 09/01/2015-Pt states she would like a refill of the pain medication, has surgery 09/14/2015. Dr. Josephina Shih refill as previously. Pt states she will have someone pick up the rx in the [place] office. 09/21/2015-Pt states she was suppose to have surgery, but doesn't know when she'll be able to schedule and needs more pain medication.  Dr. Paulla Dolly states refill as previously. 12/02/2015-Pt request refill of her pain medication. Informed pt Dr. Paulla Dolly ordered to refill Percocet and she could pick up in the [place] office.

## 2015-07-18 ENCOUNTER — Ambulatory Visit (INDEPENDENT_AMBULATORY_CARE_PROVIDER_SITE_OTHER): Payer: Commercial Managed Care - HMO | Admitting: Podiatry

## 2015-07-18 ENCOUNTER — Ambulatory Visit (INDEPENDENT_AMBULATORY_CARE_PROVIDER_SITE_OTHER): Payer: Commercial Managed Care - HMO

## 2015-07-18 ENCOUNTER — Encounter: Payer: Self-pay | Admitting: Podiatry

## 2015-07-18 DIAGNOSIS — M779 Enthesopathy, unspecified: Secondary | ICD-10-CM

## 2015-07-18 MED ORDER — TRIAMCINOLONE ACETONIDE 10 MG/ML IJ SUSP
10.0000 mg | Freq: Once | INTRAMUSCULAR | Status: AC
  Administered 2015-07-18: 10 mg

## 2015-07-18 MED ORDER — HYDROCODONE-ACETAMINOPHEN 10-325 MG PO TABS: 1.0000 | ORAL_TABLET | Freq: Three times a day (TID) | ORAL | Status: DC | PRN

## 2015-07-20 NOTE — Progress Notes (Signed)
Subjective:     Patient ID: [name], female   DOB: [birth year], 70 y.o.   MRN: [id number]  HPI patient presents with continued pain in the left ankle joints stating that it still very tender when pressed and that the injection and the immobilization has only helped slightly   Review of Systems     Objective:   Physical Exam Neurovascular status unchanged with limitation of motion of the left ankle as she is splinting due to pain. 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 At this point I did reinject one more time to try to give her some short-term relief and I did place her on Vicodin for nighttime usage. I will discuss this case with other physicians and we will make a decision on what might be appropriate to try to help

## 2015-07-27 ENCOUNTER — Ambulatory Visit: Payer: Commercial Managed Care - HMO | Admitting: Podiatry

## 2015-07-28 ENCOUNTER — Telehealth: Payer: Self-pay

## 2015-07-28 NOTE — Telephone Encounter (Signed)
Norco 10 -325 mg

## 2015-07-28 NOTE — Telephone Encounter (Signed)
Pt called stating the pain medication given to her was not helping alleviate her pain, the pain in her foot persists and she wants to know if there is anything else we can do

## 2015-07-28 NOTE — Telephone Encounter (Signed)
I don't know what she is taking

## 2015-07-29 ENCOUNTER — Telehealth: Payer: Self-pay | Admitting: Family Medicine

## 2015-07-29 MED ORDER — ATORVASTATIN CALCIUM 80 MG PO TABS: 80.0000 mg | ORAL_TABLET | Freq: Every day | ORAL | Status: DC

## 2015-07-29 NOTE — Telephone Encounter (Signed)
Nothing else would be effective

## 2015-07-29 NOTE — Telephone Encounter (Signed)
Pt is requesting a refill of Lipitor 80 mg sent to [facility]

## 2015-07-29 NOTE — Telephone Encounter (Signed)
Medication called/sent to requested pharmacy  

## 2015-08-01 ENCOUNTER — Ambulatory Visit: Payer: Commercial Managed Care - HMO | Admitting: Podiatry

## 2015-08-01 ENCOUNTER — Ambulatory Visit: Payer: Self-pay

## 2015-08-01 ENCOUNTER — Inpatient Hospital Stay: Admission: RE | Admit: 2015-08-01 | Payer: Commercial Managed Care - HMO | Source: Ambulatory Visit

## 2015-08-08 MED ORDER — HYDROCODONE-ACETAMINOPHEN 10-325 MG PO TABS: 1.0000 | ORAL_TABLET | Freq: Three times a day (TID) | ORAL | Status: DC | PRN

## 2015-08-12 ENCOUNTER — Ambulatory Visit (INDEPENDENT_AMBULATORY_CARE_PROVIDER_SITE_OTHER): Payer: Commercial Managed Care - HMO | Admitting: Podiatry

## 2015-08-12 DIAGNOSIS — M79672 Pain in left foot: Secondary | ICD-10-CM

## 2015-08-12 DIAGNOSIS — M779 Enthesopathy, unspecified: Secondary | ICD-10-CM | POA: Diagnosis not present

## 2015-08-12 NOTE — Patient Instructions (Signed)
Pre-Operative Instructions  Congratulations, you have decided to take an important step to improving your quality of life.  You can be assured that the doctors of [facility] will be with you every step of the way.  1. Plan to be at the surgery center/hospital at least 1 (one) hour prior to your scheduled time unless otherwise directed by the surgical center/hospital staff.  You must have a responsible adult accompany you, remain during the surgery and drive you home.  Make sure you have directions to the surgical center/hospital and know how to get there on time. 2. For hospital based surgery you will need to obtain a history and physical form from your family physician within 1 month prior to the date of surgery- we will give you a form for you primary physician.  3. We make every effort to accommodate the date you request for surgery.  There are however, times where surgery dates or times have to be moved.  We will contact you as soon as possible if a change in schedule is required.   4. No Aspirin/Ibuprofen for one week before surgery.  If you are on aspirin, any non-steroidal anti-inflammatory medications (Mobic, Aleve, Ibuprofen) you should stop taking it 7 days prior to your surgery.  You make take Tylenol  For pain prior to surgery.  5. Medications- If you are taking daily heart and blood pressure medications, seizure, reflux, allergy, asthma, anxiety, pain or diabetes medications, make sure the surgery center/hospital is aware before the day of surgery so they may notify you which medications to take or avoid the day of surgery. 6. No food or drink after midnight the night before surgery unless directed otherwise by surgical center/hospital staff. 7. No alcoholic beverages 24 hours prior to surgery.  No smoking 24 hours prior to or 24 hours after surgery. 8. Wear loose pants or shorts- loose enough to fit over bandages, boots, and casts. 9. No slip on shoes, sneakers are best. 10. Bring  your boot with you to the surgery center/hospital.  Also bring crutches or a walker if your physician has prescribed it for you.  If you do not have this equipment, it will be provided for you after surgery. 11. If you have not been contracted by the surgery center/hospital by the day before your surgery, call to confirm the date and time of your surgery. 12. Leave-time from work may vary depending on the type of surgery you have.  Appropriate arrangements should be made prior to surgery with your employer. 13. Prescriptions will be provided immediately following surgery by your doctor.  Have these filled as soon as possible after surgery and take the medication as directed. 14. Remove nail polish on the operative foot. 15. Wash the night before surgery.  The night before surgery wash the foot and leg well with the antibacterial soap provided and water paying special attention to beneath the toenails and in between the toes.  Rinse thoroughly with water and dry well with a towel.  Perform this wash unless told not to do so by your physician.  Enclosed: 1 Ice pack (please put in freezer the night before surgery)   1 Hibiclens skin cleaner   Pre-op Instructions  If you have any questions regarding the instructions, do not hesitate to call our office.  [place]: [address] [place], [place] [address] [phone]  [place]: [address] [place], [place] [address] [phone]  [place]: [address]  [place],  [address] [phone]   Dr.   Ady Heimann DPM, Dr. Matthew Wagoner DPM, Dr. M. Todd Hyatt DPM, Dr. Titorya Stover DPM 

## 2015-08-15 NOTE — Progress Notes (Signed)
Subjective:     Patient ID: [name], female   DOB: 12/04/1945, 70 y.o.   MRN: [id number]  HPI patient presents with chronic pain in the left ankle that has been going on for a long time and has failed to respond to numerous conservative treatments   Review of Systems     Objective:   Physical Exam Neurovascular status found to be intact muscle strength adequate range of motion was found to be reduced in the subtalar and ankle joint left with quite a bit of discomfort in the lateral ankle and moderate discomfort around this area which is most likely related to compensatory gait pattern secondary to probable injury to the ankle joint    Assessment:     What appears to be 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 Jacqualyn Posey and I reviewed this case at great length discussing the MRI report and the condition that the patient has. We are both in agreement that trying to work on the osteochondral lesion create stimulating blood and also probable grafting is the best chance she hasn't relief but that long-term there is a very good chance she'll need fusion procedure and that this may not cure all the problems that she has. Patient understands this after extensive discussion review and at this time she was allowed to read consent form reviewing alternative treatments complications. She understands she'll have a prolonged period of immobilization and that total recovery will be at least one year with no long-term guarantees of this procedure. Patient wants surgery understanding all this and signs consent form

## 2015-08-26 ENCOUNTER — Ambulatory Visit: Payer: Commercial Managed Care - HMO | Admitting: Podiatry

## 2015-09-01 MED ORDER — HYDROCODONE-ACETAMINOPHEN 10-325 MG PO TABS: ORAL_TABLET | ORAL | 0 refills | Status: DC

## 2015-09-05 ENCOUNTER — Encounter: Payer: Self-pay | Admitting: Physician Assistant

## 2015-09-05 ENCOUNTER — Ambulatory Visit (INDEPENDENT_AMBULATORY_CARE_PROVIDER_SITE_OTHER): Payer: Commercial Managed Care - HMO | Admitting: Physician Assistant

## 2015-09-05 VITALS — BP 138/80 | HR 78 | Temp 98.7°F | Resp 16 | Ht 65.75 in | Wt 206.0 lb

## 2015-09-05 DIAGNOSIS — E119 Type 2 diabetes mellitus without complications: Secondary | ICD-10-CM

## 2015-09-05 DIAGNOSIS — F17200 Nicotine dependence, unspecified, uncomplicated: Secondary | ICD-10-CM

## 2015-09-05 DIAGNOSIS — G4700 Insomnia, unspecified: Secondary | ICD-10-CM | POA: Diagnosis not present

## 2015-09-05 DIAGNOSIS — M79671 Pain in right foot: Secondary | ICD-10-CM | POA: Diagnosis not present

## 2015-09-05 DIAGNOSIS — J439 Emphysema, unspecified: Secondary | ICD-10-CM | POA: Diagnosis not present

## 2015-09-05 DIAGNOSIS — M79672 Pain in left foot: Secondary | ICD-10-CM | POA: Diagnosis not present

## 2015-09-05 DIAGNOSIS — E7800 Pure hypercholesterolemia, unspecified: Secondary | ICD-10-CM | POA: Diagnosis not present

## 2015-09-05 DIAGNOSIS — G4733 Obstructive sleep apnea (adult) (pediatric): Secondary | ICD-10-CM | POA: Diagnosis not present

## 2015-09-05 DIAGNOSIS — Z720 Tobacco use: Secondary | ICD-10-CM | POA: Diagnosis not present

## 2015-09-05 DIAGNOSIS — F410 Panic disorder [episodic paroxysmal anxiety] without agoraphobia: Secondary | ICD-10-CM

## 2015-09-05 DIAGNOSIS — F32A Depression, unspecified: Secondary | ICD-10-CM

## 2015-09-05 DIAGNOSIS — I10 Essential (primary) hypertension: Secondary | ICD-10-CM

## 2015-09-05 DIAGNOSIS — F329 Major depressive disorder, single episode, unspecified: Secondary | ICD-10-CM | POA: Diagnosis not present

## 2015-09-05 DIAGNOSIS — J209 Acute bronchitis, unspecified: Secondary | ICD-10-CM | POA: Diagnosis not present

## 2015-09-05 LAB — COMPLETE METABOLIC PANEL WITH GFR
ALBUMIN: 4.2 g/dL (ref 3.6–5.1)
ALT: 19 U/L (ref 6–29)
AST: 20 U/L (ref 10–35)
Alkaline Phosphatase: 101 U/L (ref 33–130)
BUN: 17 mg/dL (ref 7–25)
CALCIUM: 9.2 mg/dL (ref 8.6–10.4)
CHLORIDE: 103 mmol/L (ref 98–110)
CO2: 28 mmol/L (ref 20–31)
Creat: 1.31 mg/dL — ABNORMAL HIGH (ref 0.50–0.99)
GFR, EST AFRICAN AMERICAN: 49 mL/min — AB (ref >=60)
GFR, Est Non African American: 42 mL/min — ABNORMAL LOW (ref >=60)
GLUCOSE: 80 mg/dL (ref 70–99)
POTASSIUM: 5.2 mmol/L (ref 3.5–5.3)
Sodium: 141 mmol/L (ref 135–146)
Total Bilirubin: 0.2 mg/dL (ref 0.2–1.2)
Total Protein: 7.3 g/dL (ref 6.1–8.1)

## 2015-09-05 MED ORDER — AZITHROMYCIN 250 MG PO TABS: ORAL_TABLET | ORAL | 0 refills | Status: DC

## 2015-09-05 MED ORDER — PREDNISONE 20 MG PO TABS: 20.0000 mg | ORAL_TABLET | Freq: Every day | ORAL | 0 refills | Status: DC

## 2015-09-05 MED ORDER — ALBUTEROL SULFATE HFA 108 (90 BASE) MCG/ACT IN AERS: 2.0000 | INHALATION_SPRAY | Freq: Four times a day (QID) | RESPIRATORY_TRACT | 4 refills | Status: DC | PRN

## 2015-09-05 NOTE — Progress Notes (Signed)
Patient ID: [name] MRN: [id number], DOB: 1945/07/02, 70 y.o. Date of Encounter: @DATE @  Chief Complaint:  Chief Complaint  Patient presents with  . Medication Refill    HPI: 70 y.o. year old white female  presents for f/u OV.    03/28/2015 OV: She had office visit with me 02/28/15 at which time she reported that over the past couple of years she been having diarrhea off and on. Also having significant urgency and even occasional diarrhea and her underwear. Also having crampy abdominal pains and, bloating. At that visit I obtained extensive lab including H pylori, CBC, CMET, TSH, celiac panel, amylase, lipase. Labs were normal. At that time I had her stop her metformin-- the dosage was just 500 mg twice a day. At time had her replace this with Actos. Today she states that she feels so much better. Says that she feels great. Says that she is having no further diarrhea since being off of the metformin. No further abdominal cramping and no further bloating.  Says that she occasionally feels a little bit of pain in the epigastric region and occasionally feels like she has a little difficulty swallowing. However she says that this is very random and does not happen on a routine basis. Says that she can swallow meat without feeling like it is getting stuck. We discussed going ahead and having her follow-up with GI for possible endoscopy that she states that she will monitor this and call me if it worsens. At this time she is already on PPI and is avoiding NSAIDs.  Today I reviewed chart and realized that she was seen by me as a new patient 01/12/15. Reviewed that at that time her lipid panel showed her cholesterol to be high and she then informed us that she had been off of for her statin. Today she states that she has been back on this- since that phone call. Will go ahead and recheck those labs for follow-up.  Also today she says that she has had a couple of episodes of feeling panic  attack over the last 6 months.  States that both of these episodes occurred when she was in the presence of her son. Says that she does not usually see him. I did not start asking questions were go into any further detail of their history but she clearly states that the 2 times that she has felt panic have been prompted by family members. Reviewed my note from 01/12/15. Reviewed that she is already on Effexor and Wellbutrin. At that visit reported that she had been on meds since death of her daughter. See note 01/12/15 for further info.   No other complaints or concerns.    09/05/2015: She reports that "her bronchitis has been acting up ". Says that she needed refill on her albuterol inhaler but we wouldn't refill it without her coming in. Says that she is using her Symbicort. Says that she has had these symptoms for about 1-1/2 weeks. She is coughing up phlegm. No fevers or chills.  Says that a while back she ran out of Wellbutrin and she ended up just being off of it for a couple weeks and realized that she was feeling better so she has just stayed off of it and thinks that she is feeling better off of that and has been off of it for 2 months.  Says that she is supposed to be having foot surgery August 23--comments "was hoping I wasn't done a have  to have another surgery ".  No other specific complaints or concerns today.  She is taking diabetic medication as directed. No adverse effects. She is taking blood pressure medication as directed with no adverse effect. She is taking Lipitor as directed. No myalgias or other adverse effects.    Past Medical History:  Diagnosis Date  . Allergy   . Anxiety   . Asthma   . Bursitis   . Cataract   . COPD (chronic obstructive pulmonary disease) ([place])   . Depression   . Diabetes mellitus   . Hiatal hernia   . Hypercholesteremia   . Hypertension   . Shortness of breath    chronic brochitis  . Ulcer      Home Meds: Outpatient Medications  Prior to Visit  Medication Sig Dispense Refill  . ALPRAZolam (XANAX) 0.25 MG tablet Take 1 tablet (0.25 mg total) by mouth 2 (two) times daily as needed for anxiety. 20 tablet 0  . atorvastatin (LIPITOR) 80 MG tablet Take 1 tablet (80 mg total) by mouth daily. 90 tablet 1  . budesonide-formoterol (SYMBICORT) 160-4.5 MCG/ACT inhaler Inhale 2 puffs into the lungs.    . cyclobenzaprine (FLEXERIL) 10 MG tablet TAKE 1 TABLET EVERY 8 HOURS AS NEEDED FOR SPASM. 90 tablet 0  . glucose blood test strip 1 each by Other route 2 (two) times daily. Use as instructed 200 each 3  . HYDROcodone-acetaminophen (NORCO) 10-325 MG tablet Take 1 tablet by mouth every 8 (eight) hours as needed. 30 tablet 0  . lisinopril (PRINIVIL,ZESTRIL) 40 MG tablet Take 1 tablet (40 mg total) by mouth daily. 90 tablet 3  . montelukast (SINGULAIR) 10 MG tablet Take 10 mg by mouth at bedtime.    . pioglitazone (ACTOS) 15 MG tablet TAKE ONE TABLET BY MOUTH ONCE DAILY. 90 tablet 0  . traZODone (DESYREL) 50 MG tablet Take 3 tablets (150 mg total) by mouth at bedtime as needed for sleep. 270 tablet 3  . albuterol (PROVENTIL HFA;VENTOLIN HFA) 108 (90 BASE) MCG/ACT inhaler Inhale 2 puffs into the lungs every 6 (six) hours as needed for wheezing or shortness of breath.    Marland Kitchen aspirin EC 81 MG tablet Take 81 mg by mouth daily.    . BuPROPion HCl (WELLBUTRIN PO) Take 1 tablet by mouth at bedtime.    . diclofenac (VOLTAREN) 75 MG EC tablet Take 1 tablet (75 mg total) by mouth 2 (two) times daily. (Patient not taking: Reported on 09/05/2015) 50 tablet 2  . HYDROcodone-acetaminophen (NORCO) 10-325 MG tablet Take 1 tablet by mouth every 8 (eight) hours as needed. (Patient not taking: Reported on 09/05/2015) 30 tablet 0  . HYDROcodone-acetaminophen (NORCO) 10-325 MG tablet Take one tablet every 8 hours as needed for pain. (Patient not taking: Reported on 09/05/2015) 30 tablet 0  . omeprazole (PRILOSEC) 20 MG capsule Take 20 mg by mouth 2 (two) times daily  before a meal.    . venlafaxine (EFFEXOR) 75 MG tablet Take 75 mg by mouth daily.      Marland Kitchen venlafaxine XR (EFFEXOR-XR) 75 MG 24 hr capsule TAKE 2 CAPSULES BY MOUTH DAILY. (Patient not taking: Reported on 09/05/2015) 180 capsule 0   No facility-administered medications prior to visit.     Allergies:  Allergies  Allergen Reactions  . Codeine Itching  . Flonase [Fluticasone Propionate]     Severe coughing  . Metformin And Related Diarrhea    Caused severe diarrhea and severe GI symptoms---even on low dose 500mg  BID  Social History   Social History  . Marital status: Single    Spouse name: N/A  . Number of children: N/A  . Years of education: N/A   Occupational History  . Not on file.   Social History Main Topics  . Smoking status: Current Every Day Smoker    Packs/day: 0.50    Years: 52.00    Types: Cigarettes  . Smokeless tobacco: Never Used  . Alcohol use No  . Drug use:     Frequency: 3.0 times per week    Types: Marijuana     Comment: 1-3 times week  . Sexual activity: Not Currently    Birth control/ protection: Surgical   Other Topics Concern  . Not on file   Social History Narrative  . No narrative on file    Family History  Problem Relation Age of Onset  . Breast cancer Daughter   . Anesthesia problems Neg Hx   . Hypotension Neg Hx   . Malignant hyperthermia Neg Hx   . Pseudochol deficiency Neg Hx   . Heart disease Mother   . Hyperlipidemia Mother   . Hyperlipidemia Father   . Cancer Sister   . Birth defects Grandchild      Review of Systems:  See HPI for pertinent ROS. All other ROS negative.    Physical Exam: Blood pressure 138/80, pulse 78, temperature 98.7 F (37.1 C), temperature source Oral, resp. rate 16, height 5' 5.75" (1.67 m), weight 206 lb (93.4 kg), SpO2 97 %., Body mass index is 33.5 kg/m. General: WNWD WF. Appears in no acute distress. Neck: Supple. No thyromegaly. No lymphadenopathy. Lungs: Very slight wheeze scattered  bilaterally.  Heart: RRR with S1 S2. No murmurs, rubs, or gallops. Abdomen: Soft, non-tender, non-distended with normoactive bowel sounds. No hepatomegaly. No rebound/guarding. No obvious abdominal masses. Musculoskeletal:  Strength and tone normal for age. Extremities/Skin: Warm and dry. No clubbing or cyanosis. No edema. No rashes or suspicious lesions. Neuro: Alert and oriented X 3. Moves all extremities spontaneously. Gait is normal. CNII-XII grossly in tact. Psych:  Responds to questions appropriately with a normal affect.     ASSESSMENT AND PLAN:  70 y.o. year old female with  Smoker Have discussed need for cessation multiple occasions  Acute bronchitis, unspecified organism She is to take antibiotic and prednisone as directed. Use albuterol-- if needed-- as directed. Continue Symbicort. Follow up if breathing does not return to baseline in 1-2 weeks. - azithromycin (ZITHROMAX) 250 MG tablet; Day 1: Take 2 daily. Days 2-5: Take 1 daily.  Dispense: 6 tablet; Refill: 0 - albuterol (PROVENTIL HFA;VENTOLIN HFA) 108 (90 Base) MCG/ACT inhaler; Inhale 2 puffs into the lungs every 6 (six) hours as needed for wheezing or shortness of breath.  Dispense: 1 Inhaler; Refill: 4 - predniSONE (DELTASONE) 20 MG tablet; Take 1 tablet (20 mg total) by mouth daily with breakfast.  Dispense: 5 tablet; Refill: 0    H/O Diarrhea, unspecified type Resolved.  No Metformin---added to "Allergy list"  Panic disorder Discussed with her to take this medication only when she is feeling severe panic. Explained not to take this medication prior to driving. - ALPRAZolam (XANAX) 0.25 MG tablet; Take 1 tablet (0.25 mg total) by mouth 2 (two) times daily as needed for anxiety.  Dispense: 20 tablet; Refill: 0 At visit 09/05/15 she reports that she went off Wellbutrin about 2 months ago and actually has felt better off of this medication. Therefore Wellbutrin removed from medication list at this visit.  Abdominal  bloating Resolved. Stay off Metformin.  Abdominal pain, epigastric Continue  PPI. Continue to avoid NSAIDs.   Dysphagia She reports that this only occurs occasionally and does not occur with any pattern. If this worsens or does not resolve she will call and I will refer her to GI.  Depression Stable/controlled with current medication. At visit 09/05/15 she reports that she went off Wellbutrin about 2 months ago and actually has felt better off of this medication. Therefore Wellbutrin removed from medication list at this visit.   Diabetes mellitus without complication [facility]) She had diabetic office visit with me 12/2014. See that note for details of this. Will check A1c while she is here today. - Hemoglobin A1c  Hypercholesteremia At labs 12/2014 LDL was high and she then informed us that she had been off of her statin. At that time, she resumed statin. Recheck lab now. - Hepatic function panel - Lipid panel  Essential hypertension Blood pressure control/at goal. Continue current medications. She just had renal function checked in lab 02/28/15 and that was stable.    Schedule routine follow-up visit 3 months. Follow-up sooner if needed.   [address] [place], [place], [facility] 09/05/2015 12:54 PM

## 2015-09-06 LAB — HEMOGLOBIN A1C
Hgb A1c MFr Bld: 5.9 % — ABNORMAL HIGH (ref <5.7)
Mean Plasma Glucose: 123 mg/dL

## 2015-09-12 ENCOUNTER — Telehealth: Payer: Self-pay | Admitting: *Deleted

## 2015-09-12 ENCOUNTER — Ambulatory Visit
Admission: RE | Admit: 2015-09-12 | Discharge: 2015-09-12 | Disposition: A | Discharge status: Home / Self Care | Payer: Commercial Managed Care - HMO | Source: Ambulatory Visit | Attending: Family Medicine | Admitting: Family Medicine

## 2015-09-12 DIAGNOSIS — Z780 Asymptomatic menopausal state: Secondary | ICD-10-CM | POA: Diagnosis not present

## 2015-09-12 DIAGNOSIS — Z13820 Encounter for screening for osteoporosis: Secondary | ICD-10-CM | POA: Diagnosis not present

## 2015-09-12 DIAGNOSIS — Z1239 Encounter for other screening for malignant neoplasm of breast: Secondary | ICD-10-CM

## 2015-09-12 DIAGNOSIS — Z1231 Encounter for screening mammogram for malignant neoplasm of breast: Secondary | ICD-10-CM | POA: Diagnosis not present

## 2015-09-12 DIAGNOSIS — E2839 Other primary ovarian failure: Secondary | ICD-10-CM

## 2015-09-12 NOTE — Telephone Encounter (Signed)
"  Surgery can't be done here.  Implant costs $8000.  Reimbursement for multiple codes will not cover reimbursement for this.  We are going to take this case off for Wednesday."

## 2015-09-13 NOTE — Telephone Encounter (Signed)
I'm calling to let you know we're going to have to reschedule your surgery that is scheduled for tomorrow.  You insurance will not cover that expenses for the implant that Dr. Jacqualyn Posey wants to use.  We're going to have to reschedule your surgery to [place].  Dr. Jacqualyn Posey is trying to come up with a date that he can do there surgery at [facility].  "You'll let me know when it is scheduled?"  Yes, we will let you know.  Also, [facility] requires history and physical forms to be completed prior to surgery.  You have to have it completed and signed by your primary care physician.  Physical had to be performed within 30 days.  "I just saw the doctor last week, he did some blood work and a check up.  Can you mail me the form?"  Yes, I'll mail you the form.  History and physical form was mailed to patient.

## 2015-09-19 ENCOUNTER — Other Ambulatory Visit: Payer: Self-pay | Admitting: Family Medicine

## 2015-09-19 MED ORDER — LISINOPRIL 40 MG PO TABS: 40.0000 mg | ORAL_TABLET | Freq: Every day | ORAL | 1 refills | Status: DC

## 2015-09-19 MED ORDER — VENLAFAXINE HCL ER 75 MG PO CP24
150.0000 mg | ORAL_CAPSULE | Freq: Every day | ORAL | 0 refills | Status: DC
Start: 2015-09-19 — End: 2015-12-22

## 2015-09-19 MED ORDER — PIOGLITAZONE HCL 15 MG PO TABS: 15.0000 mg | ORAL_TABLET | Freq: Every day | ORAL | 1 refills | Status: DC

## 2015-09-19 MED ORDER — TRAZODONE HCL 50 MG PO TABS: 150.0000 mg | ORAL_TABLET | Freq: Every evening | ORAL | 0 refills | Status: DC | PRN

## 2015-09-19 NOTE — Telephone Encounter (Signed)
Medication refilled per protocol. 

## 2015-09-21 MED ORDER — HYDROCODONE-ACETAMINOPHEN 10-325 MG PO TABS: 1.0000 | ORAL_TABLET | Freq: Three times a day (TID) | ORAL | 0 refills | Status: DC | PRN

## 2015-10-06 ENCOUNTER — Encounter (HOSPITAL_BASED_OUTPATIENT_CLINIC_OR_DEPARTMENT_OTHER): Payer: Self-pay | Admitting: *Deleted

## 2015-10-07 ENCOUNTER — Telehealth: Payer: Self-pay | Admitting: *Deleted

## 2015-10-07 NOTE — Telephone Encounter (Signed)
"  I am calling about the surgery I am supposed to have.  Please give me a call back, thank you."  "I have a patient of Dr. Jacqualyn Posey, Rolm Gala, coming in on the 20th.  I just did her pre-op phone call and she said she did get her history and physical done and it was taken to the office.  I need that faxed over to me."

## 2015-10-10 ENCOUNTER — Other Ambulatory Visit: Payer: Self-pay

## 2015-10-10 ENCOUNTER — Encounter (HOSPITAL_BASED_OUTPATIENT_CLINIC_OR_DEPARTMENT_OTHER)
Admission: RE | Admit: 2015-10-10 | Discharge: 2015-10-10 | Disposition: A | Discharge status: Home / Self Care | Payer: Commercial Managed Care - HMO | Source: Ambulatory Visit | Attending: Podiatry | Admitting: Podiatry

## 2015-10-10 DIAGNOSIS — M65872 Other synovitis and tenosynovitis, left ankle and foot: Secondary | ICD-10-CM | POA: Diagnosis not present

## 2015-10-10 DIAGNOSIS — G4730 Sleep apnea, unspecified: Secondary | ICD-10-CM | POA: Diagnosis not present

## 2015-10-10 DIAGNOSIS — J449 Chronic obstructive pulmonary disease, unspecified: Secondary | ICD-10-CM | POA: Diagnosis not present

## 2015-10-10 DIAGNOSIS — I10 Essential (primary) hypertension: Secondary | ICD-10-CM | POA: Diagnosis not present

## 2015-10-10 DIAGNOSIS — M19072 Primary osteoarthritis, left ankle and foot: Secondary | ICD-10-CM | POA: Diagnosis not present

## 2015-10-10 DIAGNOSIS — F419 Anxiety disorder, unspecified: Secondary | ICD-10-CM | POA: Diagnosis not present

## 2015-10-10 DIAGNOSIS — F17210 Nicotine dependence, cigarettes, uncomplicated: Secondary | ICD-10-CM | POA: Diagnosis not present

## 2015-10-10 DIAGNOSIS — Z6834 Body mass index (BMI) 34.0-34.9, adult: Secondary | ICD-10-CM | POA: Diagnosis not present

## 2015-10-10 DIAGNOSIS — M25572 Pain in left ankle and joints of left foot: Secondary | ICD-10-CM | POA: Diagnosis not present

## 2015-10-10 DIAGNOSIS — E669 Obesity, unspecified: Secondary | ICD-10-CM | POA: Diagnosis not present

## 2015-10-10 DIAGNOSIS — I4510 Unspecified right bundle-branch block: Secondary | ICD-10-CM | POA: Diagnosis not present

## 2015-10-10 DIAGNOSIS — F329 Major depressive disorder, single episode, unspecified: Secondary | ICD-10-CM | POA: Diagnosis not present

## 2015-10-10 DIAGNOSIS — M898X7 Other specified disorders of bone, ankle and foot: Secondary | ICD-10-CM | POA: Diagnosis not present

## 2015-10-10 DIAGNOSIS — K449 Diaphragmatic hernia without obstruction or gangrene: Secondary | ICD-10-CM | POA: Diagnosis not present

## 2015-10-10 DIAGNOSIS — E119 Type 2 diabetes mellitus without complications: Secondary | ICD-10-CM | POA: Diagnosis not present

## 2015-10-10 DIAGNOSIS — E785 Hyperlipidemia, unspecified: Secondary | ICD-10-CM | POA: Diagnosis not present

## 2015-10-10 DIAGNOSIS — I499 Cardiac arrhythmia, unspecified: Secondary | ICD-10-CM | POA: Diagnosis not present

## 2015-10-10 LAB — BASIC METABOLIC PANEL
ANION GAP: 10 (ref 5–15)
BUN: 19 mg/dL (ref 6–20)
CO2: 25 mmol/L (ref 22–32)
Calcium: 9.5 mg/dL (ref 8.9–10.3)
Chloride: 103 mmol/L (ref 101–111)
Creatinine, Ser: 1.29 mg/dL — ABNORMAL HIGH (ref 0.44–1.00)
GFR calc Af Amer: 49 mL/min — ABNORMAL LOW (ref >60)
GFR calc non Af Amer: 42 mL/min — ABNORMAL LOW (ref >60)
GLUCOSE: 84 mg/dL (ref 65–99)
POTASSIUM: 4.7 mmol/L (ref 3.5–5.1)
Sodium: 138 mmol/L (ref 135–145)

## 2015-10-11 NOTE — Progress Notes (Signed)
Dr Al Corpus reviewed abnormal lab no further treatment needed

## 2015-10-12 ENCOUNTER — Encounter (HOSPITAL_BASED_OUTPATIENT_CLINIC_OR_DEPARTMENT_OTHER): Payer: Self-pay | Admitting: Certified Registered"

## 2015-10-12 ENCOUNTER — Ambulatory Visit (HOSPITAL_BASED_OUTPATIENT_CLINIC_OR_DEPARTMENT_OTHER): Payer: Commercial Managed Care - HMO | Admitting: Certified Registered"

## 2015-10-12 ENCOUNTER — Encounter (HOSPITAL_BASED_OUTPATIENT_CLINIC_OR_DEPARTMENT_OTHER)
Admission: RE | Disposition: A | Discharge status: Home / Self Care | Payer: Self-pay | Source: Ambulatory Visit | Attending: Podiatry

## 2015-10-12 ENCOUNTER — Encounter: Payer: Self-pay | Admitting: Podiatry

## 2015-10-12 ENCOUNTER — Ambulatory Visit (HOSPITAL_BASED_OUTPATIENT_CLINIC_OR_DEPARTMENT_OTHER)
Admission: RE | Admit: 2015-10-12 | Discharge: 2015-10-12 | Disposition: A | Discharge status: Home / Self Care | Payer: Commercial Managed Care - HMO | Source: Ambulatory Visit | Attending: Podiatry | Admitting: Podiatry

## 2015-10-12 DIAGNOSIS — M25572 Pain in left ankle and joints of left foot: Secondary | ICD-10-CM | POA: Insufficient documentation

## 2015-10-12 DIAGNOSIS — G8918 Other acute postprocedural pain: Secondary | ICD-10-CM | POA: Diagnosis not present

## 2015-10-12 DIAGNOSIS — F419 Anxiety disorder, unspecified: Secondary | ICD-10-CM | POA: Insufficient documentation

## 2015-10-12 DIAGNOSIS — M93272 Osteochondritis dissecans, left ankle and joints of left foot: Secondary | ICD-10-CM | POA: Diagnosis not present

## 2015-10-12 DIAGNOSIS — E669 Obesity, unspecified: Secondary | ICD-10-CM | POA: Insufficient documentation

## 2015-10-12 DIAGNOSIS — M19072 Primary osteoarthritis, left ankle and foot: Secondary | ICD-10-CM | POA: Diagnosis not present

## 2015-10-12 DIAGNOSIS — M898X7 Other specified disorders of bone, ankle and foot: Secondary | ICD-10-CM | POA: Diagnosis not present

## 2015-10-12 DIAGNOSIS — I10 Essential (primary) hypertension: Secondary | ICD-10-CM | POA: Insufficient documentation

## 2015-10-12 DIAGNOSIS — J449 Chronic obstructive pulmonary disease, unspecified: Secondary | ICD-10-CM | POA: Insufficient documentation

## 2015-10-12 DIAGNOSIS — Z6834 Body mass index (BMI) 34.0-34.9, adult: Secondary | ICD-10-CM | POA: Insufficient documentation

## 2015-10-12 DIAGNOSIS — J45909 Unspecified asthma, uncomplicated: Secondary | ICD-10-CM | POA: Diagnosis not present

## 2015-10-12 DIAGNOSIS — I4510 Unspecified right bundle-branch block: Secondary | ICD-10-CM | POA: Insufficient documentation

## 2015-10-12 DIAGNOSIS — G4730 Sleep apnea, unspecified: Secondary | ICD-10-CM | POA: Insufficient documentation

## 2015-10-12 DIAGNOSIS — F329 Major depressive disorder, single episode, unspecified: Secondary | ICD-10-CM | POA: Diagnosis not present

## 2015-10-12 DIAGNOSIS — F17210 Nicotine dependence, cigarettes, uncomplicated: Secondary | ICD-10-CM | POA: Insufficient documentation

## 2015-10-12 DIAGNOSIS — M65872 Other synovitis and tenosynovitis, left ankle and foot: Secondary | ICD-10-CM | POA: Insufficient documentation

## 2015-10-12 DIAGNOSIS — K449 Diaphragmatic hernia without obstruction or gangrene: Secondary | ICD-10-CM | POA: Insufficient documentation

## 2015-10-12 DIAGNOSIS — E119 Type 2 diabetes mellitus without complications: Secondary | ICD-10-CM | POA: Insufficient documentation

## 2015-10-12 DIAGNOSIS — E785 Hyperlipidemia, unspecified: Secondary | ICD-10-CM | POA: Insufficient documentation

## 2015-10-12 DIAGNOSIS — I499 Cardiac arrhythmia, unspecified: Secondary | ICD-10-CM | POA: Insufficient documentation

## 2015-10-12 HISTORY — PX: ANKLE ARTHROSCOPY WITH DRILLING/MICROFRACTURE: SHX5580

## 2015-10-12 LAB — GLUCOSE, CAPILLARY: GLUCOSE-CAPILLARY: 137 mg/dL — AB (ref 65–99)

## 2015-10-12 SURGERY — ARTHROSCOPY, ANKLE, WITH MICROFRACTURE
Anesthesia: Regional | Site: Foot | Laterality: Left

## 2015-10-12 MED ORDER — CHLORHEXIDINE GLUCONATE CLOTH 2 % EX PADS: 6.0000 | MEDICATED_PAD | Freq: Once | CUTANEOUS | Status: DC

## 2015-10-12 MED ORDER — DEXAMETHASONE SODIUM PHOSPHATE 10 MG/ML IJ SOLN
INTRAMUSCULAR | Status: DC | PRN
  Administered 2015-10-12: 4 mg via INTRAVENOUS

## 2015-10-12 MED ORDER — SCOPOLAMINE 1 MG/3DAYS TD PT72: 1.0000 | MEDICATED_PATCH | Freq: Once | TRANSDERMAL | Status: DC | PRN

## 2015-10-12 MED ORDER — FENTANYL CITRATE (PF) 100 MCG/2ML IJ SOLN
INTRAMUSCULAR | Status: AC
  Filled 2015-10-12: qty 2

## 2015-10-12 MED ORDER — PROMETHAZINE HCL 25 MG/ML IJ SOLN: 6.2500 mg | INTRAMUSCULAR | Status: DC | PRN

## 2015-10-12 MED ORDER — EVICEL 2 ML EX KIT
PACK | CUTANEOUS | Status: AC
  Filled 2015-10-12: qty 1

## 2015-10-12 MED ORDER — CEFAZOLIN SODIUM-DEXTROSE 2-4 GM/100ML-% IV SOLN
INTRAVENOUS | Status: AC
  Filled 2015-10-12: qty 100

## 2015-10-12 MED ORDER — PHENYLEPHRINE 40 MCG/ML (10ML) SYRINGE FOR IV PUSH (FOR BLOOD PRESSURE SUPPORT)
PREFILLED_SYRINGE | INTRAVENOUS | Status: AC
  Filled 2015-10-12: qty 10

## 2015-10-12 MED ORDER — MIDAZOLAM HCL 2 MG/2ML IJ SOLN
INTRAMUSCULAR | Status: AC
  Filled 2015-10-12: qty 2

## 2015-10-12 MED ORDER — GLYCOPYRROLATE 0.2 MG/ML IJ SOLN: 0.2000 mg | Freq: Once | INTRAMUSCULAR | Status: DC | PRN

## 2015-10-12 MED ORDER — CEFAZOLIN SODIUM-DEXTROSE 2-4 GM/100ML-% IV SOLN
2.0000 g | INTRAVENOUS | Status: AC
  Administered 2015-10-12: 2 g via INTRAVENOUS

## 2015-10-12 MED ORDER — LIDOCAINE HCL (CARDIAC) 20 MG/ML IV SOLN
INTRAVENOUS | Status: DC | PRN
  Administered 2015-10-12: 30 mg via INTRAVENOUS

## 2015-10-12 MED ORDER — EPHEDRINE 5 MG/ML INJ
INTRAVENOUS | Status: AC
  Filled 2015-10-12: qty 10

## 2015-10-12 MED ORDER — MEPERIDINE HCL 50 MG PO TABS: 50.0000 mg | ORAL_TABLET | ORAL | 0 refills | Status: DC | PRN

## 2015-10-12 MED ORDER — PROPOFOL 10 MG/ML IV BOLUS
INTRAVENOUS | Status: DC | PRN
Start: 2015-10-12 — End: 2015-10-12
  Administered 2015-10-12: 200 mg via INTRAVENOUS

## 2015-10-12 MED ORDER — BUPIVACAINE HCL (PF) 0.5 % IJ SOLN
INTRAMUSCULAR | Status: AC
  Filled 2015-10-12: qty 30

## 2015-10-12 MED ORDER — LACTATED RINGERS IV SOLN
INTRAVENOUS | Status: DC
  Administered 2015-10-12: 11:00:00 via INTRAVENOUS

## 2015-10-12 MED ORDER — EPHEDRINE SULFATE 50 MG/ML IJ SOLN
INTRAMUSCULAR | Status: DC | PRN
  Administered 2015-10-12 (×3): 10 mg via INTRAVENOUS

## 2015-10-12 MED ORDER — ONDANSETRON HCL 4 MG/2ML IJ SOLN
INTRAMUSCULAR | Status: DC | PRN
  Administered 2015-10-12: 4 mg via INTRAVENOUS

## 2015-10-12 MED ORDER — CEPHALEXIN 500 MG PO CAPS: 500.0000 mg | ORAL_CAPSULE | Freq: Two times a day (BID) | ORAL | 0 refills | Status: DC

## 2015-10-12 MED ORDER — MIDAZOLAM HCL 2 MG/2ML IJ SOLN
1.0000 mg | INTRAMUSCULAR | Status: DC | PRN
  Administered 2015-10-12: 1 mg via INTRAVENOUS

## 2015-10-12 MED ORDER — FENTANYL CITRATE (PF) 100 MCG/2ML IJ SOLN: 25.0000 ug | INTRAMUSCULAR | Status: DC | PRN

## 2015-10-12 MED ORDER — PROMETHAZINE HCL 12.5 MG PO TABS: 12.5000 mg | ORAL_TABLET | Freq: Three times a day (TID) | ORAL | 0 refills | Status: DC | PRN

## 2015-10-12 MED ORDER — FENTANYL CITRATE (PF) 100 MCG/2ML IJ SOLN
50.0000 ug | INTRAMUSCULAR | Status: AC | PRN
  Administered 2015-10-12 (×4): 25 ug via INTRAVENOUS
  Administered 2015-10-12: 50 ug via INTRAVENOUS

## 2015-10-12 MED ORDER — BUPIVACAINE-EPINEPHRINE (PF) 0.5% -1:200000 IJ SOLN
INTRAMUSCULAR | Status: DC | PRN
  Administered 2015-10-12: 30 mL via PERINEURAL
  Administered 2015-10-12: 20 mL via PERINEURAL

## 2015-10-12 MED ORDER — LIDOCAINE HCL 2 % IJ SOLN
INTRAMUSCULAR | Status: AC
  Filled 2015-10-12: qty 20

## 2015-10-12 SURGICAL SUPPLY — 61 items
BANDAGE ACE 3X5.8 VEL STRL LF (GAUZE/BANDAGES/DRESSINGS) ×3 IMPLANT
BANDAGE ACE 4X5 VEL STRL LF (GAUZE/BANDAGES/DRESSINGS) ×6 IMPLANT
BLADE SURG 15 STRL LF DISP TIS (BLADE) ×4 IMPLANT
BLADE SURG 15 STRL SS (BLADE) ×2
BNDG CONFORM 2 STRL LF (GAUZE/BANDAGES/DRESSINGS) ×3 IMPLANT
BNDG ESMARK 4X9 LF (GAUZE/BANDAGES/DRESSINGS) ×3 IMPLANT
BNDG GAUZE ELAST 4 BULKY (GAUZE/BANDAGES/DRESSINGS) ×3 IMPLANT
BOOT STEPPER DURA MED (SOFTGOODS) ×3 IMPLANT
BUR CUDA 2.9 (BURR) IMPLANT
BUR FULL RADIUS 2.9 (BURR) ×3 IMPLANT
BUR GATOR 2.9 (BURR) IMPLANT
BUR SPHERICAL 2.9 (BURR) IMPLANT
COVER BACK TABLE 60X90IN (DRAPES) ×3 IMPLANT
CUFF TOURNIQUET SINGLE 18IN (TOURNIQUET CUFF) IMPLANT
CUFF TOURNIQUET SINGLE 34IN LL (TOURNIQUET CUFF) ×3 IMPLANT
DRAPE ARTHROSCOPY W/POUCH 114 (DRAPES) ×3 IMPLANT
DRAPE EXTREMITY T 121X128X90 (DRAPE) IMPLANT
DRAPE IMP U-DRAPE 54X76 (DRAPES) ×3 IMPLANT
DRAPE OEC MINIVIEW 54X84 (DRAPES) IMPLANT
DRAPE SURG 17X23 STRL (DRAPES) ×3 IMPLANT
DRAPE U-SHAPE 47X51 STRL (DRAPES) ×3 IMPLANT
DRSG EMULSION OIL 3X3 NADH (GAUZE/BANDAGES/DRESSINGS) ×3 IMPLANT
DURAPREP 26ML APPLICATOR (WOUND CARE) ×3 IMPLANT
ELECT REM PT RETURN 9FT ADLT (ELECTROSURGICAL)
ELECTRODE REM PT RTRN 9FT ADLT (ELECTROSURGICAL) IMPLANT
GAUZE SPONGE 4X4 12PLY STRL (GAUZE/BANDAGES/DRESSINGS) ×3 IMPLANT
GAUZE SPONGE 4X4 16PLY XRAY LF (GAUZE/BANDAGES/DRESSINGS) IMPLANT
GLOVE BIO SURGEON STRL SZ7.5 (GLOVE) ×6 IMPLANT
GLOVE BIO SURGEON STRL SZ8 (GLOVE) ×3 IMPLANT
GLOVE BIOGEL PI IND STRL 8 (GLOVE) ×2 IMPLANT
GLOVE BIOGEL PI INDICATOR 8 (GLOVE) ×1
GOWN STRL REUS W/ TWL LRG LVL3 (GOWN DISPOSABLE) ×2 IMPLANT
GOWN STRL REUS W/ TWL XL LVL3 (GOWN DISPOSABLE) ×4 IMPLANT
GOWN STRL REUS W/TWL LRG LVL3 (GOWN DISPOSABLE) ×1
GOWN STRL REUS W/TWL XL LVL3 (GOWN DISPOSABLE) ×2
MANIFOLD NEPTUNE II (INSTRUMENTS) ×3 IMPLANT
NDL SAFETY ECLIPSE 18X1.5 (NEEDLE) IMPLANT
NEEDLE HYPO 18GX1.5 SHARP (NEEDLE)
NEEDLE HYPO 25X1 1.5 SAFETY (NEEDLE) IMPLANT
NEEDLE SPNL 18GX3.5 QUINCKE PK (NEEDLE) ×3 IMPLANT
NS IRRIG 1000ML POUR BTL (IV SOLUTION) IMPLANT
PACK BASIN DAY SURGERY FS (CUSTOM PROCEDURE TRAY) ×3 IMPLANT
PADDING CAST ABS 4INX4YD NS (CAST SUPPLIES) ×2
PADDING CAST ABS COTTON 4X4 ST (CAST SUPPLIES) ×4 IMPLANT
PENCIL BUTTON HOLSTER BLD 10FT (ELECTRODE) IMPLANT
SET ARTHROSCOPY TUBING (MISCELLANEOUS) ×1
SET ARTHROSCOPY TUBING LN (MISCELLANEOUS) ×2 IMPLANT
SLEEVE SCD COMPRESS KNEE MED (MISCELLANEOUS) IMPLANT
SPLINT FIBERGLASS 4X30 (CAST SUPPLIES) ×3 IMPLANT
STAPLER VISISTAT 35W (STAPLE) IMPLANT
STOCKINETTE 6  STRL (DRAPES) ×1
STOCKINETTE 6 STRL (DRAPES) ×2 IMPLANT
STRAP ANKLE FOOT DISTRACTOR (ORTHOPEDIC SUPPLIES) ×3 IMPLANT
SUT ETHILON 4 0 PS 2 18 (SUTURE) ×3 IMPLANT
SUT MNCRL AB 3-0 PS2 18 (SUTURE) IMPLANT
SUT MNCRL AB 4-0 PS2 18 (SUTURE) IMPLANT
SUT MON AB 5-0 PS2 18 (SUTURE) IMPLANT
SUT PROLENE 4 0 PS 2 18 (SUTURE) ×3 IMPLANT
SYR BULB 3OZ (MISCELLANEOUS) ×3 IMPLANT
SYRINGE 10CC LL (SYRINGE) ×3 IMPLANT
UNDERPAD 30X30 (UNDERPADS AND DIAPERS) ×3 IMPLANT

## 2015-10-12 NOTE — Progress Notes (Signed)
Assisted Dr. Neoma Laming with left, ultrasound guided, popliteal/saphenous block. Side rails up, monitors on throughout procedure. See vital signs in flow sheet. Tolerated Procedure well.

## 2015-10-12 NOTE — Anesthesia Preprocedure Evaluation (Addendum)
Anesthesia Evaluation  Patient identified by MRN, date of birth, ID band Patient awake    Reviewed: Allergy & Precautions, NPO status , Patient's Chart, lab work & pertinent test results  History of Anesthesia Complications Negative for: history of anesthetic complications  Airway Mallampati: III  TM Distance: >3 FB Neck ROM: Full    Dental  (+) Dental Advisory Given   Pulmonary asthma , sleep apnea , COPD,  COPD inhaler, neg recent URI, Current Smoker,    Pulmonary exam normal breath sounds clear to auscultation- rhonchi       Cardiovascular hypertension, Pt. on medications (-) angina(-) Past MI, (-) Cardiac Stents and (-) Orthopnea + dysrhythmias (RBBB)  Rhythm:Regular Rate:Normal  HLD   Neuro/Psych neg Seizures PSYCHIATRIC DISORDERS Anxiety Depression    GI/Hepatic Neg liver ROS, hiatal hernia,   Endo/Other  diabetes, Type 2  Renal/GU negative Renal ROS     Musculoskeletal  (+) Arthritis ,   Abdominal (+) + obese,   Peds  Hematology negative hematology ROS (+)   Anesthesia Other Findings   Reproductive/Obstetrics                           Anesthesia Physical Anesthesia Plan  ASA: III  Anesthesia Plan: General and Regional   Post-op Pain Management: GA combined w/ Regional for post-op pain   Induction: Intravenous  Airway Management Planned: LMA  Additional Equipment:   Intra-op Plan:   Post-operative Plan: Extubation in OR  Informed Consent: I have reviewed the patients History and Physical, chart, labs and discussed the procedure including the risks, benefits and alternatives for the proposed anesthesia with the patient or authorized representative who has indicated his/her understanding and acceptance.   Dental advisory given  Plan Discussed with: CRNA  Anesthesia Plan Comments: (Risks of general anesthesia discussed including, but not limited to, sore throat, hoarse  voice, chipped/damaged teeth, injury to vocal cords, nausea and vomiting, allergic reactions, lung infection, heart attack, stroke, and death. All questions answered.  Discussed potential risks of nerve blocks including, but not limited to, infection, bleeding, nerve damage, seizures, pneumothorax, respiratory depression, and potential failure of the block. Alternatives to nerve blocks discussed. All questions answered. )       Anesthesia Quick Evaluation

## 2015-10-12 NOTE — Anesthesia Procedure Notes (Signed)
Procedure Name: LMA Insertion Date/Time: 10/12/2015 12:50 PM Performed by: Phillipa Morden D Pre-anesthesia Checklist: Patient identified, Emergency Drugs available, Suction available and Patient being monitored Patient Re-evaluated:Patient Re-evaluated prior to inductionOxygen Delivery Method: Circle system utilized Preoxygenation: Pre-oxygenation with 100% oxygen Intubation Type: IV induction Ventilation: Mask ventilation without difficulty LMA: LMA inserted LMA Size: 4.0 Number of attempts: 1 Airway Equipment and Method: Bite block Placement Confirmation: positive ETCO2 Tube secured with: Tape Dental Injury: Teeth and Oropharynx as per pre-operative assessment

## 2015-10-12 NOTE — Anesthesia Procedure Notes (Signed)
Anesthesia Regional Block:  Nerve block type: Saphenous.  Pre-Anesthetic Checklist: ,, timeout performed, Correct Patient, Correct Site, Correct Laterality, Correct Procedure, Correct Position, site marked, Risks and benefits discussed,  Surgical consent,  Pre-op evaluation,  At surgeon's request and post-op pain management  Laterality: Left  Prep: chloraprep       Needles:  Injection technique: Single-shot  Needle Type: Stimiplex     Needle Length: 9cm 9 cm Needle Gauge: 21 and 21 G    Additional Needles:  Procedures: ultrasound guided (picture in chart) Nerve block type: Saphenous. Narrative:  Start time: 10/12/2015 12:25 PM End time: 10/12/2015 12:27 PM Injection made incrementally with aspirations every 5 mL.  Performed by: Personally  Anesthesiologist: Nilda Simmer

## 2015-10-12 NOTE — Discharge Instructions (Signed)
After Surgery Instructions   1) If you are recuperating from surgery anywhere other than home, please be sure to leave [place] the number where you can be reached.  2) Go directly home and rest.  3) Keep the operated foot(feet) elevated six inches above the hip when sitting or lying down. This will help control swelling and pain.  4) Support the elevated foot and leg with pillows. DO NOT PLACE PILLOWS UNDER THE KNEE.  5) DO NOT REMOVE or get your bandages WET, unless you were given different instructions by your doctor to do so. This increases the risk of infection.  6) Wear your surgical shoe or surgical boot at all times when you are up on your feet.  7) A limited amount of pain and swelling may occur. The skin may take on a bruised appearance. DO NOT BE ALARMED, THIS IS NORMAL.  8) For slight pain and swelling, apply an ice pack directly over the bandages for 15 minutes only out of each hour of the day. Continue until seen in the office for your first post op visit. DON NOT     APPLY ANY FORM OF HEAT TO THE AREA.  9) Have prescriptions filled immediately and take as directed.  10) Drink lots of liquids, water and juice to stay hydrated.  11) CALL IMMEDIATELY IF:  *Bleeding continues until the following day of surgery  *Pain increases and/or does not respond to medication  *Bandages or cast appears to tight  *If your bandage gets wet  *Trip, fall or stump your surgical foot  *If your temperature goes above 101  *If you have ANY questions at all  12) RESUME MEDICATIONS AS BEFORE SURGERY  13) DO NOT PUT WEIGHT ON YOUR SURGICAL FOOT. USE WHEELCHAIR AND WALKER  14) ICE/ELEVATION  YOU NOW CONTROL THE EFFORT OF YOUR RECOVERY. ADHERING TO THESE INSTRUCTIONS WILL OFFER YOU THE MOST COMPLETE RESULTS       Regional Anesthesia Blocks  1. Numbness or the inability to move the "blocked" extremity may last from 3-48 hours after placement. The length of time depends on the medication  injected and your individual response to the medication. If the numbness is not going away after 48 hours, call your surgeon.  2. The extremity that is blocked will need to be protected until the numbness is gone and the  Strength has returned. Because you cannot feel it, you will need to take extra care to avoid injury. Because it may be weak, you may have difficulty moving it or using it. You may not know what position it is in without looking at it while the block is in effect.  3. For blocks in the legs and feet, returning to weight bearing and walking needs to be done carefully. You will need to wait until the numbness is entirely gone and the strength has returned. You should be able to move your leg and foot normally before you try and bear weight or walk. You will need someone to be with you when you first try to ensure you do not fall and possibly risk injury.  4. Bruising and tenderness at the needle site are common side effects and will resolve in a few days.  5. Persistent numbness or new problems with movement should be communicated to the surgeon or the [facility] [phone] [place] [phone]).      Post Anesthesia Home Care Instructions  Activity: Get plenty of rest for the remainder of the  day. A responsible adult should stay with you for 24 hours following the procedure.  For the next 24 hours, DO NOT: -Drive a car -Paediatric nurse -Drink alcoholic beverages -Take any medication unless instructed by your physician -Make any legal decisions or sign important papers.  Meals: Start with liquid foods such as gelatin or soup. Progress to regular foods as tolerated. Avoid greasy, spicy, heavy foods. If nausea and/or vomiting occur, drink only clear liquids until the nausea and/or vomiting subsides. Call your physician if vomiting continues.  Special Instructions/Symptoms: Your throat may feel dry or sore from the anesthesia or the  breathing tube placed in your throat during surgery. If this causes discomfort, gargle with warm salt water. The discomfort should disappear within 24 hours.  If you had a scopolamine patch placed behind your ear for the management of post- operative nausea and/or vomiting:  1. The medication in the patch is effective for 72 hours, after which it should be removed.  Wrap patch in a tissue and discard in the trash. Wash hands thoroughly with soap and water. 2. You may remove the patch earlier than 72 hours if you experience unpleasant side effects which may include dry mouth, dizziness or visual disturbances. 3. Avoid touching the patch. Wash your hands with soap and water after contact with the patch.

## 2015-10-12 NOTE — Anesthesia Postprocedure Evaluation (Signed)
Anesthesia Post Note  Patient: [name]  Procedure(s) Performed: Procedure(s) (LRB): LEFT ANKLE ARTHROSCOPY, REMOVAL OF OSTEOCHONDRAL LESION WITH MICROFRACTURE (Left)  Patient location during evaluation: PACU Anesthesia Type: General and Regional Level of consciousness: awake and alert Pain management: pain level controlled Vital Signs Assessment: post-procedure vital signs reviewed and stable Respiratory status: spontaneous breathing, nonlabored ventilation and respiratory function stable Cardiovascular status: blood pressure returned to baseline and stable Postop Assessment: no signs of nausea or vomiting Anesthetic complications: no    Last Vitals:  Vitals:   10/12/15 1530 10/12/15 1545  BP: (!) 107/49 (!) 112/46  Pulse: 70 75  Resp: 14 11  Temp:      Last Pain:  Vitals:   10/12/15 1530  TempSrc:   PainSc: 0-No pain                 Nilda Simmer

## 2015-10-12 NOTE — Transfer of Care (Signed)
Immediate Anesthesia Transfer of Care Note  Patient: [name]  Procedure(s) Performed: Procedure(s): LEFT ANKLE ARTHROSCOPY, REMOVAL OF OSTEOCHONDRAL LESION WITH MICROFRACTURE (Left)  Patient Location: PACU  Anesthesia Type:GA combined with regional for post-op pain  Level of Consciousness: awake, alert , oriented and patient cooperative  Airway & Oxygen Therapy: Patient Spontanous Breathing and Patient connected to face mask oxygen  Post-op Assessment: Report given to RN and Post -op Vital signs reviewed and stable  Post vital signs: Reviewed and stable  Last Vitals:  Vitals:   10/12/15 1235 10/12/15 1436  BP:  (!) 125/51  Pulse: 77 98  Resp: 17 19  Temp:      Last Pain:  Vitals:   10/12/15 1112  TempSrc: Oral  PainSc: 5       Patients Stated Pain Goal: 3 ([id number])  Complications: No apparent anesthesia complications

## 2015-10-12 NOTE — Brief Op Note (Signed)
10/12/2015  2:32 PM  PATIENT:  [name]  70 y.o. female  PRE-OPERATIVE DIAGNOSIS:  Osteochondral lesion  POST-OPERATIVE DIAGNOSIS:  Osteochondral lesion  PROCEDURE:  Procedure(s): LEFT ANKLE ARTHROSCOPY, REMOVAL OF OSTEOCHONDRAL LESION WITH MICROFRACTURE (Left)  SURGEON:  Surgeon(s) and Role:    * Trula Slade, DPM - Primary  PHYSICIAN ASSISTANT:   ASSISTANTS: Dr. Daylene Katayama, DPM   ANESTHESIA:   Popliteal block by anesthesia pre-op and MAC  EBL:  Total I/O In: 400 [I.V.:400] Out: -   BLOOD ADMINISTERED:none  DRAINS: none   LOCAL MEDICATIONS USED:  NONE  SPECIMEN:  No Specimen  DISPOSITION OF SPECIMEN:  N/A  COUNTS:  YES  TOURNIQUET:  * Missing tourniquet times found for documented tourniquets in log:  [id number] *  DICTATION: .Viviann Spare Dictation  PLAN OF CARE: Discharge to home after PACU  PATIENT DISPOSITION:  PACU - hemodynamically stable.   Delay start of Pharmacological VTE agent (>24hrs) due to surgical blood loss or risk of bleeding: no

## 2015-10-12 NOTE — Anesthesia Procedure Notes (Signed)
Anesthesia Regional Block:  Popliteal block  Pre-Anesthetic Checklist: ,, timeout performed, Correct Patient, Correct Site, Correct Laterality, Correct Procedure, Correct Position, site marked, Risks and benefits discussed,  Surgical consent,  Pre-op evaluation,  At surgeon's request and post-op pain management  Laterality: Left  Prep: chloraprep       Needles:  Injection technique: Single-shot  Needle Type: Stimiplex     Needle Length: 9cm 9 cm Needle Gauge: 21 and 21 G    Additional Needles:  Procedures: ultrasound guided (picture in chart) Popliteal block Narrative:  Start time: 10/12/2015 12:23 PM End time: 10/12/2015 12:25 PM Injection made incrementally with aspirations every 5 mL.  Performed by: Personally  Anesthesiologist: Nilda Simmer

## 2015-10-13 ENCOUNTER — Encounter (HOSPITAL_BASED_OUTPATIENT_CLINIC_OR_DEPARTMENT_OTHER): Payer: Self-pay | Admitting: Podiatry

## 2015-10-14 ENCOUNTER — Encounter: Payer: Self-pay | Admitting: Podiatry

## 2015-10-14 NOTE — H&P (Signed)
  *  The complete H&P is in the paper chart

## 2015-10-14 NOTE — Progress Notes (Signed)
Subjective: 70 year old female had presented to the office on 7/21 and I evaluated her again prior to surgery. I did evaluate the patient with Dr. Paulla Dolly on the 7/21 visit and again preoperatively. She has had ongoing left ankle pain for quite some time. She is attended multiple conservative treatments any relief of symptoms at this time she had an MRI performed which did reveal a large osteochondral lesion of the left talus. I reviewed the MRI with Dr. Paulla Dolly and I evaluated the patient with him. Given her ongoing symptoms recommended ankle arthroscopy and/or repair of the osteochondral lesion. She states that she is having quite a bit of pain and she cannot walk without a couple of steps without having quite a bit of pain. This has been ongoing for several years and has been worsening.  Denies any systemic complaints such as fevers, chills, nausea, vomiting. No acute changes since last appointment, and no other complaints at this time.   Objective: AAO x3, NAD DP/PT pulses palpable bilaterally, CRT less than 3 seconds There is diffuse tenderness the left ankle mostly on the anterolateral aspect of the ankle gutter. There is pain with ankle joint range of motion others no crepitation. There is no area pinpoint bony tenderness or pain the vibratory sensation.  No edema, erythema, increase in warmth to bilateral lower extremities.  No open lesions or pre-ulcerative lesions.  No pain with calf compression, swelling, warmth, erythema  Assessment: Large osteochondral lesion, arthritis left ankle   Plan: -All treatment options discussed with the patient including all alternatives, risks, complications.  -MRI results were discussed and reviewed with Dr. Paulla Dolly as well as the patient. At this time discussed with her repair the osteochondral lesion. Again preoperatively discussed with her ankle arthroscopy and removal of the osteochondral lesion with microfracture and possible grafting. She understands this is  not a guarantee of resolution of symptoms and she wishes to go ahead and proceed with the surgery. -The incision placement as well as the postoperative course was discussed with the patient. I discussed risks of the surgery which include, but not limited to, infection, bleeding, pain, swelling, need for further surgery, delayed or nonhealing, painful or ugly scar, numbness or sensation changes, over/under correction, recurrence, transfer lesions, further deformity, hardware failure, DVT/PE, loss of toe/foot. Patient understands these risks and wishes to proceed with surgery. The surgical consent was reviewed with the patient all 3 pages were signed. No promises or guarantees were given to the outcome of the procedure. All questions were answered to the best of my ability. Before the surgery the patient was encouraged to call the office if there is any further questions. The surgery will be performed at [facility] on an outpatient basis on 10/12/15 -Patient encouraged to call the office with any questions, concerns, change in symptoms.   Celesta Gentile, DPM

## 2015-10-14 NOTE — Op Note (Signed)
Surgeon: [name], DPM Assistants: Daylene Katayama, DPM Pre-operative diagnosis: Left ankle osteochondral lesion Post-operative diagnosis: same Procedure: Left ankle arthroscopy with removal of OCL and microfracture, synovectomy Pathology: Specimen: none Pertinent Intra-op findings: Large OCL and significant inflammation and arthritic changes Anesthesia: MAC and popliteal block by anesthesia Hemostasis: PTT @ 325 mmHg EBL: minimal Materials: 4-0 nylon Injectables: none Complications: none  Indications for surgery: [name] presented to the office with concerns of left ankle pain which has been ongoing for several years. She states that she was unable to walk more than a couple status post the having significant pain to her ankle. She has an MRI performed which did reveal significant osteochondral lesion of the talus. Despite multiple conservative treatments she continued to have pain. She's follow up with Dr. Paulla Dolly and I saw the patient as well and recommended repair of osteochondral lesion. She wishes to go ahead and proceed with the surgery due to the significant pain that she is having. All alternatives, risks, complications were discussed with the patient detail. No promises or guarantees were given as to the outcome of the procedure and all questions were answered to best of my ability.   Procedure in detail: The patient was both verbally and visually identified by myself, the nursing staff, and anesthesia staff in the preoperative holding area. They were then transferred to the operating room and placed on the operative table in supine position. A well-padded thigh tourniquet was then placed. The left lower extremity and scrubbed prepped and draped in normal sterile fashion.   Ankle distractor was then placed manager pad all bony prominences. An 18-gauge needle was used to infiltrate the medial anterior gutter and once identified a total of 10 mL of lactated Ringer's was infiltrated.  Next a small stab incision was made medial to the extensor tendon with a 15 with scalpel. Blunt dissection was then carried down with a hemostat introduced into the ankle joint. After an trocar were then inserted. The camera was inserted and the ankle joint was identified. Next the lateral egress portal was then created with 15 blade scalpel. Care was taken to make the incision away from the nerve any tendinous structures. Incision made and 15 with scalpel to the epidermis in the dermis and blunt dissection was then carried down to the ankle joint capsule. At this time and evaluation of the joint revealed that there was significant synovitis as well as osteoarthritic changes present the ankle joint. A very large osteochondral lesion was present on the lateral aspect of the talus. Utilizing a shaver the synovitis was debrided. Next the osteochondral lesion given the size was then removed in total. Upon further evaluation of the joint at this point revealed that there was quite a bit of degenerative changes and almost the entire lateral portion of the talus appeared to be soft. Overlying the area of the defects within the talus this area was microfractured. Given the amount of bone loss as well as softening of the bone held off on any bio cartilage or grafts. Further evaluation and joint revealed there is adequate resection of the synovitis as well as the osteochondral lesion. Instruments were then removed in total. Each incision was then closed with 4-0 nylon. Adaptic was placed followed by dry sterile dressing. The tourniquet was released and there is found to be an immediate capillary refill time to all the toes. She was placed into a cam boot. She is unable coming anesthesia and found to tolerated the procedure well any complications.  There were transferred to PACU with vital signs stable and vascular status intact.  [name], DPM

## 2015-10-14 NOTE — Progress Notes (Signed)
DOS 09.20.2017 Ankle Joint Exploration with Saucerization and Probable Graft Left

## 2015-10-21 ENCOUNTER — Ambulatory Visit (INDEPENDENT_AMBULATORY_CARE_PROVIDER_SITE_OTHER): Payer: Commercial Managed Care - HMO

## 2015-10-21 ENCOUNTER — Ambulatory Visit (INDEPENDENT_AMBULATORY_CARE_PROVIDER_SITE_OTHER): Payer: Commercial Managed Care - HMO | Admitting: Podiatry

## 2015-10-21 VITALS — BP 141/79 | HR 75 | Temp 97.7°F

## 2015-10-21 DIAGNOSIS — M899 Disorder of bone, unspecified: Secondary | ICD-10-CM | POA: Insufficient documentation

## 2015-10-21 DIAGNOSIS — M949 Disorder of cartilage, unspecified: Secondary | ICD-10-CM

## 2015-10-21 DIAGNOSIS — M779 Enthesopathy, unspecified: Secondary | ICD-10-CM

## 2015-10-21 DIAGNOSIS — Z9889 Other specified postprocedural states: Secondary | ICD-10-CM

## 2015-10-21 MED ORDER — OXYCODONE-ACETAMINOPHEN 5-325 MG PO TABS: 1.0000 | ORAL_TABLET | Freq: Three times a day (TID) | ORAL | 0 refills | Status: DC | PRN

## 2015-10-21 NOTE — Progress Notes (Signed)
Subjective: [name] is a 70 y.o. is seen today in office s/p left ankle arthroscopy and OCD removal preformed on 10/12/15. She states that she has had minimal pain to the ankle. She's consent painful arch of her foot with the boot. She is tried Her foot as much as possible. She is continued antibiotic. She takes pain medication as needed. She's been taking Demerol which is also for Percocet states she has had a better at the moment her closely. She second Percocet before any issues. Denies any systemic complaints such as fevers, chills, nausea, vomiting. No calf pain, chest pain, shortness of breath.   Objective: General: No acute distress, AAOx3  DP/PT pulses palpable 2/4, CRT < 3 sec to all digits.  Protective sensation intact. Motor function intact.  Right ankle: Incision is well coapted without any evidence of dehiscence with sutures intact. There is no surrounding erythema, ascending cellulitis, fluctuance, crepitus, malodor, drainage/purulence. There is mild edema around the surgical site. There is mild pain along the surgical site. Ankle joint range of motion is intact and she is having range of motion today. Cefazolin there is some tenderness on the medial band plantar fascia are to this foot however there is no swelling or ecchymosis.  No other areas of tenderness to bilateral lower extremities.  No other open lesions or pre-ulcerative lesions.  No pain with calf compression, swelling, warmth, erythema.   Assessment and Plan:  Status post right ankle arthroscopy, doing well with no complications   -Treatment options discussed including all alternatives, risks, and complications -X-rays were obtained and reviewed with the patient. Evidence of acute fracture. There is evidence of osteochondral lesion lateral aspect of the talar down. -In about ointment and bandages applied. Continue cam boot. Next week she discharged to partial weightbearing as tolerated. She has a walker at home for  this. -Percocet as needed. -Ice/elevation. -Monitor for any clinical signs or symptoms of infection and DVT/PE and directed to call the office immediately should any occur or go to the ER. -Follow-up in 1 week or sooner if any problems arise. In the meantime, encouraged to call the office with any questions, concerns, change in symptoms.   Celesta Gentile, DPM

## 2015-10-28 ENCOUNTER — Ambulatory Visit (INDEPENDENT_AMBULATORY_CARE_PROVIDER_SITE_OTHER): Payer: Commercial Managed Care - HMO | Admitting: Podiatry

## 2015-10-28 ENCOUNTER — Encounter: Payer: Self-pay | Admitting: Podiatry

## 2015-10-28 DIAGNOSIS — Z9889 Other specified postprocedural states: Secondary | ICD-10-CM

## 2015-10-28 DIAGNOSIS — M899 Disorder of bone, unspecified: Secondary | ICD-10-CM

## 2015-10-28 DIAGNOSIS — M949 Disorder of cartilage, unspecified: Secondary | ICD-10-CM

## 2015-10-28 MED ORDER — OXYCODONE-ACETAMINOPHEN 5-325 MG PO TABS: 1.0000 | ORAL_TABLET | Freq: Three times a day (TID) | ORAL | 0 refills | Status: DC | PRN

## 2015-10-28 NOTE — Progress Notes (Signed)
Subjective: [name] is a 70 y.o. is seen today in office s/p left ankle arthroscopy and OCD removal preformed on 10/12/15. She presents today for suture removal. She states that she is still having some pain, and requesting pain medication. The pain that she has now she states is different than before surgery and describes it as a throbbing pain. She has been nonweightbearing with use of crutches in a CAM boot. Denies any systemic complaints such as fevers, chills, nausea, vomiting. No calf pain, chest pain, shortness of breath.   Objective: General: No acute distress, AAOx3  DP/PT pulses palpable 2/4, CRT < 3 sec to all digits.  Protective sensation intact. Motor function intact.  Right ankle: Incision is well coapted without any evidence of dehiscence with sutures intact. There is no surrounding erythema, ascending cellulitis, fluctuance, crepitus, malodor, drainage/purulence. There is minimal edema around the surgical site. There is minimal pain along the surgical site. Ankle joint range of motion is intact and she is having range of motion. She still has some pain to the heel but this is improving.  No other areas of tenderness to bilateral lower extremities.  No other open lesions or pre-ulcerative lesions.  No pain with calf compression, swelling, warmth, erythema.   Assessment and Plan:  Status post right ankle arthroscopy, doing well with no complications   -Treatment options discussed including all alternatives, risks, and complications -Sutures were removed today without complications. Antibiotic ointment was applied followed by a bandage. She can start to shower tomorrow and apply antibiotic ointment and a bandage afterwards. Over the next week she conservative transition to partial weightbearing as tolerated. Pain medicine was refilled today. Continue ice and elevation. -Monitor for any clinical signs or symptoms of infection and DVT/PE and directed to call the office immediately  should any occur or go to the ER. -Follow-up in 2 weeks or sooner if any problems arise. In the meantime, encouraged to call the office with any questions, concerns, change in symptoms.   Celesta Gentile, DPM

## 2015-11-04 ENCOUNTER — Telehealth: Payer: Self-pay | Admitting: Family Medicine

## 2015-11-04 NOTE — Telephone Encounter (Signed)
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  [place] [id number] for 6 visits from 11/14/15 - 05/15/16

## 2015-11-14 ENCOUNTER — Ambulatory Visit: Payer: Commercial Managed Care - HMO

## 2015-11-18 ENCOUNTER — Ambulatory Visit (INDEPENDENT_AMBULATORY_CARE_PROVIDER_SITE_OTHER): Payer: Commercial Managed Care - HMO | Admitting: Podiatry

## 2015-11-18 DIAGNOSIS — M899 Disorder of bone, unspecified: Secondary | ICD-10-CM

## 2015-11-18 DIAGNOSIS — M949 Disorder of cartilage, unspecified: Secondary | ICD-10-CM | POA: Diagnosis not present

## 2015-11-18 DIAGNOSIS — Z9889 Other specified postprocedural states: Secondary | ICD-10-CM

## 2015-11-18 MED ORDER — OXYCODONE-ACETAMINOPHEN 5-325 MG PO TABS: 1.0000 | ORAL_TABLET | Freq: Three times a day (TID) | ORAL | 0 refills | Status: DC | PRN

## 2015-11-20 NOTE — Progress Notes (Signed)
Subjective: [name] is a 70 y.o. is seen today in office s/p left ankle arthroscopy and OCD removal preformed on 10/12/15.She states that she is still having pain to her ankle. She also is having generalized foot pain which was going part of the surgery. She feels that she has not had much relief from the surgery in general. She said that she is still wearing the cam boot. She has not yet tried to wear a regular shoe. Denies any systemic complaints such as fevers, chills, nausea, vomiting. No calf pain, chest pain, shortness of breath.   Objective: General: No acute distress, AAOx3  DP/PT pulses palpable 2/4, CRT < 3 sec to all digits.  Protective sensation intact. Motor function intact.  Right ankle: Incision is well coapted without any evidence of dehiscence and a scar has formed. There is no surrounding erythema, ascending cellulitis, fluctuance, crepitus, malodor, drainage/purulence. There is minimal edema around the surgical site. Is no pain to the ankle joint on the anterior medial or anterior lateral angle that her first no pain or crepitation ankle joint range of motion. The majority tenderness appears to be along the peroneal tendon. She also gets pain on the medial band of the plantar fascial in the arch of the foot. There is no specific area pinpoint bony tenderness there is no pain vibratory sensation. There is no overlying edema, erythema, increase in warmth. No other open lesions or pre-ulcerative lesions.  No pain with calf compression, swelling, warmth, erythema.   Assessment and Plan:  Status post right ankle arthroscopy, doing well with no complications surgery however she discontinued have generalized foot and ankle pain.  -Treatment options discussed including all alternatives, risks, and complications -Ankle brace was dispensed that she can start to transition to regular shoe as tolerated. Discussed with her in detail possible custom ankle brace versus custom orthotics. I  gave her a pair of power steps to start with the see if this will help as well. If she does [place] orthotics we will probably make her custom one  however she's not having much relief discussed with her and ankle brace. Also discussed the next couple weeks starting physical therapy. I'll see her back as scheduled or sooner if needed. Call any questions or concerns in the meantime.  Celesta Gentile, DPM

## 2015-12-02 MED ORDER — OXYCODONE-ACETAMINOPHEN 5-325 MG PO TABS: 1.0000 | ORAL_TABLET | Freq: Three times a day (TID) | ORAL | 0 refills | Status: DC | PRN

## 2015-12-02 NOTE — Telephone Encounter (Signed)
OK to refill

## 2015-12-07 ENCOUNTER — Ambulatory Visit (INDEPENDENT_AMBULATORY_CARE_PROVIDER_SITE_OTHER): Payer: Commercial Managed Care - HMO | Admitting: Physician Assistant

## 2015-12-07 ENCOUNTER — Encounter: Payer: Self-pay | Admitting: Physician Assistant

## 2015-12-07 VITALS — BP 130/62 | HR 81 | Temp 98.2°F | Resp 16 | Wt 200.0 lb

## 2015-12-07 DIAGNOSIS — Z23 Encounter for immunization: Secondary | ICD-10-CM

## 2015-12-07 DIAGNOSIS — I10 Essential (primary) hypertension: Secondary | ICD-10-CM | POA: Diagnosis not present

## 2015-12-07 DIAGNOSIS — M79672 Pain in left foot: Secondary | ICD-10-CM | POA: Diagnosis not present

## 2015-12-07 DIAGNOSIS — F410 Panic disorder [episodic paroxysmal anxiety] without agoraphobia: Secondary | ICD-10-CM

## 2015-12-07 DIAGNOSIS — M79671 Pain in right foot: Secondary | ICD-10-CM

## 2015-12-07 DIAGNOSIS — E7800 Pure hypercholesterolemia, unspecified: Secondary | ICD-10-CM

## 2015-12-07 DIAGNOSIS — E119 Type 2 diabetes mellitus without complications: Secondary | ICD-10-CM

## 2015-12-07 DIAGNOSIS — N183 Chronic kidney disease, stage 3 unspecified: Secondary | ICD-10-CM

## 2015-12-07 DIAGNOSIS — G4700 Insomnia, unspecified: Secondary | ICD-10-CM

## 2015-12-07 LAB — HEMOGLOBIN A1C, FINGERSTICK: Hgb A1C (fingerstick): 6.1 % — ABNORMAL HIGH (ref <5.7)

## 2015-12-07 MED ORDER — ASPIRIN EC 81 MG PO TBEC: 81.0000 mg | DELAYED_RELEASE_TABLET | Freq: Every day | ORAL | 11 refills | Status: DC

## 2015-12-07 NOTE — Addendum Note (Signed)
Addended by: Vonna Kotyk A on: 12/07/2015 12:57 PM   Modules accepted: Orders

## 2015-12-07 NOTE — Progress Notes (Signed)
Patient ID: [name] MRN: [id number], DOB: 1945-06-04, 70 y.o. Date of Encounter: @DATE @  Chief Complaint:  Chief Complaint  Patient presents with  . Diabetes    follow up    HPI: 69 y.o. year old white female  presents for f/u OV.    03/28/2015 OV: She had office visit with me 02/28/15 at which time she reported that over the past couple of years she been having diarrhea off and on. Also having significant urgency and even occasional diarrhea and her underwear. Also having crampy abdominal pains and, bloating. At that visit I obtained extensive lab including H pylori, CBC, CMET, TSH, celiac panel, amylase, lipase. Labs were normal. At that time I had her stop her metformin-- the dosage was just 500 mg twice a day. At time had her replace this with Actos. Today she states that she feels so much better. Says that she feels great. Says that she is having no further diarrhea since being off of the metformin. No further abdominal cramping and no further bloating.  Also today she says that she has had a couple of episodes of feeling panic attack over the last 6 months.  States that both of these episodes occurred when she was in the presence of her son. Says that she does not usually see him. I did not start asking questions were go into any further detail of their history but she clearly states that the 2 times that she has felt panic have been prompted by family members. Reviewed my note from 01/12/15. Reviewed that she was already on Effexor and Wellbutrin. At that visit reported that she had been on meds since death of her daughter. See note 01/12/15 for further info.    09/05/2015: She reports that "her bronchitis has been acting up ". Says that she needed refill on her albuterol inhaler but we wouldn't refill it without her coming in. Says that she is using her Symbicort. Says that she has had these symptoms for about 1-1/2 weeks. She is coughing up phlegm. No fevers or  chills.  Says that a while back she ran out of Wellbutrin and she ended up just being off of it for a couple weeks and realized that she was feeling better so she has just stayed off of it and thinks that she is feeling better off of that and has been off of it for 2 months.  Says that she is supposed to be having foot surgery August 23--comments "was hoping I wasn't done a have to have another surgery ".  12/07/2015: She states that her mood regarding anxiety depression panic have been stable. Says that she has continued to feel good off of the Wellbutrin. This is controlled with taking the Effexor daily and using the Alprazolam PRN. She reports that she had surgery on her foot 6 weeks ago but says that the pain is no better. Says that he has given her something to use for some arch and also has a brace to wear on her foot. Her activity is very limited secondary to pain in her foot as well as knee pain hip pain etc.  She is taking diabetic medication (Actos 15mg ) as directed. No adverse effects. She checks her blood sugar twice a day. Says that her fasting morning readings are usually 121 or 116 or 118. She is taking blood pressure medication as directed with no adverse effect. She is taking Lipitor as directed. No myalgias or other adverse effects.  Past Medical History:  Diagnosis Date  . Allergy   . Anxiety   . Asthma   . Bursitis   . Cataract   . COPD (chronic obstructive pulmonary disease) ([place])   . Depression   . Diabetes mellitus   . Hiatal hernia   . Hypercholesteremia   . Hypertension   . Shortness of breath    chronic brochitis  . Ulcer ([place])      Home Meds: Outpatient Medications Prior to Visit  Medication Sig Dispense Refill  . albuterol (PROVENTIL HFA;VENTOLIN HFA) 108 (90 Base) MCG/ACT inhaler Inhale 2 puffs into the lungs every 6 (six) hours as needed for wheezing or shortness of breath. 1 Inhaler 4  . atorvastatin (LIPITOR) 80 MG tablet Take 1 tablet (80 mg  total) by mouth daily. 90 tablet 1  . budesonide-formoterol (SYMBICORT) 160-4.5 MCG/ACT inhaler Inhale 2 puffs into the lungs.    . cyclobenzaprine (FLEXERIL) 10 MG tablet TAKE 1 TABLET EVERY 8 HOURS AS NEEDED FOR SPASM. 90 tablet 0  . glucose blood test strip 1 each by Other route 2 (two) times daily. Use as instructed 200 each 3  . lisinopril (PRINIVIL,ZESTRIL) 40 MG tablet Take 1 tablet (40 mg total) by mouth daily. 90 tablet 1  . oxyCODONE-acetaminophen (ROXICET) 5-325 MG tablet Take 1 tablet by mouth every 8 (eight) hours as needed for severe pain. 30 tablet 0  . pioglitazone (ACTOS) 15 MG tablet Take 1 tablet (15 mg total) by mouth daily. 90 tablet 1  . promethazine (PHENERGAN) 12.5 MG tablet Take 1 tablet (12.5 mg total) by mouth every 8 (eight) hours as needed for nausea or vomiting. 30 tablet 0  . traZODone (DESYREL) 50 MG tablet Take 3 tablets (150 mg total) by mouth at bedtime as needed for sleep. 270 tablet 0  . venlafaxine XR (EFFEXOR-XR) 75 MG 24 hr capsule Take 2 capsules (150 mg total) by mouth daily. 180 capsule 0  . ALPRAZolam (XANAX) 0.25 MG tablet Take 1 tablet (0.25 mg total) by mouth 2 (two) times daily as needed for anxiety. (Patient not taking: Reported on 12/07/2015) 20 tablet 0  . cephALEXin (KEFLEX) 500 MG capsule Take 1 capsule (500 mg total) by mouth 2 (two) times daily. (Patient not taking: Reported on 12/07/2015) 30 capsule 0  . meperidine (DEMEROL) 50 MG tablet Take 1 tablet (50 mg total) by mouth every 4 (four) hours as needed. (Patient not taking: Reported on 12/07/2015) 30 tablet 0   No facility-administered medications prior to visit.     Allergies:  Allergies  Allergen Reactions  . Codeine Itching  . Flonase [Fluticasone Propionate]     Severe coughing  . Metformin And Related Diarrhea    Caused severe diarrhea and severe GI symptoms---even on low dose 500mg  BID    Social History   Social History  . Marital status: Single    Spouse name: N/A  .  Number of children: N/A  . Years of education: N/A   Occupational History  . Not on file.   Social History Main Topics  . Smoking status: Current Every Day Smoker    Packs/day: 0.50    Years: 52.00    Types: Cigarettes  . Smokeless tobacco: Never Used  . Alcohol use No  . Drug use:     Frequency: 3.0 times per week    Types: Marijuana     Comment: 1-3 times week  . Sexual activity: Not Currently    Birth control/ protection: Surgical   Other  Topics Concern  . Not on file   Social History Narrative  . No narrative on file    Family History  Problem Relation Age of Onset  . Breast cancer Daughter   . Heart disease Mother   . Hyperlipidemia Mother   . Hyperlipidemia Father   . Cancer Sister   . Birth defects Grandchild   . Anesthesia problems Neg Hx   . Hypotension Neg Hx   . Malignant hyperthermia Neg Hx   . Pseudochol deficiency Neg Hx      Review of Systems:  See HPI for pertinent ROS. All other ROS negative.    Physical Exam: Blood pressure 130/62, pulse 81, temperature 98.2 F (36.8 C), temperature source Oral, resp. rate 16, weight 200 lb (90.7 kg), SpO2 97 %., Body mass index is 34.33 kg/m. General: Obese WF. Appears in no acute distress. Neck: Supple. No thyromegaly. No lymphadenopathy. Lungs: Very slight wheeze scattered bilaterally.  Heart: RRR with S1 S2. No murmurs, rubs, or gallops. Abdomen: Soft, non-tender, non-distended with normoactive bowel sounds. No hepatomegaly. No rebound/guarding. No obvious abdominal masses. Musculoskeletal:  Strength and tone normal for age. Extremities/Skin: Warm and dry. No clubbing or cyanosis. No edema. No rashes or suspicious lesions. Neuro: Alert and oriented X 3. Moves all extremities spontaneously. Gait is normal. CNII-XII grossly in tact. Psych:  Responds to questions appropriately with a normal affect. Diabetic Foot Exam: No wounds or concerning areas.  She has been seeing podiatry a lot recently.       ASSESSMENT AND PLAN:  70 y.o. year old female with  Smoker 12/07/2015: Have discussed need for cessation multiple occasions 12/07/2015: Still not interested in cessation    Panic disorder 12/07/2015: This is stable/controlled. She is on Effexor daily. She uses the alprazolam as needed.   This has remained stable since she stopped the Wellbutrin. See history of present illness for detail.   Depression 12/07/2015: This is stable/controlled. She is on Effexor daily. She uses the alprazolam as needed.   This has remained stable since she stopped the Wellbutrin. See history of present illness for detail.   Diabetes mellitus without complication ([place]) 0000000: Check A1c today. Check microalbumin today.  CAN NOT USE METFORMIN---CAUSES SEVERE DIARRHEA AND GI SYMPTOMS  Currnenty on just Actos 15 mg daily.  She is on statin -- Lipitor.   She is on ACE inhibitor--- maximum 40 mg dose.    Hypercholesteremia At labs 12/2014 LDL was high and she then informed us that she had been off of her statin. At that time, she resumed statin.  Labs were repeated 03/28/15 on statin and her LDL was down to 63. 12/07/2015: LDL at goal. LFTs were normal at lab 8/17. Can wait to recheck these labs next visit.  Essential hypertension 12/07/2015:Blood pressure control/at goal. Continue current medications. Check lab to monitor this.  Obesity 12/07/2015: She cannot do any exercise and has very little activity secondary to foot pain knee pain. Sh has been educated regarding proper diet multiple times.  Chronic Kidney Disease 12/07/2015:  GFR 42 on lab 09/05/15  She is on maximum dose ACE inhibitor.  Her blood pressure and blood sugar at goal.    Schedule routine follow-up visit 3 months. Follow-up sooner if needed.   Signed, [address] [place], [place], [facility] 12/07/2015 11:36 AM

## 2015-12-08 LAB — MICROALBUMIN, URINE: Microalb, Ur: 1.1 mg/dL

## 2015-12-19 ENCOUNTER — Other Ambulatory Visit: Payer: Self-pay | Admitting: Podiatry

## 2015-12-19 ENCOUNTER — Ambulatory Visit (INDEPENDENT_AMBULATORY_CARE_PROVIDER_SITE_OTHER): Payer: Commercial Managed Care - HMO | Admitting: Podiatry

## 2015-12-19 ENCOUNTER — Encounter: Payer: Self-pay | Admitting: Podiatry

## 2015-12-19 ENCOUNTER — Ambulatory Visit (INDEPENDENT_AMBULATORY_CARE_PROVIDER_SITE_OTHER): Payer: Commercial Managed Care - HMO

## 2015-12-19 DIAGNOSIS — Z09 Encounter for follow-up examination after completed treatment for conditions other than malignant neoplasm: Secondary | ICD-10-CM

## 2015-12-19 DIAGNOSIS — M93272 Osteochondritis dissecans, left ankle and joints of left foot: Secondary | ICD-10-CM | POA: Diagnosis not present

## 2015-12-19 DIAGNOSIS — M949 Disorder of cartilage, unspecified: Secondary | ICD-10-CM

## 2015-12-19 DIAGNOSIS — M722 Plantar fascial fibromatosis: Secondary | ICD-10-CM

## 2015-12-19 DIAGNOSIS — M899 Disorder of bone, unspecified: Secondary | ICD-10-CM

## 2015-12-19 MED ORDER — OXYCODONE-ACETAMINOPHEN 10-325 MG PO TABS: 1.0000 | ORAL_TABLET | Freq: Four times a day (QID) | ORAL | 0 refills | Status: DC | PRN

## 2015-12-21 ENCOUNTER — Telehealth: Payer: Self-pay

## 2015-12-21 NOTE — Telephone Encounter (Signed)
Pt states she was around her friends dog the weekend they took the dog to the vet where they discovered the dog had kennel cough which the pt states is contagious.  I asked pt if she had any symptoms she stated not at this time but as a precaution would like a medication sent in for her.  Pls advise

## 2015-12-22 ENCOUNTER — Other Ambulatory Visit: Payer: Self-pay | Admitting: Physician Assistant

## 2015-12-22 NOTE — Telephone Encounter (Signed)
If she develops symptoms then she will need to schedule an office visit and come in for evaluation.

## 2015-12-22 NOTE — Progress Notes (Signed)
Subjective: [name] is a 70 y.o. is seen today in office s/p left ankle arthroscopy and OCD removal preformed on 10/12/15. She states that since wearing the power step she has been doing much better during the day with her steps in a regular shoe however when she takes her shoes off still gets quite a bit of pain to her foot. She points the heel which is the majority of symptoms. She has some pain of the ankle overall she doesn't have improved. She was hoping have a little bit more relief from the surgery but overall she is improving. Denies any systemic complaints such as fevers, chills, nausea, vomiting. No calf pain, chest pain, shortness of breath.   Objective: General: No acute distress, AAOx3  DP/PT pulses palpable 2/4, CRT < 3 sec to all digits.  Protective sensation intact. Motor function intact.  Right ankle: Incision is well coapted without any evidence of dehiscence and a scar has formed.There is no swelling erythema, ascending cellulitis. There is no drainage or pus reflex was a crepitus or any malodor. There is trace edema to the surgical site. There is no pain along the anteromedial or anterolateral aspect of the ankle joint. There is no pain with ankle joint range of motion. There is some discomfort on the course of the peroneal tendon just posterior inferior to the lateral malleolus. The majority symptoms appear to be to the plantar medial aspect of the heel along the insertion of the plantar fascia. There is no pain on the course of plantar fascial in the arch of the foot. There is no pain with lateral compression of the calcaneus. No overlying edema, erythema. No other open lesions or pre-ulcerative lesions.  No pain with calf compression, swelling, warmth, erythema.   Assessment and Plan:  Status post right ankle arthroscopy, doing well with no complications surgery plantar fasciitis;   -Treatment options discussed including all alternatives, risks, and  complications -Patient elects to proceed with steroid injection into the left heel. Under sterile skin preparation, a total of 2.5cc of kenalog 10, 0.5% Marcaine plain, and 2% lidocaine plain were infiltrated into the symptomatic area without complication. A band-aid was applied. Patient tolerated the injection well without complication. Post-injection care with discussed with the patient. Discussed with the patient to ice the area over the next couple of days to help prevent a steroid flare.  -Continue supportive shoe gear and power steps. Discussed with custom inserts. Also discussed with her changing shoes. -Will also start physical therapy. A prescription was provided today for physical therapy.  Celesta Gentile, DPM

## 2015-12-22 NOTE — Telephone Encounter (Signed)
Spoke with pt explained if she develops symptoms to come in for office visit

## 2016-01-02 ENCOUNTER — Telehealth: Payer: Self-pay

## 2016-01-02 NOTE — Telephone Encounter (Signed)
Pt states her allergies is acting up and is req  Singular a stronger dose be called in. I called  pt to get more info no answer lvm fo pt to call bck

## 2016-01-03 MED ORDER — MONTELUKAST SODIUM 10 MG PO TABS: ORAL_TABLET | ORAL | 5 refills | Status: DC

## 2016-01-03 NOTE — Telephone Encounter (Signed)
RX filled pt made aware

## 2016-01-03 NOTE — Telephone Encounter (Signed)
Singulair 10mg  1 po QD prn allergies. # 30 + 5

## 2016-01-03 NOTE — Telephone Encounter (Signed)
Pt called this morning and states she is sneezing really bad, has a cough,her eyes are burning, head stopped up and runny nose. Pt states she is sure it is her allergies and is req a strong dose of singular be called in.   Explained to pt it has been awhile since her last visit and an appt would need to be made to confirm it is her allergies Pt states she has this happen every year around this time. Pls advise

## 2016-01-04 ENCOUNTER — Other Ambulatory Visit: Payer: Self-pay | Admitting: Physician Assistant

## 2016-01-10 DIAGNOSIS — M25672 Stiffness of left ankle, not elsewhere classified: Secondary | ICD-10-CM | POA: Diagnosis not present

## 2016-01-10 DIAGNOSIS — M93272 Osteochondritis dissecans, left ankle and joints of left foot: Secondary | ICD-10-CM | POA: Diagnosis not present

## 2016-01-10 DIAGNOSIS — M62472 Contracture of muscle, left ankle and foot: Secondary | ICD-10-CM | POA: Diagnosis not present

## 2016-01-10 DIAGNOSIS — M25572 Pain in left ankle and joints of left foot: Secondary | ICD-10-CM | POA: Diagnosis not present

## 2016-01-13 ENCOUNTER — Telehealth: Payer: Self-pay | Admitting: Family Medicine

## 2016-01-13 NOTE — Telephone Encounter (Signed)
Dr Vella Redhead office caling for auth for visit next week for diabetic eye exam.  Approval rec'd  # [id number] for 6 visits from 01/17/16 - 07/16/16.

## 2016-02-15 ENCOUNTER — Other Ambulatory Visit: Payer: Self-pay | Admitting: Family Medicine

## 2016-02-15 NOTE — Telephone Encounter (Signed)
Medication refill for one time only.  Patient needs to be seen.  

## 2016-03-22 ENCOUNTER — Other Ambulatory Visit: Payer: Self-pay | Admitting: Orthopaedic Surgery

## 2016-03-22 ENCOUNTER — Other Ambulatory Visit: Payer: Self-pay | Admitting: Physician Assistant

## 2016-03-22 ENCOUNTER — Other Ambulatory Visit: Payer: Self-pay | Admitting: Family Medicine

## 2016-03-22 NOTE — Telephone Encounter (Signed)
Denied.  Not appropriate for long term use.

## 2016-03-23 ENCOUNTER — Ambulatory Visit (INDEPENDENT_AMBULATORY_CARE_PROVIDER_SITE_OTHER): Payer: Medicare HMO | Admitting: Family Medicine

## 2016-03-23 ENCOUNTER — Encounter: Payer: Self-pay | Admitting: Family Medicine

## 2016-03-23 VITALS — BP 132/68 | HR 72 | Temp 98.5°F | Resp 18 | Ht 66.0 in | Wt 200.0 lb

## 2016-03-23 DIAGNOSIS — F17200 Nicotine dependence, unspecified, uncomplicated: Secondary | ICD-10-CM | POA: Diagnosis not present

## 2016-03-23 DIAGNOSIS — J0100 Acute maxillary sinusitis, unspecified: Secondary | ICD-10-CM | POA: Diagnosis not present

## 2016-03-23 DIAGNOSIS — J439 Emphysema, unspecified: Secondary | ICD-10-CM | POA: Diagnosis not present

## 2016-03-23 MED ORDER — AMOXICILLIN 875 MG PO TABS: 875.0000 mg | ORAL_TABLET | Freq: Two times a day (BID) | ORAL | 0 refills | Status: DC

## 2016-03-23 MED ORDER — METHYLPREDNISOLONE ACETATE 40 MG/ML IJ SUSP
40.0000 mg | Freq: Once | INTRAMUSCULAR | Status: AC
  Administered 2016-03-23: 40 mg via INTRAMUSCULAR

## 2016-03-23 NOTE — Assessment & Plan Note (Addendum)
Given samples of symbicort Continue albuterol Needs tobacco cessation No sign of exacerbation today, I think she is having more spells because she does not have her maintenance inhaler. Her oxygenation is normal today.

## 2016-03-23 NOTE — Patient Instructions (Signed)
Use symbicort Antibiotics Use allergy pill in the morning F/U as needed

## 2016-03-23 NOTE — Progress Notes (Signed)
   Subjective:    Patient ID: [name], female    DOB: 06/03/1945, 71 y.o.   MRN: [id number]  Patient presents for Seasonal Allergies (x2 weeks- watery eyes, sinus pressure, nonproductive cough)   71 year old female with history of asthma/COPD she is also a smoker. She has been prescribed Symbicort in the past but is unable to afford She is currently on albuterol as well as Singulair.  For the past 2 weeks she has had nasal drainage sinus pressure pain in her cheek area as well as the frontal area. She's not had any fever. She's also felt some tightness on and off in her chest has had occasional wheeze. She is using the albuterol but is not helping as much.    Review Of Systems:  GEN- denies fatigue, fever, weight loss,weakness, recent illness HEENT- denies eye drainage, change in vision, +nasal discharge, CVS- denies chest pain, palpitations RESP- denies SOB,+ cough, +wheeze ABD- denies N/V, change in stools, abd pain GU- denies dysuria, hematuria, dribbling, incontinence MSK- denies joint pain, muscle aches, injury Neuro- denies headache, dizziness, syncope, seizure activity       Objective:    BP 132/68   Pulse 72   Temp 98.5 F (36.9 C) (Oral)   Resp 18   Ht 5\' 6"  (1.676 m)   Wt 200 lb (90.7 kg)   SpO2 97%   BMI 32.28 kg/m  GEN- NAD, alert and oriented x3 HEENT- PERRL, EOMI, non injected sclera, pink conjunctiva, MMM, oropharynx mild injection, TM clear bilat no effusion,  + maxillary/frontal  sinus tenderness, inflammed turbinates,  Nasal drainage  Neck- Supple, no LAD CVS- RRR, no murmur RESP-CTAB, normal WOB EXT- No edema Pulses- Radial 2+         Assessment & Plan:      Problem List Items Addressed This Visit    Tobacco use disorder    counseled on cessation      COPD (chronic obstructive pulmonary disease) ([facility])    Given samples of symbicort Continue albuterol Needs tobacco cessation      Relevant Medications   methylPREDNISolone  acetate (DEPO-MEDROL) injection 40 mg (Start on 03/23/2016 10:45 AM)    Other Visit Diagnoses    Acute non-recurrent maxillary sinusitis    -  Primary   treat with amoxicillin, nasal saline, she does tolerate steroids, given injection in office Depo Medrol   Relevant Medications   methylPREDNISolone acetate (DEPO-MEDROL) injection 40 mg (Start on 03/23/2016 10:45 AM)   amoxicillin (AMOXIL) 875 MG tablet      Note: This dictation was prepared with Dragon dictation along with smaller phrase technology. Any transcriptional errors that result from this process are unintentional.

## 2016-03-23 NOTE — Assessment & Plan Note (Signed)
counseled on cessation 

## 2016-05-04 ENCOUNTER — Other Ambulatory Visit: Payer: Self-pay | Admitting: Family Medicine

## 2016-05-09 ENCOUNTER — Other Ambulatory Visit: Payer: Self-pay | Admitting: Physician Assistant

## 2016-06-22 ENCOUNTER — Other Ambulatory Visit: Payer: Self-pay | Admitting: Family Medicine

## 2016-07-16 ENCOUNTER — Ambulatory Visit (INDEPENDENT_AMBULATORY_CARE_PROVIDER_SITE_OTHER): Payer: Medicare HMO | Admitting: Family Medicine

## 2016-07-16 ENCOUNTER — Encounter: Payer: Self-pay | Admitting: Family Medicine

## 2016-07-16 VITALS — BP 132/74 | HR 78 | Temp 98.1°F | Resp 14 | Ht 66.0 in | Wt 206.0 lb

## 2016-07-16 DIAGNOSIS — K219 Gastro-esophageal reflux disease without esophagitis: Secondary | ICD-10-CM

## 2016-07-16 DIAGNOSIS — R748 Abnormal levels of other serum enzymes: Secondary | ICD-10-CM | POA: Diagnosis not present

## 2016-07-16 DIAGNOSIS — R197 Diarrhea, unspecified: Secondary | ICD-10-CM | POA: Diagnosis not present

## 2016-07-16 DIAGNOSIS — R1011 Right upper quadrant pain: Secondary | ICD-10-CM

## 2016-07-16 LAB — CBC WITH DIFFERENTIAL/PLATELET
BASOS ABS: 90 {cells}/uL (ref 0–200)
Basophils Relative: 1 %
EOS PCT: 2 %
Eosinophils Absolute: 180 cells/uL (ref 15–500)
HCT: 35.2 % (ref 35.0–45.0)
Hemoglobin: 11.3 g/dL — ABNORMAL LOW (ref 12.0–15.0)
LYMPHS ABS: 2340 {cells}/uL (ref 850–3900)
Lymphocytes Relative: 26 %
MCH: 28.9 pg (ref 27.0–33.0)
MCHC: 32.1 g/dL (ref 32.0–36.0)
MCV: 90 fL (ref 80.0–100.0)
MPV: 10.4 fL (ref 7.5–12.5)
Monocytes Absolute: 540 cells/uL (ref 200–950)
Monocytes Relative: 6 %
NEUTROS PCT: 65 %
Neutro Abs: 5850 cells/uL (ref 1500–7800)
Platelets: 294 10*3/uL (ref 140–400)
RBC: 3.91 MIL/uL (ref 3.80–5.10)
RDW: 14.4 % (ref 11.0–15.0)
WBC: 9 10*3/uL (ref 3.8–10.8)

## 2016-07-16 LAB — COMPREHENSIVE METABOLIC PANEL
ALK PHOS: 109 U/L (ref 33–130)
ALT: 15 U/L (ref 6–29)
AST: 17 U/L (ref 10–35)
Albumin: 4.1 g/dL (ref 3.6–5.1)
BILIRUBIN TOTAL: 0.3 mg/dL (ref 0.2–1.2)
BUN: 20 mg/dL (ref 7–25)
CO2: 23 mmol/L (ref 20–31)
Calcium: 9.7 mg/dL (ref 8.6–10.4)
Chloride: 103 mmol/L (ref 98–110)
Creat: 1.14 mg/dL — ABNORMAL HIGH (ref 0.50–0.99)
GLUCOSE: 99 mg/dL (ref 70–99)
POTASSIUM: 4.7 mmol/L (ref 3.5–5.3)
SODIUM: 138 mmol/L (ref 135–146)
Total Protein: 7.1 g/dL (ref 6.1–8.1)

## 2016-07-16 MED ORDER — OMEPRAZOLE 40 MG PO CPDR: 40.0000 mg | DELAYED_RELEASE_CAPSULE | Freq: Every day | ORAL | 3 refills | Status: DC

## 2016-07-16 MED ORDER — BUDESONIDE-FORMOTEROL FUMARATE 160-4.5 MCG/ACT IN AERO: 2.0000 | INHALATION_SPRAY | Freq: Two times a day (BID) | RESPIRATORY_TRACT | 3 refills | Status: DC

## 2016-07-16 NOTE — Progress Notes (Signed)
   Subjective:    Patient ID: [name], female    DOB: [birth year], 71 y.o.   MRN: [id number]  Patient presents for RUQ Pain (x weeks- reports sharp pain in RUQ, stomach blaoting, loose stools 3-4x daily, nausea, indigestion)   Past few months, has had RUQ pain, has nausea and belching after meals, she has reflux symptoms, diarrhea 3-4x a day, no matter what she eats. She still gets looser bowels whether she eats fried foods, fatty foods or "healthy foods" . No blood in stools. Overdue for colonoscopy  She quite smoking for 1 month and went back to smoking  no major abdominal surgeries     Review Of Systems:  GEN- denies fatigue, fever, weight loss,weakness, recent illness HEENT- denies eye drainage, change in vision, nasal discharge, CVS- denies chest pain, palpitations RESP- denies SOB, cough, wheeze ABD- denies N/V, +change in stools, +abd pain GU- denies dysuria, hematuria, dribbling, incontinence MSK- denies joint pain, muscle aches, injury Neuro- denies headache, dizziness, syncope, seizure activity       Objective:    BP 132/74   Pulse 78   Temp 98.1 F (36.7 C) (Oral)   Resp 14   Ht 5\' 6"  (1.676 m)   Wt 206 lb (93.4 kg)   SpO2 97%   BMI 33.25 kg/m  GEN- NAD, alert and oriented x3 HEENT- PERRL, EOMI, non injected sclera, pink conjunctiva, MMM, oropharynx clear CVS- RRR, no murmur RESP-CTAB ABD-NABS,soft,TTP RUQ, no mass palpated  Pulses- Radial 2+        Assessment & Plan:    Given samples of symbicort 80-42.5 we did not have her typical dose    Problem List Items Addressed This Visit    None    Visit Diagnoses    RUQ abdominal pain    -  Primary   Concern for gallbladder etiology, next would be gastritis/ulcer. Obtain RUQ [place], obtain labs, given prilosec to cover reflux. She is also due for colonoscopy   Relevant Orders   Comprehensive metabolic panel   CBC with Differential/Platelet   Lipase   US Abdomen Limited RUQ   Diarrhea, unspecified  type       Relevant Orders   Comprehensive metabolic panel   CBC with Differential/Platelet   Gastroesophageal reflux disease without esophagitis       Relevant Medications   omeprazole (PRILOSEC) 40 MG capsule      Note: This dictation was prepared with Dragon dictation along with smaller phrase technology. Any transcriptional errors that result from this process are unintentional.

## 2016-07-16 NOTE — Patient Instructions (Signed)
Ultrasound to be done       

## 2016-07-17 LAB — LIPASE: Lipase: 77 U/L — ABNORMAL HIGH (ref 7–60)

## 2016-07-17 NOTE — Addendum Note (Signed)
Addended by: Vic Blackbird F on: 07/17/2016 12:29 PM   Modules accepted: Orders

## 2016-07-20 ENCOUNTER — Ambulatory Visit (HOSPITAL_COMMUNITY)
Admission: RE | Admit: 2016-07-20 | Discharge: 2016-07-20 | Disposition: A | Discharge status: Home / Self Care | Payer: Medicare HMO | Source: Ambulatory Visit | Attending: Family Medicine | Admitting: Family Medicine

## 2016-07-20 DIAGNOSIS — R748 Abnormal levels of other serum enzymes: Secondary | ICD-10-CM | POA: Diagnosis not present

## 2016-07-20 DIAGNOSIS — R197 Diarrhea, unspecified: Secondary | ICD-10-CM

## 2016-07-20 DIAGNOSIS — R1011 Right upper quadrant pain: Secondary | ICD-10-CM

## 2016-07-24 ENCOUNTER — Other Ambulatory Visit: Payer: Self-pay | Admitting: *Deleted

## 2016-07-24 DIAGNOSIS — R1011 Right upper quadrant pain: Secondary | ICD-10-CM

## 2016-07-30 ENCOUNTER — Encounter: Payer: Self-pay | Admitting: Internal Medicine

## 2016-08-14 ENCOUNTER — Other Ambulatory Visit: Payer: Self-pay | Admitting: Physician Assistant

## 2016-08-14 ENCOUNTER — Other Ambulatory Visit: Payer: Self-pay | Admitting: Family Medicine

## 2016-08-24 ENCOUNTER — Other Ambulatory Visit: Payer: Self-pay | Admitting: Physician Assistant

## 2016-08-24 DIAGNOSIS — J209 Acute bronchitis, unspecified: Secondary | ICD-10-CM

## 2016-08-24 NOTE — Telephone Encounter (Signed)
Refill appropriate 

## 2016-09-21 ENCOUNTER — Ambulatory Visit (INDEPENDENT_AMBULATORY_CARE_PROVIDER_SITE_OTHER): Payer: Medicare HMO | Admitting: Gastroenterology

## 2016-09-21 ENCOUNTER — Other Ambulatory Visit: Payer: Self-pay

## 2016-09-21 ENCOUNTER — Encounter: Payer: Self-pay | Admitting: Gastroenterology

## 2016-09-21 DIAGNOSIS — R1013 Epigastric pain: Secondary | ICD-10-CM | POA: Insufficient documentation

## 2016-09-21 DIAGNOSIS — Z86010 Personal history of colonic polyps: Secondary | ICD-10-CM

## 2016-09-21 DIAGNOSIS — K219 Gastro-esophageal reflux disease without esophagitis: Secondary | ICD-10-CM | POA: Diagnosis not present

## 2016-09-21 DIAGNOSIS — R1011 Right upper quadrant pain: Secondary | ICD-10-CM | POA: Diagnosis not present

## 2016-09-21 MED ORDER — PEG 3350-KCL-NA BICARB-NACL 420 G PO SOLR: 4000.0000 mL | ORAL | 0 refills | Status: DC

## 2016-09-21 NOTE — Progress Notes (Signed)
Primary Care Physician:  Susy Frizzle, MD  Primary Gastroenterologist:  Garfield Cornea, MD   Chief Complaint  Patient presents with  . Abdominal Pain  . Bloated    HPI:  [name] is a 71 y.o. female here at the request of Dr. Buelah Manis for further evaluation of ruq pain/epigastric pain and need for a colonoscopy (h/o colon adenomas). Last colonoscopy by Dr. Henrene Pastor 2012, tubular adenoma, random colon bx negative, internal hemorrhoids, advised to come back 2017. Last EGD 2003, gastritis. H/o H.pylori + serologies prior to that EGD, path unavailable.   Patient complains of several month history of right upper quadrant pain/epigastric pain. Right upper quadrant pain can occur with or without food. Described almost like a muscle spasm. Sometimes if she stretches out it may help. Epigastric discomfort more food related. When she each she feels bloated and full for the rest the day. Eat's first at 11am but full for rest of day. Heartburn real bad. Takes baking soda. Tried Copy[employer], advertising. Omeprazole didn't help. No dysphagia. Feels pressure in chest like needs to belch but takes forever to get it up. Sometimes regurgitates with this. BM fine. No melena, brbpr.   No new medications. No ASA powders. Rare Aleve use. H/O ulcer in the 1990s, had EGD.   PCP workup for abdominal pain has included normal LFTs, minimally elevated lipase in the 70 range, white blood cell count normal, hemoglobin 11.3 slightly low but hematocrit normal at 35.2. Abdominal ultrasound unremarkable. Pancreas not really seen.  Current Outpatient Prescriptions  Medication Sig Dispense Refill  . aspirin EC 81 MG tablet Take 1 tablet (81 mg total) by mouth daily. 30 tablet 11  . atorvastatin (LIPITOR) 80 MG tablet TAKE ONE TABLET BY MOUTH ONCE DAILY. 90 tablet 0  . budesonide-formoterol (SYMBICORT) 160-4.5 MCG/ACT inhaler Inhale 2 puffs into the lungs 2 (two) times daily. 1 Inhaler 3  . glucose blood test strip 1 each by Other  route 2 (two) times daily. Use as instructed 200 each 3  . lisinopril (PRINIVIL,ZESTRIL) 40 MG tablet TAKE (1) TABLET BY MOUTH ONCE DAILY. 90 tablet 0  . montelukast (SINGULAIR) 10 MG tablet Take 1 tablet by mouth daily as needed for allergies. 30 tablet 5  . pioglitazone (ACTOS) 15 MG tablet TAKE ONE TABLET BY MOUTH ONCE DAILY. 90 tablet 0  . promethazine (PHENERGAN) 12.5 MG tablet Take 1 tablet (12.5 mg total) by mouth every 8 (eight) hours as needed for nausea or vomiting. 30 tablet 0  . traZODone (DESYREL) 50 MG tablet TAKE 3 TABLETS BY MOUTH AT BEDTIME AS NEEDED FOR SLEEP. 270 tablet 0  . venlafaxine XR (EFFEXOR-XR) 75 MG 24 hr capsule TAKE 2 CAPSULES BY MOUTH DAILY. 180 capsule 0  . VENTOLIN HFA 108 (90 Base) MCG/ACT inhaler INHALE 2 PUFFS EVERY 4 - 6 HOURS AS NEEDED FOR SHORTNESS OF BREATH ORWHEEZING. 18 g 0   No current facility-administered medications for this visit.     Allergies as of 09/21/2016 - Review Complete 09/21/2016  Allergen Reaction Noted  . Codeine Itching   . Flonase [fluticasone propionate]  12/06/2010  . Metformin and related Diarrhea 03/28/2015    Past Medical History:  Diagnosis Date  . Allergy   . Anxiety   . Asthma   . Bursitis   . Cataract   . COPD (chronic obstructive pulmonary disease) ([place])   . Depression   . Diabetes mellitus   . Hiatal hernia   . Hypercholesteremia   . Hypertension   .  Shortness of breath    chronic brochitis  . Ulcer     Past Surgical History:  Procedure Laterality Date  . ABDOMINAL HYSTERECTOMY    . ANKLE ARTHROSCOPY WITH DRILLING/MICROFRACTURE Left 10/12/2015   Procedure: LEFT ANKLE ARTHROSCOPY, REMOVAL OF OSTEOCHONDRAL LESION WITH MICROFRACTURE;  Surgeon: Trula Slade, DPM;  Location: [facility];  Service: Podiatry;  Laterality: Left;  . CATARACT EXTRACTION W/PHACO  10/16/2010   Procedure: CATARACT EXTRACTION PHACO AND INTRAOCULAR LENS PLACEMENT (IOC);  Surgeon: Tonny Branch;  Location: [facility];   Service: Ophthalmology;  Laterality: Left;  CDE: 6.99  . CATARACT EXTRACTION W/PHACO  12/18/2010   Procedure: CATARACT EXTRACTION PHACO AND INTRAOCULAR LENS PLACEMENT (IOC);  Surgeon: Tonny Branch;  Location: [facility];  Service: Ophthalmology;  Laterality: Right;  CDE 12.50  . COLONOSCOPY     about 5 years ago/[facility]  . EYE SURGERY    . fracture lower back    . left elbow tendon repair    . NEUROPLASTY / TRANSPOSITION MEDIAN NERVE AT CARPAL TUNNEL BILATERAL    . plantar fasc bilateral    . right rotator cuff    . rt foot reconstruction    . trigger thumb     Left    Family History  Problem Relation Age of Onset  . Breast cancer Daughter   . Heart disease Mother   . Hyperlipidemia Mother   . Hyperlipidemia Father   . Cancer Sister   . Birth defects Grandchild   . Anesthesia problems Neg Hx   . Hypotension Neg Hx   . Malignant hyperthermia Neg Hx   . Pseudochol deficiency Neg Hx   . Colon cancer Neg Hx     Social History   Social History  . Marital status: Single    Spouse name: N/A  . Number of children: N/A  . Years of education: N/A   Occupational History  . Not on file.   Social History Main Topics  . Smoking status: Current Every Day Smoker    Packs/day: 0.50    Years: 52.00    Types: Cigarettes  . Smokeless tobacco: Never Used  . Alcohol use No  . Drug use: Yes    Frequency: 3.0 times per week    Types: Marijuana     Comment: 1-3 times week  . Sexual activity: Not Currently    Birth control/ protection: Surgical   Other Topics Concern  . Not on file   Social History Narrative  . No narrative on file      ROS:  General: Negative for anorexia, weight loss, fever, chills, fatigue, weakness. Eyes: Negative for vision changes.  ENT: Negative for hoarseness, difficulty swallowing , nasal congestion. CV: Negative for chest pain, angina, palpitations, dyspnea on exertion, peripheral edema.  Respiratory: Negative for dyspnea at rest, dyspnea on exertion,  cough, sputum, wheezing.  GI: See history of present illness. GU:  Negative for dysuria, hematuria, urinary incontinence, urinary frequency, nocturnal urination.  MS: Negative for joint pain, low back pain.  Derm: Negative for rash or itching.  Neuro: Negative for weakness, abnormal sensation, seizure, frequent headaches, memory loss, confusion.  Psych: Negative for anxiety, depression, suicidal ideation, hallucinations.  Endo: Negative for unusual weight change.  Heme: Negative for bruising or bleeding. Allergy: Negative for rash or hives.    Physical Examination:  BP 111/70   Pulse 87   Temp 97.6 F (36.4 C) (Oral)   Ht 5\' 5"  (1.651 m)   Wt 207 lb 12.8 oz (  94.3 kg)   BMI 34.58 kg/m    General: Well-nourished, well-developed in no acute distress.  Head: Normocephalic, atraumatic.   Eyes: Conjunctiva pink, no icterus. Mouth: Oropharyngeal mucosa moist and pink , no lesions erythema or exudate. Neck: Supple without thyromegaly, masses, or lymphadenopathy.  Lungs: Clear to auscultation bilaterally.  Heart: Regular rate and rhythm, no murmurs rubs or gallops.  Abdomen: Bowel sounds are normal, moderate right upper quadrant/epigastric , nondistended, no hepatosplenomegaly or masses, no abdominal bruits or    hernia , no rebound or guarding.   Rectal: Deferred Extremities: No lower extremity edema. No clubbing or deformities.  Neuro: Alert and oriented x 4 , grossly normal neurologically.  Skin: Warm and dry, no rash or jaundice.   Psych: Alert and cooperative, normal mood and affect.  Labs: Lab Results  Component Value Date   LIPASE 77 (H) 07/16/2016   Lab Results  Component Value Date   CREATININE 1.14 (H) 07/16/2016   BUN 20 07/16/2016   NA 138 07/16/2016   K 4.7 07/16/2016   CL 103 07/16/2016   CO2 23 07/16/2016   Lab Results  Component Value Date   WBC 9.0 07/16/2016   HGB 11.3 (L) 07/16/2016   HCT 35.2 07/16/2016   MCV 90.0 07/16/2016   PLT 294 07/16/2016    No results found for: IRON, TIBC, FERRITIN  Lab Results  Component Value Date   ALT 15 07/16/2016   AST 17 07/16/2016   ALKPHOS 109 07/16/2016   BILITOT 0.3 07/16/2016    Imaging Studies:  CLINICAL DATA:  Right upper quadrant pain with elevated LFTs  EXAM: ABDOMEN ULTRASOUND COMPLETE  COMPARISON:  04/16/2003  FINDINGS: Gallbladder: No gallstones or wall thickening visualized. No sonographic Murphy sign noted by sonographer.  Common bile duct: Diameter: 4.8 mm  Liver: No focal lesion identified. Within normal limits in parenchymal echogenicity. Patent portal vein with normal hepatopetal flow.  IVC: No abnormality visualized.  Pancreas: Limited but Visualized portion unremarkable.  Spleen: Size and appearance within normal limits.  Right Kidney: Length: 10.5 cm. Echogenicity within normal limits. No mass or hydronephrosis visualized.  Left Kidney: Length: 12.7 cm. Echogenicity within normal limits. No mass or hydronephrosis visualized.  Abdominal aorta: No aneurysm visualized.  Other findings: No abdominal free fluid or ascites  IMPRESSION: No acute finding by abdominal ultrasound.   Electronically Signed   By: Jerilynn Mages.  Shick M.D.   On: 07/20/2016 10:36

## 2016-09-21 NOTE — Patient Instructions (Addendum)
1. Colonoscopy and upper endoscopy with Dr. Gala Romney as scheduled. See separate instructions.

## 2016-09-21 NOTE — Progress Notes (Signed)
cc'd to pcp 

## 2016-09-21 NOTE — Patient Instructions (Signed)
I called patient to move her TCS from 10/15/16 to 10/11/16 but she could not do that date so now she is set up for TCS on 10/24/16. New instructions are in the mail and she is aware.

## 2016-09-21 NOTE — Assessment & Plan Note (Signed)
Due for surveillance colonoscopy at this time. Plan for deep sedation in the OR.  I have discussed the risks, alternatives, benefits with regards to but not limited to the risk of reaction to medication, bleeding, infection, perforation and the patient is agreeable to proceed. Written consent to be obtained.

## 2016-09-21 NOTE — Assessment & Plan Note (Signed)
Several month history of epigastric pain associated with postprandial bloating/fullness. Also with right upper quadrant pain which is less related to meals. Described as a muscle spasm. Abdominal ultrasound unremarkable. Lipase minimally elevated and pancreas not well seen on ultrasound.  At this time would recommend upper endoscopy for further evaluation. If EGD is unremarkable, she would require further evaluation possibly including CT of the abdomen to evaluate abnormal lipase and unexplained symptoms.   I have discussed the risks, alternatives, benefits with regards to but not limited to the risk of reaction to medication, bleeding, infection, perforation and the patient is agreeable to proceed. Written consent to be obtained.

## 2016-09-24 ENCOUNTER — Other Ambulatory Visit: Payer: Self-pay | Admitting: Family Medicine

## 2016-09-24 ENCOUNTER — Other Ambulatory Visit: Payer: Self-pay | Admitting: Physician Assistant

## 2016-09-24 DIAGNOSIS — J209 Acute bronchitis, unspecified: Secondary | ICD-10-CM

## 2016-10-04 ENCOUNTER — Other Ambulatory Visit (HOSPITAL_COMMUNITY): Payer: Medicare HMO

## 2016-10-11 DIAGNOSIS — H5210 Myopia, unspecified eye: Secondary | ICD-10-CM | POA: Diagnosis not present

## 2016-10-17 NOTE — Patient Instructions (Signed)
[name]  10/17/2016     @PREFPERIOPPHARMACY @   Your procedure is scheduled on  10/24/2016 .  Report to [facility] at  1145  A.M.  Call this number if you have problems the morning of surgery:  [phone]   Remember:  Do not eat food or drink liquids after midnight.  Take these medicines the morning of surgery with A SIP OF WATER  Phenergan, effexor. Use your inhalers before you come and bring your rescue inhaler with you if you have one.   Do not wear jewelry, make-up or nail polish.  Do not wear lotions, powders, or perfumes, or deoderant.  Do not shave 48 hours prior to surgery.  Men may shave face and neck.  Do not bring valuables to the hospital.  [facility] is not responsible for any belongings or valuables.  Contacts, dentures or bridgework may not be worn into surgery.  Leave your suitcase in the car.  After surgery it may be brought to your room.  For patients admitted to the hospital, discharge time will be determined by your treatment team.  Patients discharged the day of surgery will not be allowed to drive home.   Name and phone number of your driver:   family Special instructions:  Follow the diet and prep instructions given to you by Dr Roseanne Kaufman office.  Please read over the following fact sheets that you were given. Anesthesia Post-op Instructions and Care and Recovery After Surgery       Esophagogastroduodenoscopy Esophagogastroduodenoscopy (EGD) is a procedure to examine the lining of the esophagus, stomach, and first part of the small intestine (duodenum). This procedure is done to check for problems such as inflammation, bleeding, ulcers, or growths. During this procedure, a long, flexible, lighted tube with a camera attached (endoscope) is inserted down the throat. Tell a health care provider about:  Any allergies you have.  All medicines you are taking, including vitamins, herbs, eye drops, creams, and  over-the-counter medicines.  Any problems you or family members have had with anesthetic medicines.  Any blood disorders you have.  Any surgeries you have had.  Any medical conditions you have.  Whether you are pregnant or may be pregnant. What are the risks? Generally, this is a safe procedure. However, problems may occur, including:  Infection.  Bleeding.  A tear (perforation) in the esophagus, stomach, or duodenum.  Trouble breathing.  Excessive sweating.  Spasms of the larynx.  A slowed heartbeat.  Low blood pressure.  What happens before the procedure?  Follow instructions from your health care provider about eating or drinking restrictions.  Ask your health care provider about: ? Changing or stopping your regular medicines. This is especially important if you are taking diabetes medicines or blood thinners. ? Taking medicines such as aspirin and ibuprofen. These medicines can thin your blood. Do not take these medicines before your procedure if your health care provider instructs you not to.  Plan to have someone take you home after the procedure.  If you wear dentures, be ready to remove them before the procedure. What happens during the procedure?  To reduce your risk of infection, your health care team will wash or sanitize their hands.  An IV tube will be put in a vein in your hand or arm. You will get medicines and fluids through this tube.  You will be given one or more of the following: ?  A medicine to help you relax (sedative). ? A medicine to numb the area (local anesthetic). This medicine may be sprayed into your throat. It will make you feel more comfortable and keep you from gagging or coughing during the procedure. ? A medicine for pain.  A mouth guard may be placed in your mouth to protect your teeth and to keep you from biting on the endoscope.  You will be asked to lie on your left side.  The endoscope will be lowered down your throat into  your esophagus, stomach, and duodenum.  Air will be put into the endoscope. This will help your health care provider see better.  The lining of your esophagus, stomach, and duodenum will be examined.  Your health care provider may: ? Take a tissue sample so it can be looked at in a lab (biopsy). ? Remove growths. ? Remove objects (foreign bodies) that are stuck. ? Treat any bleeding with medicines or other devices that stop tissue from bleeding. ? Widen (dilate) or stretch narrowed areas of your esophagus and stomach.  The endoscope will be taken out. The procedure may vary among health care providers and hospitals. What happens after the procedure?  Your blood pressure, heart rate, breathing rate, and blood oxygen level will be monitored often until the medicines you were given have worn off.  Do not eat or drink anything until the numbing medicine has worn off and your gag reflex has returned. This information is not intended to replace advice given to you by your health care provider. Make sure you discuss any questions you have with your health care provider. Document Released: 05/11/2004 Document Revised: 06/16/2015 Document Reviewed: 12/02/2014 Elsevier Interactive Patient Education  2018 [employer]. Esophagogastroduodenoscopy, Care After Refer to this sheet in the next few weeks. These instructions provide you with information about caring for yourself after your procedure. Your health care provider may also give you more specific instructions. Your treatment has been planned according to current medical practices, but problems sometimes occur. Call your health care provider if you have any problems or questions after your procedure. What can I expect after the procedure? After the procedure, it is common to have:  A sore throat.  Nausea.  Bloating.  Dizziness.  Fatigue.  Follow these instructions at home:  Do not eat or drink anything until the numbing medicine  (local anesthetic) has worn off and your gag reflex has returned. You will know that the local anesthetic has worn off when you can swallow comfortably.  Do not drive for 24 hours if you received a medicine to help you relax (sedative).  If your health care provider took a tissue sample for testing during the procedure, make sure to get your test results. This is your responsibility. Ask your health care provider or the department performing the test when your results will be ready.  Keep all follow-up visits as told by your health care provider. This is important. Contact a health care provider if:  You cannot stop coughing.  You are not urinating.  You are urinating less than usual. Get help right away if:  You have trouble swallowing.  You cannot eat or drink.  You have throat or chest pain that gets worse.  You are dizzy or light-headed.  You faint.  You have nausea or vomiting.  You have chills.  You have a fever.  You have severe abdominal pain.  You have black, tarry, or bloody stools. This information is not intended to  replace advice given to you by your health care provider. Make sure you discuss any questions you have with your health care provider. Document Released: 12/26/2011 Document Revised: 06/16/2015 Document Reviewed: 12/02/2014 Elsevier Interactive Patient Education  2018 [employer].  Colonoscopy, Adult A colonoscopy is an exam to look at the entire large intestine. During the exam, a lubricated, bendable tube is inserted into the anus and then passed into the rectum, colon, and other parts of the large intestine. A colonoscopy is often done as a part of normal colorectal screening or in response to certain symptoms, such as anemia, persistent diarrhea, abdominal pain, and blood in the stool. The exam can help screen for and diagnose medical problems, including:  Tumors.  Polyps.  Inflammation.  Areas of bleeding.  Tell a health care provider  about:  Any allergies you have.  All medicines you are taking, including vitamins, herbs, eye drops, creams, and over-the-counter medicines.  Any problems you or family members have had with anesthetic medicines.  Any blood disorders you have.  Any surgeries you have had.  Any medical conditions you have.  Any problems you have had passing stool. What are the risks? Generally, this is a safe procedure. However, problems may occur, including:  Bleeding.  A tear in the intestine.  A reaction to medicines given during the exam.  Infection (rare).  What happens before the procedure? Eating and drinking restrictions Follow instructions from your health care provider about eating and drinking, which may include:  A few days before the procedure - follow a low-fiber diet. Avoid nuts, seeds, dried fruit, raw fruits, and vegetables.  1-3 days before the procedure - follow a clear liquid diet. Drink only clear liquids, such as clear broth or bouillon, black coffee or tea, clear juice, clear soft drinks or sports drinks, gelatin dessert, and popsicles. Avoid any liquids that contain red or purple dye.  On the day of the procedure - do not eat or drink anything during the 2 hours before the procedure, or within the time period that your health care provider recommends.  Bowel prep If you were prescribed an oral bowel prep to clean out your colon:  Take it as told by your health care provider. Starting the day before your procedure, you will need to drink a large amount of medicated liquid. The liquid will cause you to have multiple loose stools until your stool is almost clear or light green.  If your skin or anus gets irritated from diarrhea, you may use these to relieve the irritation: ? Medicated wipes, such as adult wet wipes with aloe and vitamin E. ? A skin soothing-product like petroleum jelly.  If you vomit while drinking the bowel prep, take a break for up to 60 minutes and  then begin the bowel prep again. If vomiting continues and you cannot take the bowel prep without vomiting, call your health care provider.  General instructions  Ask your health care provider about changing or stopping your regular medicines. This is especially important if you are taking diabetes medicines or blood thinners.  Plan to have someone take you home from the hospital or clinic. What happens during the procedure?  An IV tube may be inserted into one of your veins.  You will be given medicine to help you relax (sedative).  To reduce your risk of infection: ? Your health care team will wash or sanitize their hands. ? Your anal area will be washed with soap.  You will be  asked to lie on your side with your knees bent.  Your health care provider will lubricate a long, thin, flexible tube. The tube will have a camera and a light on the end.  The tube will be inserted into your anus.  The tube will be gently eased through your rectum and colon.  Air will be delivered into your colon to keep it open. You may feel some pressure or cramping.  The camera will be used to take images during the procedure.  A small tissue sample may be removed from your body to be examined under a microscope (biopsy). If any potential problems are found, the tissue will be sent to a lab for testing.  If small polyps are found, your health care provider may remove them and have them checked for cancer cells.  The tube that was inserted into your anus will be slowly removed. The procedure may vary among health care providers and hospitals. What happens after the procedure?  Your blood pressure, heart rate, breathing rate, and blood oxygen level will be monitored until the medicines you were given have worn off.  Do not drive for 24 hours after the exam.  You may have a small amount of blood in your stool.  You may pass gas and have mild abdominal cramping or bloating due to the air that was  used to inflate your colon during the exam.  It is up to you to get the results of your procedure. Ask your health care provider, or the department performing the procedure, when your results will be ready. This information is not intended to replace advice given to you by your health care provider. Make sure you discuss any questions you have with your health care provider. Document Released: 01/06/2000 Document Revised: 11/09/2015 Document Reviewed: 03/22/2015 Elsevier Interactive Patient Education  2018 [employer].  Colonoscopy, Adult, Care After This sheet gives you information about how to care for yourself after your procedure. Your health care provider may also give you more specific instructions. If you have problems or questions, contact your health care provider. What can I expect after the procedure? After the procedure, it is common to have:  A small amount of blood in your stool for 24 hours after the procedure.  Some gas.  Mild abdominal cramping or bloating.  Follow these instructions at home: General instructions   For the first 24 hours after the procedure: ? Do not drive or use machinery. ? Do not sign important documents. ? Do not drink alcohol. ? Do your regular daily activities at a slower pace than normal. ? Eat soft, easy-to-digest foods. ? Rest often.  Take over-the-counter or prescription medicines only as told by your health care provider.  It is up to you to get the results of your procedure. Ask your health care provider, or the department performing the procedure, when your results will be ready. Relieving cramping and bloating  Try walking around when you have cramps or feel bloated.  Apply heat to your abdomen as told by your health care provider. Use a heat source that your health care provider recommends, such as a moist heat pack or a heating pad. ? Place a towel between your skin and the heat source. ? Leave the heat on for 20-30  minutes. ? Remove the heat if your skin turns bright red. This is especially important if you are unable to feel pain, heat, or cold. You may have a greater risk of getting burned. Eating and  drinking  Drink enough fluid to keep your urine clear or pale yellow.  Resume your normal diet as instructed by your health care provider. Avoid heavy or fried foods that are hard to digest.  Avoid drinking alcohol for as long as instructed by your health care provider. Contact a health care provider if:  You have blood in your stool 2-3 days after the procedure. Get help right away if:  You have more than a small spotting of blood in your stool.  You pass large blood clots in your stool.  Your abdomen is swollen.  You have nausea or vomiting.  You have a fever.  You have increasing abdominal pain that is not relieved with medicine. This information is not intended to replace advice given to you by your health care provider. Make sure you discuss any questions you have with your health care provider. Document Released: 08/23/2003 Document Revised: 10/03/2015 Document Reviewed: 03/22/2015 Elsevier Interactive Patient Education  2018 [place] Anesthesia is a term that refers to techniques, procedures, and medicines that help a person stay safe and comfortable during a medical procedure. Monitored anesthesia care, or sedation, is one type of anesthesia. Your anesthesia specialist may recommend sedation if you will be having a procedure that does not require you to be unconscious, such as:  Cataract surgery.  A dental procedure.  A biopsy.  A colonoscopy.  During the procedure, you may receive a medicine to help you relax (sedative). There are three levels of sedation:  Mild sedation. At this level, you may feel awake and relaxed. You will be able to follow directions.  Moderate sedation. At this level, you will be sleepy. You may not remember the  procedure.  Deep sedation. At this level, you will be asleep. You will not remember the procedure.  The more medicine you are given, the deeper your level of sedation will be. Depending on how you respond to the procedure, the anesthesia specialist may change your level of sedation or the type of anesthesia to fit your needs. An anesthesia specialist will monitor you closely during the procedure. Let your health care provider know about:  Any allergies you have.  All medicines you are taking, including vitamins, herbs, eye drops, creams, and over-the-counter medicines.  Any use of steroids (by mouth or as a cream).  Any problems you or family members have had with sedatives and anesthetic medicines.  Any blood disorders you have.  Any surgeries you have had.  Any medical conditions you have, such as sleep apnea.  Whether you are pregnant or may be pregnant.  Any use of cigarettes, alcohol, or street drugs. What are the risks? Generally, this is a safe procedure. However, problems may occur, including:  Getting too much medicine (oversedation).  Nausea.  Allergic reaction to medicines.  Trouble breathing. If this happens, a breathing tube may be used to help with breathing. It will be removed when you are awake and breathing on your own.  Heart trouble.  Lung trouble.  Before the procedure Staying hydrated Follow instructions from your health care provider about hydration, which may include:  Up to 2 hours before the procedure - you may continue to drink clear liquids, such as water, clear fruit juice, black coffee, and plain tea.  Eating and drinking restrictions Follow instructions from your health care provider about eating and drinking, which may include:  8 hours before the procedure - stop eating heavy meals or foods such as meat, fried  foods, or fatty foods.  6 hours before the procedure - stop eating light meals or foods, such as toast or cereal.  6 hours  before the procedure - stop drinking milk or drinks that contain milk.  2 hours before the procedure - stop drinking clear liquids.  Medicines Ask your health care provider about:  Changing or stopping your regular medicines. This is especially important if you are taking diabetes medicines or blood thinners.  Taking medicines such as aspirin and ibuprofen. These medicines can thin your blood. Do not take these medicines before your procedure if your health care provider instructs you not to.  Tests and exams  You will have a physical exam.  You may have blood tests done to show: ? How well your kidneys and liver are working. ? How well your blood can clot.  General instructions  Plan to have someone take you home from the hospital or clinic.  If you will be going home right after the procedure, plan to have someone with you for 24 hours.  What happens during the procedure?  Your blood pressure, heart rate, breathing, level of pain and overall condition will be monitored.  An IV tube will be inserted into one of your veins.  Your anesthesia specialist will give you medicines as needed to keep you comfortable during the procedure. This may mean changing the level of sedation.  The procedure will be performed. After the procedure  Your blood pressure, heart rate, breathing rate, and blood oxygen level will be monitored until the medicines you were given have worn off.  Do not drive for 24 hours if you received a sedative.  You may: ? Feel sleepy, clumsy, or nauseous. ? Feel forgetful about what happened after the procedure. ? Have a sore throat if you had a breathing tube during the procedure. ? Vomit. This information is not intended to replace advice given to you by your health care provider. Make sure you discuss any questions you have with your health care provider. Document Released: 10/04/2004 Document Revised: 06/17/2015 Document Reviewed: 05/01/2015 Elsevier  Interactive Patient Education  2018 [place], Care After These instructions provide you with information about caring for yourself after your procedure. Your health care provider may also give you more specific instructions. Your treatment has been planned according to current medical practices, but problems sometimes occur. Call your health care provider if you have any problems or questions after your procedure. What can I expect after the procedure? After your procedure, it is common to:  Feel sleepy for several hours.  Feel clumsy and have poor balance for several hours.  Feel forgetful about what happened after the procedure.  Have poor judgment for several hours.  Feel nauseous or vomit.  Have a sore throat if you had a breathing tube during the procedure.  Follow these instructions at home: For at least 24 hours after the procedure:   Do not: ? Participate in activities in which you could fall or become injured. ? Drive. ? Use heavy machinery. ? Drink alcohol. ? Take sleeping pills or medicines that cause drowsiness. ? Make important decisions or sign legal documents. ? Take care of children on your own.  Rest. Eating and drinking  Follow the diet that is recommended by your health care provider.  If you vomit, drink water, juice, or soup when you can drink without vomiting.  Make sure you have little or no nausea before eating solid foods. General instructions  Have a responsible adult stay with you until you are awake and alert.  Take over-the-counter and prescription medicines only as told by your health care provider.  If you smoke, do not smoke without supervision.  Keep all follow-up visits as told by your health care provider. This is important. Contact a health care provider if:  You keep feeling nauseous or you keep vomiting.  You feel light-headed.  You develop a rash.  You have a fever. Get help right away  if:  You have trouble breathing. This information is not intended to replace advice given to you by your health care provider. Make sure you discuss any questions you have with your health care provider. Document Released: 05/01/2015 Document Revised: 08/31/2015 Document Reviewed: 05/01/2015 Elsevier Interactive Patient Education  [employer].

## 2016-10-18 ENCOUNTER — Encounter (HOSPITAL_COMMUNITY): Payer: Self-pay

## 2016-10-18 ENCOUNTER — Encounter (HOSPITAL_COMMUNITY)
Admission: RE | Admit: 2016-10-18 | Discharge: 2016-10-18 | Disposition: A | Discharge status: Home / Self Care | Payer: Medicare HMO | Source: Ambulatory Visit | Attending: Internal Medicine | Admitting: Internal Medicine

## 2016-10-18 DIAGNOSIS — K219 Gastro-esophageal reflux disease without esophagitis: Secondary | ICD-10-CM | POA: Diagnosis not present

## 2016-10-18 DIAGNOSIS — R1011 Right upper quadrant pain: Secondary | ICD-10-CM | POA: Diagnosis not present

## 2016-10-18 DIAGNOSIS — Z86010 Personal history of colonic polyps: Secondary | ICD-10-CM | POA: Diagnosis not present

## 2016-10-18 DIAGNOSIS — R9431 Abnormal electrocardiogram [ECG] [EKG]: Secondary | ICD-10-CM | POA: Diagnosis not present

## 2016-10-18 DIAGNOSIS — Z01818 Encounter for other preprocedural examination: Secondary | ICD-10-CM | POA: Diagnosis not present

## 2016-10-18 DIAGNOSIS — Z01812 Encounter for preprocedural laboratory examination: Secondary | ICD-10-CM | POA: Diagnosis not present

## 2016-10-18 HISTORY — DX: Unspecified osteoarthritis, unspecified site: M19.90

## 2016-10-18 LAB — BASIC METABOLIC PANEL
ANION GAP: 11 (ref 5–15)
BUN: 24 mg/dL — ABNORMAL HIGH (ref 6–20)
CHLORIDE: 106 mmol/L (ref 101–111)
CO2: 20 mmol/L — ABNORMAL LOW (ref 22–32)
Calcium: 9.1 mg/dL (ref 8.9–10.3)
Creatinine, Ser: 1.12 mg/dL — ABNORMAL HIGH (ref 0.44–1.00)
GFR, EST AFRICAN AMERICAN: 57 mL/min — AB (ref >60)
GFR, EST NON AFRICAN AMERICAN: 49 mL/min — AB (ref >60)
Glucose, Bld: 88 mg/dL (ref 65–99)
POTASSIUM: 4.6 mmol/L (ref 3.5–5.1)
SODIUM: 137 mmol/L (ref 135–145)

## 2016-10-18 LAB — CBC WITH DIFFERENTIAL/PLATELET
BASOS ABS: 0 10*3/uL (ref 0.0–0.1)
Basophils Relative: 0 %
EOS ABS: 0.2 10*3/uL (ref 0.0–0.7)
Eosinophils Relative: 2 %
HCT: 33 % — ABNORMAL LOW (ref 36.0–46.0)
Hemoglobin: 10.8 g/dL — ABNORMAL LOW (ref 12.0–15.0)
LYMPHS ABS: 2.4 10*3/uL (ref 0.7–4.0)
Lymphocytes Relative: 27 %
MCH: 29.3 pg (ref 26.0–34.0)
MCHC: 32.7 g/dL (ref 30.0–36.0)
MCV: 89.7 fL (ref 78.0–100.0)
Monocytes Absolute: 0.5 10*3/uL (ref 0.1–1.0)
Monocytes Relative: 6 %
Neutro Abs: 5.9 10*3/uL (ref 1.7–7.7)
Neutrophils Relative %: 65 %
PLATELETS: 240 10*3/uL (ref 150–400)
RBC: 3.68 MIL/uL — AB (ref 3.87–5.11)
RDW: 13.7 % (ref 11.5–15.5)
WBC: 9 10*3/uL (ref 4.0–10.5)

## 2016-10-24 ENCOUNTER — Ambulatory Visit (HOSPITAL_COMMUNITY)
Admission: RE | Admit: 2016-10-24 | Discharge: 2016-10-24 | Disposition: A | Discharge status: Home / Self Care | Payer: Medicare HMO | Source: Ambulatory Visit | Attending: Internal Medicine | Admitting: Internal Medicine

## 2016-10-24 ENCOUNTER — Ambulatory Visit (HOSPITAL_COMMUNITY): Payer: Medicare HMO | Admitting: Anesthesiology

## 2016-10-24 ENCOUNTER — Encounter (HOSPITAL_COMMUNITY): Payer: Self-pay | Admitting: *Deleted

## 2016-10-24 ENCOUNTER — Encounter (HOSPITAL_COMMUNITY)
Admission: RE | Disposition: A | Discharge status: Home / Self Care | Payer: Self-pay | Source: Ambulatory Visit | Attending: Internal Medicine

## 2016-10-24 DIAGNOSIS — R1013 Epigastric pain: Secondary | ICD-10-CM

## 2016-10-24 DIAGNOSIS — E119 Type 2 diabetes mellitus without complications: Secondary | ICD-10-CM | POA: Insufficient documentation

## 2016-10-24 DIAGNOSIS — Z86010 Personal history of colonic polyps: Secondary | ICD-10-CM | POA: Insufficient documentation

## 2016-10-24 DIAGNOSIS — F329 Major depressive disorder, single episode, unspecified: Secondary | ICD-10-CM | POA: Diagnosis not present

## 2016-10-24 DIAGNOSIS — R1011 Right upper quadrant pain: Secondary | ICD-10-CM

## 2016-10-24 DIAGNOSIS — Z9842 Cataract extraction status, left eye: Secondary | ICD-10-CM | POA: Insufficient documentation

## 2016-10-24 DIAGNOSIS — K449 Diaphragmatic hernia without obstruction or gangrene: Secondary | ICD-10-CM | POA: Diagnosis not present

## 2016-10-24 DIAGNOSIS — K5730 Diverticulosis of large intestine without perforation or abscess without bleeding: Secondary | ICD-10-CM | POA: Diagnosis not present

## 2016-10-24 DIAGNOSIS — R1310 Dysphagia, unspecified: Secondary | ICD-10-CM | POA: Diagnosis not present

## 2016-10-24 DIAGNOSIS — J449 Chronic obstructive pulmonary disease, unspecified: Secondary | ICD-10-CM | POA: Insufficient documentation

## 2016-10-24 DIAGNOSIS — K641 Second degree hemorrhoids: Secondary | ICD-10-CM | POA: Insufficient documentation

## 2016-10-24 DIAGNOSIS — K228 Other specified diseases of esophagus: Secondary | ICD-10-CM | POA: Diagnosis not present

## 2016-10-24 DIAGNOSIS — F17210 Nicotine dependence, cigarettes, uncomplicated: Secondary | ICD-10-CM | POA: Insufficient documentation

## 2016-10-24 DIAGNOSIS — Z888 Allergy status to other drugs, medicaments and biological substances status: Secondary | ICD-10-CM | POA: Diagnosis not present

## 2016-10-24 DIAGNOSIS — B9681 Helicobacter pylori [H. pylori] as the cause of diseases classified elsewhere: Secondary | ICD-10-CM | POA: Insufficient documentation

## 2016-10-24 DIAGNOSIS — Z809 Family history of malignant neoplasm, unspecified: Secondary | ICD-10-CM | POA: Insufficient documentation

## 2016-10-24 DIAGNOSIS — I4510 Unspecified right bundle-branch block: Secondary | ICD-10-CM | POA: Insufficient documentation

## 2016-10-24 DIAGNOSIS — Z803 Family history of malignant neoplasm of breast: Secondary | ICD-10-CM | POA: Insufficient documentation

## 2016-10-24 DIAGNOSIS — Z7984 Long term (current) use of oral hypoglycemic drugs: Secondary | ICD-10-CM | POA: Insufficient documentation

## 2016-10-24 DIAGNOSIS — I10 Essential (primary) hypertension: Secondary | ICD-10-CM | POA: Diagnosis not present

## 2016-10-24 DIAGNOSIS — F418 Other specified anxiety disorders: Secondary | ICD-10-CM | POA: Insufficient documentation

## 2016-10-24 DIAGNOSIS — Z885 Allergy status to narcotic agent status: Secondary | ICD-10-CM | POA: Insufficient documentation

## 2016-10-24 DIAGNOSIS — K2970 Gastritis, unspecified, without bleeding: Secondary | ICD-10-CM | POA: Diagnosis not present

## 2016-10-24 DIAGNOSIS — Z90710 Acquired absence of both cervix and uterus: Secondary | ICD-10-CM | POA: Diagnosis not present

## 2016-10-24 DIAGNOSIS — Z1211 Encounter for screening for malignant neoplasm of colon: Secondary | ICD-10-CM | POA: Insufficient documentation

## 2016-10-24 DIAGNOSIS — E7800 Pure hypercholesterolemia, unspecified: Secondary | ICD-10-CM | POA: Insufficient documentation

## 2016-10-24 DIAGNOSIS — Z8489 Family history of other specified conditions: Secondary | ICD-10-CM | POA: Insufficient documentation

## 2016-10-24 DIAGNOSIS — Z79899 Other long term (current) drug therapy: Secondary | ICD-10-CM | POA: Diagnosis not present

## 2016-10-24 DIAGNOSIS — Z6833 Body mass index (BMI) 33.0-33.9, adult: Secondary | ICD-10-CM | POA: Insufficient documentation

## 2016-10-24 DIAGNOSIS — Z7982 Long term (current) use of aspirin: Secondary | ICD-10-CM | POA: Diagnosis not present

## 2016-10-24 DIAGNOSIS — Z8249 Family history of ischemic heart disease and other diseases of the circulatory system: Secondary | ICD-10-CM | POA: Insufficient documentation

## 2016-10-24 DIAGNOSIS — Z9841 Cataract extraction status, right eye: Secondary | ICD-10-CM | POA: Diagnosis not present

## 2016-10-24 DIAGNOSIS — E669 Obesity, unspecified: Secondary | ICD-10-CM | POA: Insufficient documentation

## 2016-10-24 DIAGNOSIS — F419 Anxiety disorder, unspecified: Secondary | ICD-10-CM | POA: Insufficient documentation

## 2016-10-24 DIAGNOSIS — M1990 Unspecified osteoarthritis, unspecified site: Secondary | ICD-10-CM | POA: Insufficient documentation

## 2016-10-24 DIAGNOSIS — K219 Gastro-esophageal reflux disease without esophagitis: Secondary | ICD-10-CM | POA: Insufficient documentation

## 2016-10-24 HISTORY — PX: ESOPHAGOGASTRODUODENOSCOPY (EGD) WITH PROPOFOL: SHX5813

## 2016-10-24 HISTORY — PX: BIOPSY: SHX5522

## 2016-10-24 HISTORY — PX: COLONOSCOPY WITH PROPOFOL: SHX5780

## 2016-10-24 LAB — GLUCOSE, CAPILLARY
GLUCOSE-CAPILLARY: 92 mg/dL (ref 65–99)
Glucose-Capillary: 73 mg/dL (ref 65–99)

## 2016-10-24 SURGERY — COLONOSCOPY WITH PROPOFOL
Anesthesia: Monitor Anesthesia Care

## 2016-10-24 MED ORDER — LIDOCAINE VISCOUS 2 % MT SOLN
15.0000 mL | Freq: Once | OROMUCOSAL | Status: AC
  Administered 2016-10-24: 5 mL via OROMUCOSAL

## 2016-10-24 MED ORDER — MIDAZOLAM HCL 2 MG/2ML IJ SOLN
1.0000 mg | INTRAMUSCULAR | Status: AC
  Administered 2016-10-24: 2 mg via INTRAVENOUS

## 2016-10-24 MED ORDER — CHLORHEXIDINE GLUCONATE CLOTH 2 % EX PADS: 6.0000 | MEDICATED_PAD | Freq: Once | CUTANEOUS | Status: DC

## 2016-10-24 MED ORDER — FENTANYL CITRATE (PF) 100 MCG/2ML IJ SOLN
25.0000 ug | Freq: Once | INTRAMUSCULAR | Status: AC
  Administered 2016-10-24: 25 ug via INTRAVENOUS

## 2016-10-24 MED ORDER — LIDOCAINE VISCOUS 2 % MT SOLN
OROMUCOSAL | Status: AC
  Filled 2016-10-24: qty 15

## 2016-10-24 MED ORDER — PROPOFOL 500 MG/50ML IV EMUL
INTRAVENOUS | Status: DC | PRN
  Administered 2016-10-24 (×2): via INTRAVENOUS
  Administered 2016-10-24: 125 ug/kg/min via INTRAVENOUS

## 2016-10-24 MED ORDER — LACTATED RINGERS IV SOLN
INTRAVENOUS | Status: DC
  Administered 2016-10-24: 13:00:00 via INTRAVENOUS

## 2016-10-24 MED ORDER — PROPOFOL 10 MG/ML IV BOLUS
INTRAVENOUS | Status: DC | PRN
  Administered 2016-10-24: 10 mg via INTRAVENOUS
  Administered 2016-10-24: 20 mg via INTRAVENOUS
  Administered 2016-10-24: 10 mg via INTRAVENOUS
  Administered 2016-10-24: 20 mg via INTRAVENOUS

## 2016-10-24 MED ORDER — PROPOFOL 10 MG/ML IV BOLUS
INTRAVENOUS | Status: AC
  Filled 2016-10-24: qty 40

## 2016-10-24 MED ORDER — MIDAZOLAM HCL 2 MG/2ML IJ SOLN
INTRAMUSCULAR | Status: AC
  Filled 2016-10-24: qty 2

## 2016-10-24 MED ORDER — FENTANYL CITRATE (PF) 100 MCG/2ML IJ SOLN
INTRAMUSCULAR | Status: AC
  Filled 2016-10-24: qty 2

## 2016-10-24 NOTE — Anesthesia Postprocedure Evaluation (Signed)
Anesthesia Post Note  Patient: [name]  Procedure(s) Performed: COLONOSCOPY WITH PROPOFOL (N/A ) ESOPHAGOGASTRODUODENOSCOPY (EGD) WITH PROPOFOL (N/A ) BIOPSY  Patient location during evaluation: PACU Anesthesia Type: MAC Level of consciousness: awake and alert and oriented Pain management: pain level controlled Vital Signs Assessment: post-procedure vital signs reviewed and stable Respiratory status: spontaneous breathing Cardiovascular status: blood pressure returned to baseline and stable Postop Assessment: no apparent nausea or vomiting Anesthetic complications: no     Last Vitals:  Vitals:   10/24/16 1415 10/24/16 1430  BP: 124/63 128/64  Pulse: 73 61  Resp: (!) 21 19  Temp: 36.8 C   SpO2: 96% 95%    Last Pain:  Vitals:   10/24/16 1415  TempSrc:   PainSc: 0-No pain                 Jamera Vanloan

## 2016-10-24 NOTE — Discharge Instructions (Signed)
CT abdomen and pelvis  w/ wo contrast to further evaluate abdominal pain  Diverticulosis information provided  Repeat colonoscopy in 5 years  Further recommendations to follow pending review of pathology report  Diverticulosis Diverticulosis is a condition that develops when small pouches (diverticula) form in the wall of the large intestine (colon). The colon is where water is absorbed and stool is formed. The pouches form when the inside layer of the colon pushes through weak spots in the outer layers of the colon. You may have a few pouches or many of them. What are the causes? The cause of this condition is not known. What increases the risk? The following factors may make you more likely to develop this condition:  Being older than age 58. Your risk for this condition increases with age. Diverticulosis is rare among people younger than age 51. By age 33, many people have it.  Eating a low-fiber diet.  Having frequent constipation.  Being overweight.  Not getting enough exercise.  Smoking.  Taking over-the-counter pain medicines, like aspirin and ibuprofen.  Having a family history of diverticulosis.  What are the signs or symptoms? In most people, there are no symptoms of this condition. If you do have symptoms, they may include:  Bloating.  Cramps in the abdomen.  Constipation or diarrhea.  Pain in the lower left side of the abdomen.  How is this diagnosed? This condition is most often diagnosed during an exam for other colon problems. Because diverticulosis usually has no symptoms, it often cannot be diagnosed independently. This condition may be diagnosed by:  Using a flexible scope to examine the colon (colonoscopy).  Taking an X-ray of the colon after dye has been put into the colon (barium enema).  Doing a CT scan.  How is this treated? You may not need treatment for this condition if you have never developed an infection related to diverticulosis. If  you have had an infection before, treatment may include:  Eating a high-fiber diet. This may include eating more fruits, vegetables, and grains.  Taking a fiber supplement.  Taking a live bacteria supplement (probiotic).  Taking medicine to relax your colon.  Taking antibiotic medicines.  Follow these instructions at home:  Drink 6-8 glasses of water or more each day to prevent constipation.  Try not to strain when you have a bowel movement.  If you have had an infection before: ? Eat more fiber as directed by your health care provider or your diet and nutrition specialist (dietitian). ? Take a fiber supplement or probiotic, if your health care provider approves.  Take over-the-counter and prescription medicines only as told by your health care provider.  If you were prescribed an antibiotic, take it as told by your health care provider. Do not stop taking the antibiotic even if you start to feel better.  Keep all follow-up visits as told by your health care provider. This is important. Contact a health care provider if:  You have pain in your abdomen.  You have bloating.  You have cramps.  You have not had a bowel movement in 3 days. Get help right away if:  Your pain gets worse.  Your bloating becomes very bad.  You have a fever or chills, and your symptoms suddenly get worse.  You vomit.  You have bowel movements that are bloody or black.  You have bleeding from your rectum. Summary  Diverticulosis is a condition that develops when small pouches (diverticula) form in the wall of the  large intestine (colon).  You may have a few pouches or many of them.  This condition is most often diagnosed during an exam for other colon problems.  If you have had an infection related to diverticulosis, treatment may include increasing the fiber in your diet, taking supplements, or taking medicines. This information is not intended to replace advice given to you by your  health care provider. Make sure you discuss any questions you have with your health care provider. Document Released: 10/06/2003 Document Revised: 11/28/2015 Document Reviewed: 11/28/2015 Elsevier Interactive Patient Education  2017 [place].    Colonoscopy, Adult, Care After This sheet gives you information about how to care for yourself after your procedure. Your health care provider may also give you more specific instructions. If you have problems or questions, contact your health care provider. What can I expect after the procedure? After the procedure, it is common to have:  A small amount of blood in your stool for 24 hours after the procedure.  Some gas.  Mild abdominal cramping or bloating.  Follow these instructions at home: General instructions   For the first 24 hours after the procedure: ? Do not drive or use machinery. ? Do not sign important documents. ? Do not drink alcohol. ? Do your regular daily activities at a slower pace than normal. ? Eat soft, easy-to-digest foods. ? Rest often.  Take over-the-counter or prescription medicines only as told by your health care provider.  It is up to you to get the results of your procedure. Ask your health care provider, or the department performing the procedure, when your results will be ready. Relieving cramping and bloating  Try walking around when you have cramps or feel bloated.  Apply heat to your abdomen as told by your health care provider. Use a heat source that your health care provider recommends, such as a moist heat pack or a heating pad. ? Place a towel between your skin and the heat source. ? Leave the heat on for 20-30 minutes. ? Remove the heat if your skin turns bright red. This is especially important if you are unable to feel pain, heat, or cold. You may have a greater risk of getting burned. Eating and drinking  Drink enough fluid to keep your urine clear or pale yellow.  Resume your normal  diet as instructed by your health care provider. Avoid heavy or fried foods that are hard to digest.  Avoid drinking alcohol for as long as instructed by your health care provider. Contact a health care provider if:  You have blood in your stool 2-3 days after the procedure. Get help right away if:  You have more than a small spotting of blood in your stool.  You pass large blood clots in your stool.  Your abdomen is swollen.  You have nausea or vomiting.  You have a fever.  You have increasing abdominal pain that is not relieved with medicine. This information is not intended to replace advice given to you by your health care provider. Make sure you discuss any questions you have with your health care provider. Document Released: 08/23/2003 Document Revised: 10/03/2015 Document Reviewed: 03/22/2015 Elsevier Interactive Patient Education  2018 [place]. Gastrointestinal Endoscopy, Care After Refer to this sheet in the next few weeks. These instructions provide you with information about caring for yourself after your procedure. Your health care provider may also give you more specific instructions. Your treatment has been planned according to current medical practices, but  problems sometimes occur. Call your health care provider if you have any problems or questions after your procedure. What can I expect after the procedure? After your procedure, it is common to feel:  Bloated.  Soreness in your throat.  Sleepy.  Follow these instructions at home:  Do not drive for 24 hours if you received a if you received a medicine to help you relax (sedative).  Avoid drinking warm beverages and alcohol for the first 24 hours after the procedure.  Take over-the-counter and prescription medicines only as told by your health care provider.  Drink enough fluids to keep your urine clear or pale yellow.  If you feel bloated, try going for a walk. Walking may help the feeling go  away.  If your throat is sore, try gargling with salt water. Get help right away if:  You have severe nausea or vomiting.  You have severe abdominal pain, abdominal cramps that last longer than 6 hours, or abdominal swelling.  You have severe shoulder or back pain.  You have trouble swallowing.  You have shortness of breath, your breathing is shallow, or you breathing is faster than normal.  You have a fever.  Your heart is beating very fast.  You vomit blood or material that looks like coffee grounds.  You have bloody, black, or tarry stools. This information is not intended to replace advice given to you by your health care provider. Make sure you discuss any questions you have with your health care provider. Document Released: 08/23/2003 Document Revised: 11/16/2015 Document Reviewed: 10/31/2014 Elsevier Interactive Patient Education  2018 [employer].

## 2016-10-24 NOTE — H&P (Signed)
@LOGO @   Primary Care Physician:  Susy Frizzle, MD Primary Gastroenterologist:  Dr. Gala Romney Pre-Procedure History & Physical: HPI:  [name] is a 71 y.o. female here for surveillance colonoscopy given history of colon polyps. Also having vague odynophagia and retrosternal burning. No dysphagia. Plan for EGD today as well.  Past Medical History:  Diagnosis Date  . Allergy   . Anxiety   . Arthritis   . Asthma   . Bursitis   . Cataract   . COPD (chronic obstructive pulmonary disease) ([place])   . Depression   . Diabetes mellitus   . Hiatal hernia   . Hypercholesteremia   . Hypertension   . Shortness of breath    chronic brochitis  . Ulcer     Past Surgical History:  Procedure Laterality Date  . ABDOMINAL HYSTERECTOMY    . ANKLE ARTHROSCOPY WITH DRILLING/MICROFRACTURE Left 10/12/2015   Procedure: LEFT ANKLE ARTHROSCOPY, REMOVAL OF OSTEOCHONDRAL LESION WITH MICROFRACTURE;  Surgeon: Trula Slade, DPM;  Location: [facility];  Service: Podiatry;  Laterality: Left;  . CATARACT EXTRACTION W/PHACO  10/16/2010   Procedure: CATARACT EXTRACTION PHACO AND INTRAOCULAR LENS PLACEMENT (IOC);  Surgeon: Tonny Branch;  Location: [facility];  Service: Ophthalmology;  Laterality: Left;  CDE: 6.99  . CATARACT EXTRACTION W/PHACO  12/18/2010   Procedure: CATARACT EXTRACTION PHACO AND INTRAOCULAR LENS PLACEMENT (IOC);  Surgeon: Tonny Branch;  Location: [facility];  Service: Ophthalmology;  Laterality: Right;  CDE 12.50  . COLONOSCOPY     about 5 years ago/[facility]  . ESOPHAGOGASTRODUODENOSCOPY  2003   Gastritis, path unavailable.  [facility] EYE SURGERY    . fracture lower back    . left elbow tendon repair    . NEUROPLASTY / TRANSPOSITION MEDIAN NERVE AT CARPAL TUNNEL BILATERAL    . plantar fasc bilateral    . right rotator cuff    . rt foot reconstruction    . trigger thumb     Left    Prior to Admission medications   Medication Sig Start Date End Date Taking? Authorizing Provider   aspirin EC 81 MG tablet Take 1 tablet (81 mg total) by mouth daily. 12/07/15  Yes Dixon, Stanton Kidney B, PA-C  atorvastatin (LIPITOR) 80 MG tablet TAKE ONE TABLET BY MOUTH ONCE DAILY. 08/14/16  Yes Susy Frizzle, MD  budesonide-formoterol [facility]) 160-4.5 MCG/ACT inhaler Inhale 2 puffs into the lungs 2 (two) times daily. Patient taking differently: Inhale 2 puffs into the lungs daily.  07/16/16  Yes [place], Modena Nunnery, MD  lisinopril (PRINIVIL,ZESTRIL) 40 MG tablet TAKE (1) TABLET BY MOUTH ONCE DAILY. Patient taking differently: TAKE (1) TABLET BY MOUTH ONCE DAILY IN THE EVENING 09/25/16  Yes [place], Modena Nunnery, MD  montelukast (SINGULAIR) 10 MG tablet TAKE 1 TABLET BY MOUTH ONCE DAILY AS NEEDED FOR ALLERGIES. Patient taking differently: TAKE 1 TABLET BY MOUTH ONCE IN THE EVENING 09/25/16  Yes Dixon, Mary B, PA-C  pioglitazone (ACTOS) 15 MG tablet TAKE (1) TABLET BY MOUTH ONCE DAILY. 09/25/16  Yes [place], Modena Nunnery, MD  Polyethyl Glycol-Propyl Glycol (SYSTANE OP) Apply 1 drop to eye 3 (three) times daily.   Yes [provider]  promethazine (PHENERGAN) 12.5 MG tablet Take 1 tablet (12.5 mg total) by mouth every 8 (eight) hours as needed for nausea or vomiting. 10/12/15  Yes Trula Slade, DPM  traZODone (DESYREL) 50 MG tablet TAKE 3 TABLETS BY MOUTH AT BEDTIME AS NEEDED FOR SLEEP. 08/14/16  Yes Susy Frizzle, MD  venlafaxine XR (EFFEXOR-XR) 75 MG 24 hr capsule TAKE 2 CAPSULES BY MOUTH DAILY. 09/25/16  Yes [place], Modena Nunnery, MD  VENTOLIN HFA 108 (90 Base) MCG/ACT inhaler INHALE 2 PUFFS EVERY 4 - 6 HOURS AS NEEDED FOR SHORTNESS OF BREATH ORWHEEZING. Patient taking differently: INHALE 2 PUFFS EVERY 4 - 6 HOURS AS NEEDED FOR SHORTNESS OF BREATH OR WHEEZING. 09/25/16  Yes [place], Lonell Grandchild F, MD  glucose blood test strip 1 each by Other route 2 (two) times daily. Use as instructed 03/28/15   Dena Billet B, PA-C  polyethylene glycol-electrolytes (TRILYTE) 420 g solution Take 4,000 mLs by mouth as directed.  09/21/16   Daneil Dolin, MD    Allergies as of 09/21/2016 - Review Complete 09/21/2016  Allergen Reaction Noted  . Codeine Itching   . Flonase [fluticasone propionate]  12/06/2010  . Metformin and related Diarrhea 03/28/2015    Family History  Problem Relation Age of Onset  . Breast cancer Daughter   . Heart disease Mother   . Hyperlipidemia Mother   . Hyperlipidemia Father   . Cancer Sister   . Birth defects Grandchild   . Anesthesia problems Neg Hx   . Hypotension Neg Hx   . Malignant hyperthermia Neg Hx   . Pseudochol deficiency Neg Hx   . Colon cancer Neg Hx     Social History   Social History  . Marital status: Single    Spouse name: N/A  . Number of children: N/A  . Years of education: N/A   Occupational History  . Not on file.   Social History Main Topics  . Smoking status: Current Every Day Smoker    Packs/day: 0.50    Years: 52.00    Types: Cigarettes  . Smokeless tobacco: Never Used  . Alcohol use Yes     Comment: rare  . Drug use: Yes    Frequency: 3.0 times per week    Types: Marijuana     Comment: 1-3 times week  . Sexual activity: Not Currently    Birth control/ protection: Surgical   Other Topics Concern  . Not on file   Social History Narrative  . No narrative on file    Review of Systems: See HPI, otherwise negative ROS  Physical Exam: BP 125/61   Pulse 73   Resp (!) 27   Ht 5\' 6"  (1.676 m)   Wt 210 lb (95.3 kg)   SpO2 93%   BMI 33.89 kg/m  General:   Alert, , Angela and cooperative in NAD Neck:  Supple; no masses or thyromegaly. No significant cervical adenopathy. Lungs:  Clear throughout to auscultation.   No wheezes, crackles, or rhonchi. No acute distress. Heart:  Regular rate and rhythm; no murmurs, clicks, rubs,  or gallops. Abdomen: Non-distended, normal bowel sounds.  Soft with some epigastric and right upper quadrant tenderness to palpation.  Pulses:  Normal pulses noted. Extremities:  Without clubbing or  edema.  Impression:   [name] here for surveillance colonoscopy. Also with recent epigastric pain and odynophagia symptoms of dysphagia. Needs EGD as well.  Recommendations:   I have offereed the patient an EGD and colonoscopy today per plan. The risks, benefits, limitations, imponderables and alternatives regarding both EGD and colonoscopy have been reviewed with the patient. Questions have been answered. All parties agreeable.    Notice: This dictation was prepared with Dragon dictation along with smaller phrase technology. Any transcriptional errors that result from this process are unintentional and may not be  corrected upon review.

## 2016-10-24 NOTE — Op Note (Signed)
[facility] Patient Name: [name] Procedure Date: 10/24/2016 1:49 PM MRN: [id number] Date of Birth: 07-Nov-1945 Attending MD: Norvel Richards , MD CSN: [id number] Age: 71 Admit Type: Outpatient Procedure:                Colonoscopy Indications:              High risk colon cancer surveillance: Personal                            history of colonic polyps Providers:                Norvel Richards, MD, Gwenlyn Fudge RN, RN,                            Randa Spike, Technician Referring MD:              Medicines:                Propofol per Anesthesia Complications:            No immediate complications. Estimated Blood Loss:     Estimated blood loss: none. Procedure:                Pre-Anesthesia Assessment:                           - Prior to the procedure, a History and Physical                            was performed, and patient medications and                            allergies were reviewed. The patient's tolerance of                            previous anesthesia was also reviewed. The risks                            and benefits of the procedure and the sedation                            options and risks were discussed with the patient.                            All questions were answered, and informed consent                            was obtained. Prior Anticoagulants: The patient has                            taken no previous anticoagulant or antiplatelet                            agents. ASA Grade Assessment: II - A patient with  mild systemic disease. After reviewing the risks                            and benefits, the patient was deemed in                            satisfactory condition to undergo the procedure.                           After obtaining informed consent, the colonoscope                            was passed under direct vision. Throughout the                            procedure, the  patient's blood pressure, pulse, and                            oxygen saturations were monitored continuously. The                            EC-[id number] ([id number]) scope was introduced through                            the and advanced to the the cecum, identified by                            appendiceal orifice and ileocecal valve. The                            colonoscopy was performed without difficulty. The                            patient tolerated the procedure well. The quality                            of the bowel preparation was adequate. The entire                            colon was well visualized. The quality of the bowel                            preparation was adequate. The ileocecal valve,                            appendiceal orifice, and rectum were photographed. Scope In: 1:53:25 PM Scope Out: 2:06:35 PM Scope Withdrawal Time: 0 hours 6 minutes 39 seconds  Total Procedure Duration: 0 hours 13 minutes 10 seconds  Findings:      The perianal and digital rectal examinations were normal.      Scattered small and large-mouthed diverticula were found in the sigmoid       colon and descending colon.      Non-bleeding internal hemorrhoids were found during endoscopy. The  hemorrhoids were moderate and Grade II (internal hemorrhoids that       prolapse but reduce spontaneously).      The exam was otherwise without abnormality. Impression:               - Diverticulosis in the sigmoid colon and in the                            descending colon.                           - Non-bleeding internal hemorrhoids.                           - The examination was otherwise normal.                           - No specimens collected. Moderate Sedation:      Moderate (conscious) sedation was personally administered by an       anesthesia professional. The following parameters were monitored: oxygen       saturation, heart rate, blood pressure, respiratory rate, EKG,  adequacy       of pulmonary ventilation, and response to care. Total physician       intraservice time was 35 minutes. Recommendation:           - Patient has a contact number available for                            emergencies. The signs and symptoms of potential                            delayed complications were discussed with the                            patient. Return to normal activities tomorrow.                            Written discharge instructions were provided to the                            patient.                           - Resume previous diet.                           - Continue present medications.                           - Repeat colonoscopy in 5 years for surveillance.                           - Return to GI clinic (date not yet determined).                            See EGD report Procedure Code(s):        --- Professional ---  45378, Colonoscopy, flexible; diagnostic, including                            collection of specimen(s) by brushing or washing,                            when performed (separate procedure) Diagnosis Code(s):        --- Professional ---                           Z86.010, Personal history of colonic polyps                           K64.1, Second degree hemorrhoids                           K57.30, Diverticulosis of large intestine without                            perforation or abscess without bleeding CPT copyright 2016 American [employer]. All rights reserved. The codes documented in this report are preliminary and upon coder review may  be revised to meet current compliance requirements. Cristopher Estimable. Sofya Moustafa, MD Norvel Richards, MD 10/24/2016 2:15:31 PM This report has been signed electronically. Number of Addenda: 0

## 2016-10-24 NOTE — Transfer of Care (Signed)
Immediate Anesthesia Transfer of Care Note  Patient: [name]  Procedure(s) Performed: COLONOSCOPY WITH PROPOFOL (N/A ) ESOPHAGOGASTRODUODENOSCOPY (EGD) WITH PROPOFOL (N/A ) BIOPSY  Patient Location: PACU  Anesthesia Type:MAC  Level of Consciousness: awake and alert   Airway & Oxygen Therapy: Patient Spontanous Breathing and Patient connected to nasal cannula oxygen  Post-op Assessment: Report given to RN  Post vital signs: Reviewed and stable  Last Vitals:  Vitals:   10/24/16 1320 10/24/16 1325  BP:    Pulse:    Resp: 18 (!) 27  SpO2: 91% (!) 89%    Last Pain:  Vitals:   10/24/16 1321  TempSrc:   PainSc: 6       Patients Stated Pain Goal: 8 (31/43/88 8757)  Complications: No apparent anesthesia complications

## 2016-10-24 NOTE — Op Note (Signed)
[facility] Patient Name: [name] Procedure Date: 10/24/2016 1:10 PM MRN: [id number] Date of Birth: 06-Dec-1945 Attending MD: Norvel Richards , MD CSN: [id number] Age: 71 Admit Type: Outpatient Procedure:                Upper GI endoscopy Indications:              Epigastric abdominal pain, Abdominal pain in the                            right upper quadrant Providers:                Norvel Richards, MD, Otis Peak B. Sharon Seller, RN,                            Randa Spike, Technician Referring MD:              Medicines:                Propofol per Anesthesia Complications:            No immediate complications. Estimated Blood Loss:     Estimated blood loss was minimal. Procedure:                Pre-Anesthesia Assessment:                           - Prior to the procedure, a History and Physical                            was performed, and patient medications and                            allergies were reviewed. The patient's tolerance of                            previous anesthesia was also reviewed. The risks                            and benefits of the procedure and the sedation                            options and risks were discussed with the patient.                            All questions were answered, and informed consent                            was obtained. Prior Anticoagulants: The patient has                            taken no previous anticoagulant or antiplatelet                            agents. ASA Grade Assessment: II - A patient with  mild systemic disease. After reviewing the risks                            and benefits, the patient was deemed in                            satisfactory condition to undergo the procedure.                           After obtaining informed consent, the endoscope was                            passed under direct vision. Throughout the                            procedure,  the patient's blood pressure, pulse, and                            oxygen saturations were monitored continuously. The                            EG-[id number] ([id number]) scope was introduced through the                            and advanced to the second part of duodenum. The                            upper GI endoscopy was accomplished without                            difficulty. The patient tolerated the procedure                            well. Scope In: 1:40:43 PM Scope Out: 1:46:35 PM Total Procedure Duration: 0 hours 5 minutes 52 seconds  Findings:      Mild mucosal changes were found in the esophagus. 1-3 mm nodules       throughout to soft body. No plaques erosions or ulcerations.      Mucosal changes were found in the stomach; . some snake skinning and       mottling of the gastric mucosa. No ulcer or infiltrating process seen.       This was biopsied with a cold forceps for histology. Estimated blood       loss was minimal. Esophagus was biopsied as well.      A small hiatal hernia was present.      The duodenal bulb and second portion of the duodenum were normal. Impression:               - Mucosal changes in the esophagus.biopsied                           - Mucosal changes in the. Biopsied.                           - Small hiatal hernia.                           -  Normal duodenal bulb and second portion of the                            duodenum. Moderate Sedation:      Moderate (conscious) sedation was personally administered by an       anesthesia professional. The following parameters were monitored: oxygen       saturation, heart rate, blood pressure, respiratory rate, EKG, adequacy       of pulmonary ventilation, and response to care. Total physician       intraservice time was 14 minutes. Recommendation:           - Patient has a contact number available for                            emergencies. The signs and symptoms of potential                             delayed complications were discussed with the                            patient. Return to normal activities tomorrow.                            Written discharge instructions were provided to the                            patient.                           - Resume previous diet.                           - Continue present medications.                           - Await pathology results.                           - No repeat upper endoscopy.                           - Return to GI office (date not yet determined).                            See colonoscopy report. Procedure Code(s):        --- Professional ---                           [phone], Esophagogastroduodenoscopy, flexible,                            transoral; with biopsy, single or multiple Diagnosis Code(s):        --- Professional ---                           K22.8, Other specified diseases of esophagus  K31.89, Other diseases of stomach and duodenum                           K44.9, Diaphragmatic hernia without obstruction or                            gangrene                           R10.13, Epigastric pain                           R10.11, Right upper quadrant pain CPT copyright 2016 American Medical Association. All rights reserved. The codes documented in this report are preliminary and upon coder review may  be revised to meet current compliance requirements. [name], MD Norvel Richards, MD 10/24/2016 2:09:19 PM This report has been signed electronically. Number of Addenda: 0

## 2016-10-24 NOTE — Anesthesia Preprocedure Evaluation (Signed)
Anesthesia Evaluation  Patient identified by MRN, date of birth, ID band Patient awake    Reviewed: Allergy & Precautions, NPO status , Patient's Chart, lab work & pertinent test results  History of Anesthesia Complications Negative for: history of anesthetic complications  Airway Mallampati: III  TM Distance: >3 FB Neck ROM: Full    Dental  (+) Dental Advisory Given   Pulmonary asthma , sleep apnea , COPD,  COPD inhaler, neg recent URI, Current Smoker,    Pulmonary exam normal breath sounds clear to auscultation- rhonchi       Cardiovascular hypertension, Pt. on medications (-) angina(-) Past MI, (-) Cardiac Stents and (-) Orthopnea + dysrhythmias (RBBB)  Rhythm:Regular Rate:Normal  HLD   Neuro/Psych neg Seizures PSYCHIATRIC DISORDERS Anxiety Depression    GI/Hepatic Neg liver ROS, hiatal hernia, GERD  ,  Endo/Other  diabetes, Type 2  Renal/GU negative Renal ROS     Musculoskeletal  (+) Arthritis ,   Abdominal (+) + obese,   Peds  Hematology negative hematology ROS (+)   Anesthesia Other Findings   Reproductive/Obstetrics                             Anesthesia Physical Anesthesia Plan  ASA: III  Anesthesia Plan: MAC   Post-op Pain Management:    Induction: Intravenous  PONV Risk Score and Plan:   Airway Management Planned: Simple Face Mask  Additional Equipment:   Intra-op Plan:   Post-operative Plan:   Informed Consent: I have reviewed the patients History and Physical, chart, labs and discussed the procedure including the risks, benefits and alternatives for the proposed anesthesia with the patient or authorized representative who has indicated his/her understanding and acceptance.     Plan Discussed with:   Anesthesia Plan Comments:         Anesthesia Quick Evaluation

## 2016-10-25 ENCOUNTER — Telehealth: Payer: Self-pay | Admitting: Internal Medicine

## 2016-10-25 NOTE — Telephone Encounter (Signed)
Per RMR pt needs to schedule CT abd/pelvis w/wo contrast to further evaluate abdominal pain

## 2016-10-28 ENCOUNTER — Encounter: Payer: Self-pay | Admitting: Internal Medicine

## 2016-10-29 ENCOUNTER — Other Ambulatory Visit: Payer: Self-pay

## 2016-10-29 ENCOUNTER — Encounter (HOSPITAL_COMMUNITY): Payer: Self-pay | Admitting: Internal Medicine

## 2016-10-29 DIAGNOSIS — R109 Unspecified abdominal pain: Secondary | ICD-10-CM

## 2016-10-29 NOTE — Telephone Encounter (Signed)
PA info for CT abd/pelvis with contrast submitted via [employer] website for [facility]. Case approved. PA# [id number], 11/07/16-12/07/16.

## 2016-10-29 NOTE — Telephone Encounter (Signed)
Per Loma Sousa at [facility], order needs to be for CT abd/pelvis with contrast. She said the only time they do w w/o contrast is for hematuria or bladder mass.  CT abd/pelvis w/contrast scheduled for 11/07/16 at 2:00pm, pt to arrive at 1:45pm. NPO 4 hours before test. Pt to pick-up contrast before day of test. Tried to call pt, LMOVM and informed her of appt. Letter mailed.

## 2016-10-29 NOTE — Telephone Encounter (Signed)
Pt called office and was informed of CT appt.

## 2016-10-30 ENCOUNTER — Encounter: Payer: Self-pay | Admitting: Internal Medicine

## 2016-10-30 ENCOUNTER — Telehealth: Payer: Self-pay

## 2016-10-30 NOTE — Telephone Encounter (Signed)
Letter mailed to the pt. 

## 2016-10-30 NOTE — Telephone Encounter (Signed)
OV made and letter mailed °

## 2016-10-30 NOTE — Telephone Encounter (Signed)
Per RMR-  Rourk, Cristopher Estimable, MD  Claudina Lick, LPN; Theadora Rama        Send letter to patient.  Send copy of letter with path to referring provider and PCP.   Patient needs PrevPak or generic equivalent x 14 days--hold any acid suppression and/or statin therapy patient may be taking during treatment; make sure twice a day Prevacid 30 mg is included in the regimen..   Would offer office visit in 2 months if not already scheduled

## 2016-11-06 ENCOUNTER — Telehealth: Payer: Self-pay | Admitting: Internal Medicine

## 2016-11-06 MED ORDER — AMOXICILL-CLARITHRO-LANSOPRAZ PO MISC: Freq: Two times a day (BID) | ORAL | 0 refills | Status: DC

## 2016-11-06 NOTE — Telephone Encounter (Signed)
Pt called to get clarification on the letter sent to her a few days ago. Pt wasn't sure if her antibiotic had been called in and wasn't sure what she needed it for.  Pt notified that her antibiotic Prev Pac was called in to pharmacy to treat helicobacter a bacteria in her stomach.

## 2016-11-06 NOTE — Telephone Encounter (Signed)
Received fax from the pharmacy, insurance will not pay for prevpac. They will pay for generics. I have called in generics to [facility].

## 2016-11-06 NOTE — Telephone Encounter (Signed)
Please call patient about her h-pylori medication, she received a letter about her results

## 2016-11-06 NOTE — Telephone Encounter (Signed)
rx has been sent to the pharmacy. Pt is aware.

## 2016-11-07 ENCOUNTER — Ambulatory Visit (HOSPITAL_COMMUNITY)
Admission: RE | Admit: 2016-11-07 | Discharge: 2016-11-07 | Disposition: A | Discharge status: Home / Self Care | Payer: Medicare HMO | Source: Ambulatory Visit | Attending: Internal Medicine | Admitting: Internal Medicine

## 2016-11-07 DIAGNOSIS — R109 Unspecified abdominal pain: Secondary | ICD-10-CM | POA: Insufficient documentation

## 2016-11-07 DIAGNOSIS — R1011 Right upper quadrant pain: Secondary | ICD-10-CM | POA: Diagnosis not present

## 2016-11-07 DIAGNOSIS — I700 Atherosclerosis of aorta: Secondary | ICD-10-CM | POA: Insufficient documentation

## 2016-11-07 MED ORDER — IOPAMIDOL (ISOVUE-300) INJECTION 61%
100.0000 mL | Freq: Once | INTRAVENOUS | Status: AC | PRN
  Administered 2016-11-07: 100 mL via INTRAVENOUS

## 2016-11-08 MED ORDER — FLUCONAZOLE 150 MG PO TABS
ORAL_TABLET | ORAL | 0 refills | Status: DC
Start: 2016-11-08 — End: 2017-01-18

## 2016-11-08 MED ORDER — FLUCONAZOLE 150 MG PO TABS: ORAL_TABLET | ORAL | 0 refills | Status: DC

## 2016-11-08 NOTE — Telephone Encounter (Signed)
[name], I phoned in pts Generic Prev Pack to [place] per pts request. Med was already sent previously to another pharmacy. Pt also wants to know if she can get a pill to treat yeast which she takes when on antibiotics to prevent yeast infections. Please advise.

## 2016-11-08 NOTE — Telephone Encounter (Signed)
RX sent

## 2016-11-08 NOTE — Addendum Note (Signed)
Addended by: Mahala Menghini on: 11/08/2016 03:38 PM   Modules accepted: Orders

## 2016-11-08 NOTE — Telephone Encounter (Signed)
Pt called office and said that her antibiotic wasn't at her pharmacy. She needs the PrevPac sent to [facility]. She also requests medication in case she gets a yeast infection. She frequently gets yeast infection when taking antibiotics.  Routing to AM.

## 2016-11-13 ENCOUNTER — Telehealth: Payer: Self-pay | Admitting: Internal Medicine

## 2016-11-13 NOTE — Telephone Encounter (Signed)
Spoke with pt about her antibiotic. Pt is taking med with food and felt a little different since she started taking med. I explained to pt that she should not lay down after taking each dose for at least 1-2 hrs because it can cause some irritation with the esophagus. Pt needs to also make sure she is drinking fluids when taking antibiotics. After talking to pt about how to take her medication, she felt much better and will call back if she has any problems.

## 2016-11-13 NOTE — Telephone Encounter (Signed)
[phone] please call patient she has a medication question

## 2016-11-19 ENCOUNTER — Other Ambulatory Visit: Payer: Self-pay | Admitting: Family Medicine

## 2016-11-20 ENCOUNTER — Telehealth: Payer: Self-pay

## 2016-11-20 NOTE — Telephone Encounter (Signed)
Imodium will be okay. Complete Prevpac without fail.

## 2016-11-20 NOTE — Telephone Encounter (Signed)
Called and informed pt.  

## 2016-11-20 NOTE — Telephone Encounter (Signed)
Pt called office. She has 2 more days left of Prevpac. She has been having diarrhea and it got worse yesterday. No other symptoms. She wants to know if she can take Imodium? [phone]  Routing to RMR.

## 2016-11-23 ENCOUNTER — Other Ambulatory Visit: Payer: Self-pay | Admitting: Family Medicine

## 2016-11-23 DIAGNOSIS — J209 Acute bronchitis, unspecified: Secondary | ICD-10-CM

## 2016-12-28 ENCOUNTER — Other Ambulatory Visit: Payer: Self-pay | Admitting: Family Medicine

## 2016-12-28 MED ORDER — BUDESONIDE-FORMOTEROL FUMARATE 160-4.5 MCG/ACT IN AERO: 2.0000 | INHALATION_SPRAY | Freq: Two times a day (BID) | RESPIRATORY_TRACT | 3 refills | Status: DC

## 2016-12-31 ENCOUNTER — Other Ambulatory Visit: Payer: Self-pay | Admitting: Family Medicine

## 2016-12-31 ENCOUNTER — Ambulatory Visit: Payer: Medicare HMO | Admitting: Gastroenterology

## 2016-12-31 DIAGNOSIS — J209 Acute bronchitis, unspecified: Secondary | ICD-10-CM

## 2017-01-02 ENCOUNTER — Other Ambulatory Visit: Payer: Self-pay | Admitting: Family Medicine

## 2017-01-02 NOTE — Telephone Encounter (Signed)
Medication refilled per protocol. 

## 2017-01-08 ENCOUNTER — Other Ambulatory Visit: Payer: Self-pay | Admitting: Family Medicine

## 2017-01-18 ENCOUNTER — Ambulatory Visit (INDEPENDENT_AMBULATORY_CARE_PROVIDER_SITE_OTHER): Payer: Medicare HMO | Admitting: Nurse Practitioner

## 2017-01-18 ENCOUNTER — Encounter: Payer: Self-pay | Admitting: Nurse Practitioner

## 2017-01-18 ENCOUNTER — Other Ambulatory Visit: Payer: Self-pay | Admitting: Family Medicine

## 2017-01-18 VITALS — BP 154/90 | HR 93 | Temp 97.0°F | Ht 65.0 in | Wt 204.0 lb

## 2017-01-18 DIAGNOSIS — B9681 Helicobacter pylori [H. pylori] as the cause of diseases classified elsewhere: Secondary | ICD-10-CM | POA: Diagnosis not present

## 2017-01-18 DIAGNOSIS — K219 Gastro-esophageal reflux disease without esophagitis: Secondary | ICD-10-CM | POA: Diagnosis not present

## 2017-01-18 DIAGNOSIS — K2970 Gastritis, unspecified, without bleeding: Secondary | ICD-10-CM

## 2017-01-18 DIAGNOSIS — R1011 Right upper quadrant pain: Secondary | ICD-10-CM

## 2017-01-18 NOTE — Assessment & Plan Note (Signed)
Chronic GERD symptoms with intermittent flares about 1-2 times a month.  When she does have a flare, she takes baking soda.  Overall feels symptoms are improved.  Confirmation of H. pylori eradication as per above.  Follow-up in 2 months for symptom progression.  No need to start PPI at this time given rare/intermittent symptoms.  Call with any worsening symptoms.

## 2017-01-18 NOTE — Progress Notes (Signed)
Referring Provider: Susy Frizzle, MD Primary Care Physician:  Susy Frizzle, MD Primary GI:  Dr. Gala Romney  Chief Complaint  Patient presents with  . Abdominal Pain    feels a lot of pressure  . Nausea    HPI:   [name] is a 71 y.o. female who presents for follow-up on abdominal pain and GERD.  Also complains of nausea.  The patient was last seen in our office 09/21/2016 for similar complaints.  At that time she is also due for colonoscopy.  True H. pylori positive serologies.  Last EGD 2003 with gastritis and pathology unavailable.  Her last colonoscopy prior to her last visit completed by Dr. Henrene Pastor in 2012 which found tubular adenoma, negative random colon biopsies, internal hemorrhoids, recommended repeat exam in 2017.  At her last visit she complained of a several month history of right upper quadrant/epigastric pain with or without food like a muscle spasm.  Stretching helps.  Epigastric discomfort more food related, early satiety and bloating after eating.  Significant GERD symptoms currently using baking soda.  Omeprazole without relief.  Occasional regurgitation if she attempts to belch to feel better.  Abdominal ultrasound unremarkable.  Recommended endoscopy and colonoscopy.  EGD unremarkable consider CT of the abdomen to evaluate lipase elevation at 70 and unexplained symptoms.  EGD completed 10/24/2016 which found esophageal mucosal changes status post biopsy, gastric mucosal changes status post biopsy, small hiatal hernia, normal duodenum.  Surgical pathology found esophageal biopsies consistent with reflux changes.  Gastric biopsies positive for H. pylori gastritis.  The patient was treated with generic versions of Prevpac.  Colonoscopy completed the same day found sigmoid and descending colon diverticulosis, nonbleeding internal hemorrhoids, otherwise normal.  Recommended repeat exam in 5 years.  CT of the abdomen and pelvis was ordered and completed on 11/07/2016  which found unremarkable liver and gallbladder, no biliary ductal dilation, stomach, small bowel, appendix and colon are normal.  Atherosclerotic calcification of the arterial vasculature without abdominal aortic aneurysm, mesenteries and peritoneum are unremarkable.  Overall impression: No findings to explain patient's symptoms, aortic atherosclerosis.  Today she states she's doing ok overall. Completed antibiotics. Had diarrhea on abx. No current diarrhea but has frequent stools and sometimes it passes easily [facility] 4) and sometimes she feels like she needs to go but can't. She has abdominal pressure. Denies abdominal pain, no further bloating. Drinks 2 glasses of water a day, eats moderate to adequate fiber daily. Denies hematochezia. Admits dark/thick stools "quite often." States her abdominal issues all started when she started Metformin. Has since stopped Metformin. Chronic GERD symptoms. Has symptoms every other week and uses baking soda/water (symptoms improved currently). Also states she gets sick to her stomach and then has to sneeze at which point she "get really sick to my stomach and then eventually it goes away." Has significant seasonal allergies. Denies chest pain, dyspnea, dizziness, lightheadedness, syncope, near syncope. Denies any other upper or lower GI symptoms.   Past Medical History:  Diagnosis Date  . Allergy   . Anxiety   . Arthritis   . Asthma   . Bursitis   . Cataract   . COPD (chronic obstructive pulmonary disease) ([place])   . Depression   . Diabetes mellitus   . Hiatal hernia   . Hypercholesteremia   . Hypertension   . Shortness of breath    chronic brochitis  . Ulcer     Past Surgical History:  Procedure Laterality Date  . ABDOMINAL  HYSTERECTOMY    . ANKLE ARTHROSCOPY WITH DRILLING/MICROFRACTURE Left 10/12/2015   Procedure: LEFT ANKLE ARTHROSCOPY, REMOVAL OF OSTEOCHONDRAL LESION WITH MICROFRACTURE;  Surgeon: Trula Slade, DPM;  Location: [facility];  Service: Podiatry;  Laterality: Left;  . BIOPSY  10/24/2016   Procedure: BIOPSY;  Surgeon: Daneil Dolin, MD;  Location: [facility];  Service: Endoscopy;;  gastric esophageal  . CATARACT EXTRACTION W/PHACO  10/16/2010   Procedure: CATARACT EXTRACTION PHACO AND INTRAOCULAR LENS PLACEMENT ([place]);  Surgeon: Tonny Branch;  Location: [facility];  Service: Ophthalmology;  Laterality: Left;  CDE: 6.99  . CATARACT EXTRACTION W/PHACO  12/18/2010   Procedure: CATARACT EXTRACTION PHACO AND INTRAOCULAR LENS PLACEMENT ([facility]);  Surgeon: Tonny Branch;  Location: [facility];  Service: Ophthalmology;  Laterality: Right;  CDE 12.50  . COLONOSCOPY     about 5 years ago/[facility]  . COLONOSCOPY WITH PROPOFOL N/A 10/24/2016   Procedure: COLONOSCOPY WITH PROPOFOL;  Surgeon: Daneil Dolin, MD;  Location: [facility];  Service: Endoscopy;  Laterality: N/A;  215  . ESOPHAGOGASTRODUODENOSCOPY  2003   Gastritis, path unavailable.  . ESOPHAGOGASTRODUODENOSCOPY (EGD) WITH PROPOFOL N/A 10/24/2016   Procedure: ESOPHAGOGASTRODUODENOSCOPY (EGD) WITH PROPOFOL;  Surgeon: Daneil Dolin, MD;  Location: [facility];  Service: Endoscopy;  Laterality: N/A;  . EYE SURGERY    . fracture lower back    . left elbow tendon repair    . NEUROPLASTY / TRANSPOSITION MEDIAN NERVE AT CARPAL TUNNEL BILATERAL    . plantar fasc bilateral    . right rotator cuff    . rt foot reconstruction    . trigger thumb     Left    Current Outpatient Medications  Medication Sig Dispense Refill  . glucose blood test strip 1 each by Other route 2 (two) times daily. Use as instructed 200 each 3  . lisinopril (PRINIVIL,ZESTRIL) 40 MG tablet TAKE (1) TABLET BY MOUTH ONCE DAILY. (Patient taking differently: TAKE (1) TABLET BY MOUTH ONCE DAILY IN THE EVENING) 90 tablet 0  . montelukast (SINGULAIR) 10 MG tablet TAKE 1 TABLET BY MOUTH ONCE DAILY AS NEEDED FOR ALLERGIES. (Patient taking differently: TAKE 1 TABLET BY MOUTH ONCE IN THE EVENING) 90 tablet 0   . pioglitazone (ACTOS) 15 MG tablet TAKE (1) TABLET BY MOUTH ONCE DAILY. 90 tablet 0  . Polyethyl Glycol-Propyl Glycol (SYSTANE OP) Apply 1 drop to eye 3 (three) times daily.    . promethazine (PHENERGAN) 12.5 MG tablet Take 1 tablet (12.5 mg total) by mouth every 8 (eight) hours as needed for nausea or vomiting. 30 tablet 0  . traZODone (DESYREL) 50 MG tablet TAKE 3 TABLETS BY MOUTH AT BEDTIME AS NEEDED FOR SLEEP. 270 tablet 0  . VENTOLIN HFA 108 (90 Base) MCG/ACT inhaler INHALE 2 PUFFS EVERY 4 TO 6 HOURS AS NEEDED FOR WHEEZING AND SHORTNESS OF BREATH. 18 g 4  . atorvastatin (LIPITOR) 80 MG tablet TAKE (1) TABLET BY MOUTH ONCE DAILY. (Patient not taking: Reported on 01/18/2017) 90 tablet 0  . SYMBICORT 160-4.5 MCG/ACT inhaler INHALE 2 PUFFS INTO LUNGS TWICE DAILY. (Patient not taking: Reported on 01/18/2017) 10.2 g 0  . venlafaxine XR (EFFEXOR-XR) 75 MG 24 hr capsule TAKE 2 CAPSULES BY MOUTH DAILY. (Patient not taking: Reported on 01/18/2017) 180 capsule 0   No current facility-administered medications for this visit.     Allergies as of 01/18/2017 - Review Complete 01/18/2017  Allergen Reaction Noted  . Codeine Itching   . Flonase [fluticasone propionate]  Cough 12/06/2010  . Metformin and related Diarrhea 03/28/2015    Family History  Problem Relation Age of Onset  . Breast cancer Daughter   . Heart disease Mother   . Hyperlipidemia Mother   . Hyperlipidemia Father   . Cancer Sister   . Birth defects Grandchild   . Anesthesia problems Neg Hx   . Hypotension Neg Hx   . Malignant hyperthermia Neg Hx   . Pseudochol deficiency Neg Hx   . Colon cancer Neg Hx     Social History   Socioeconomic History  . Marital status: Single    Spouse name: None  . Number of children: None  . Years of education: None  . Highest education level: None  Social Needs  . Financial resource strain: None  . Food insecurity - worry: None  . Food insecurity - inability: None  . Transportation  needs - medical: None  . Transportation needs - non-medical: None  Occupational History  . None  Tobacco Use  . Smoking status: Current Every Day Smoker    Packs/day: 0.50    Years: 52.00    Pack years: 26.00    Types: Cigarettes  . Smokeless tobacco: Never Used  Substance and Sexual Activity  . Alcohol use: Yes    Comment: rare  . Drug use: Yes    Frequency: 3.0 times per week    Types: Marijuana    Comment: 1-3 times week  . Sexual activity: Not Currently    Birth control/protection: Surgical  Other Topics Concern  . None  Social History Narrative  . None    Review of Systems: General: Negative for anorexia, weight loss, fever, chills, fatigue, weakness. ENT: Negative for hoarseness, difficulty swallowing. CV: Negative for chest pain, angina, palpitations, peripheral edema.  Respiratory: Negative for dyspnea at rest, cough, sputum, wheezing.  GI: See history of present illness. MS: S/p right hip replacement and occasional hip discomfort.  Endo: Negative for unusual weight change.  Heme: Negative for bruising or bleeding.   Physical Exam: BP (!) 154/90   Pulse 93   Temp (!) 97 F (36.1 C) (Oral)   Ht 5\' 5"  (1.651 m)   Wt 204 lb (92.5 kg)   BMI 33.95 kg/m  General:   Alert and oriented. Pleasant and cooperative. Well-nourished and well-developed.  Eyes:  Without icterus, sclera clear and conjunctiva pink.  Ears:  Normal auditory acuity. Cardiovascular:  S1, S2 present without murmurs appreciated. Extremities without clubbing or edema. Respiratory:  Clear to auscultation bilaterally. No wheezes, rales, or rhonchi. No distress.  Gastrointestinal:  +BS, soft, and non-distended. Mild lower abdominal TTP. No HSM noted. No guarding or rebound. No masses appreciated.  Rectal:  Deferred  Musculoskalatal:  Symmetrical without gross deformities. Neurologic:  Alert and oriented x4;  grossly normal neurologically. Psych:  Alert and cooperative. Normal mood and  affect. Heme/Lymph/Immune: No excessive bruising noted.    01/18/2017 11:18 AM   Disclaimer: This note was dictated with voice recognition software. Similar sounding words can inadvertently be transcribed and may not be corrected upon review.

## 2017-01-18 NOTE — Patient Instructions (Signed)
1. Complete your breath test at the lab when you are able to. 2. Start taking a daily fiber supplement of your choice.  If you have questions at the pharmacy you can consult with the pharmacist. 3. Call us in 1-2 weeks and let us know if this is helping decrease the frequency of difficulty in having a bowel movement/abdominal pressure. 4. Return for follow-up in 2 months.

## 2017-01-18 NOTE — Assessment & Plan Note (Signed)
Some persistent abdominal pain mostly lower abdomen and right lower quadrant.  She describes a pattern of bowel movements where she goes every day but some days she has [place] 4, easy to pass stools and some day she has a lot of pressure and difficulty passing stools/unable to go.  She likely has an element of constipation contributing to her lower abdominal pain.  She states her pain does improve when she has a good bowel movement.  At this point given her intermittent constipation I will have her start daily fiber.  She can call with a progress report in 1-2 weeks.  If she has persistent symptoms we can consider other options such as prescription medicine or other over-the-counter options such as Colace and/or merely.  Return for follow-up in 2 months.

## 2017-01-18 NOTE — Assessment & Plan Note (Signed)
H. pylori gastritis status post treatment with Prevpac generic version.  At this point we will send her for H. pylori breath test to confirm eradication.  Not currently on a PPI.  Return for follow-up in 2 months.

## 2017-01-21 NOTE — Progress Notes (Signed)
cc'ed to pcp °

## 2017-01-24 ENCOUNTER — Encounter: Payer: Self-pay | Admitting: Physician Assistant

## 2017-01-24 ENCOUNTER — Ambulatory Visit (INDEPENDENT_AMBULATORY_CARE_PROVIDER_SITE_OTHER): Payer: Medicare HMO | Admitting: Physician Assistant

## 2017-01-24 ENCOUNTER — Other Ambulatory Visit: Payer: Self-pay

## 2017-01-24 VITALS — BP 116/78 | HR 103 | Temp 97.9°F | Resp 16 | Ht 65.0 in | Wt 200.4 lb

## 2017-01-24 DIAGNOSIS — I10 Essential (primary) hypertension: Secondary | ICD-10-CM | POA: Diagnosis not present

## 2017-01-24 DIAGNOSIS — F17200 Nicotine dependence, unspecified, uncomplicated: Secondary | ICD-10-CM

## 2017-01-24 DIAGNOSIS — G4733 Obstructive sleep apnea (adult) (pediatric): Secondary | ICD-10-CM

## 2017-01-24 DIAGNOSIS — E119 Type 2 diabetes mellitus without complications: Secondary | ICD-10-CM

## 2017-01-24 DIAGNOSIS — E7800 Pure hypercholesterolemia, unspecified: Secondary | ICD-10-CM | POA: Diagnosis not present

## 2017-01-24 DIAGNOSIS — J439 Emphysema, unspecified: Secondary | ICD-10-CM | POA: Diagnosis not present

## 2017-01-24 DIAGNOSIS — N183 Chronic kidney disease, stage 3 unspecified: Secondary | ICD-10-CM

## 2017-01-24 DIAGNOSIS — K219 Gastro-esophageal reflux disease without esophagitis: Secondary | ICD-10-CM | POA: Diagnosis not present

## 2017-01-24 MED ORDER — VENLAFAXINE HCL ER 75 MG PO CP24: 150.0000 mg | ORAL_CAPSULE | Freq: Every day | ORAL | 0 refills | Status: DC

## 2017-01-24 MED ORDER — ATORVASTATIN CALCIUM 80 MG PO TABS: ORAL_TABLET | ORAL | 1 refills | Status: DC

## 2017-01-24 MED ORDER — BUPROPION HCL ER (SR) 150 MG PO TB12: 150.0000 mg | ORAL_TABLET | Freq: Two times a day (BID) | ORAL | 2 refills | Status: DC

## 2017-01-24 NOTE — Progress Notes (Signed)
Patient ID: [name] MRN: [id number], DOB: [birth year], 72 y.o. Date of Encounter: @DATE @  Chief Complaint:  Chief Complaint  Patient presents with  . Medication Refill  . Fatigue  . Nasal Congestion    HPI: 72 y.o. year old female  presents with above.   Today I reviewed that her last routine visit with me was 12/07/2015. Today she reports that she does continue to take her Actos for her diabetes.  Is having no adverse effects with this. Today she reports that she is continuing to take the lisinopril daily.  This is causing no lightheadedness or other adverse effects. She reports that the Lipitor ran out about 2 months ago.  This was causing no myalgias or other adverse effects. She reports that she has been out of Effexor for about 2 weeks.  She has been off of Wellbutrin for quite a while.  She is feeling like she needs to get back on both of these.  Reports that over the last couple weeks since being off of medication she has not been wanting to go anywhere.  Says that she just wants to stay at home by herself.  Also gets teary and cries easily.  Says that she "wants to feel like a normal person again.  Wants to be in a good mood ".  Says that some mornings when she wakes up feels like she has a hangover and this has just started happening over the last couple weeks. No other specific concerns to address today.   Past Medical History:  Diagnosis Date  . Allergy   . Anxiety   . Arthritis   . Asthma   . Bursitis   . Cataract   . COPD (chronic obstructive pulmonary disease) ([place])   . Depression   . Diabetes mellitus   . Hiatal hernia   . Hypercholesteremia   . Hypertension   . Shortness of breath    chronic brochitis  . Ulcer      Home Meds: Outpatient Medications Prior to Visit  Medication Sig Dispense Refill  . glucose blood test strip 1 each by Other route 2 (two) times daily. Use as instructed 200 each 3  . lisinopril (PRINIVIL,ZESTRIL) 40 MG tablet TAKE  (1) TABLET BY MOUTH ONCE DAILY. (Patient taking differently: TAKE (1) TABLET BY MOUTH ONCE DAILY IN THE EVENING) 90 tablet 0  . montelukast (SINGULAIR) 10 MG tablet TAKE 1 TABLET BY MOUTH ONCE DAILY AS NEEDED FOR ALLERGIES. (Patient taking differently: TAKE 1 TABLET BY MOUTH ONCE IN THE EVENING) 90 tablet 0  . pioglitazone (ACTOS) 15 MG tablet TAKE (1) TABLET BY MOUTH ONCE DAILY. 90 tablet 0  . Polyethyl Glycol-Propyl Glycol (SYSTANE OP) Apply 1 drop to eye 3 (three) times daily.    . promethazine (PHENERGAN) 12.5 MG tablet Take 1 tablet (12.5 mg total) by mouth every 8 (eight) hours as needed for nausea or vomiting. 30 tablet 0  . traZODone (DESYREL) 50 MG tablet TAKE 3 TABLETS BY MOUTH AT BEDTIME AS NEEDED FOR SLEEP. 270 tablet 0  . SYMBICORT 160-4.5 MCG/ACT inhaler INHALE 2 PUFFS INTO LUNGS TWICE DAILY. (Patient not taking: Reported on 01/18/2017) 10.2 g 0  . VENTOLIN HFA 108 (90 Base) MCG/ACT inhaler INHALE 2 PUFFS EVERY 4 TO 6 HOURS AS NEEDED FOR WHEEZING AND SHORTNESS OF BREATH. (Patient not taking: Reported on 01/24/2017) 18 g 4  . atorvastatin (LIPITOR) 80 MG tablet TAKE (1) TABLET BY MOUTH ONCE DAILY. (Patient not taking: Reported on 01/18/2017)  90 tablet 0  . venlafaxine XR (EFFEXOR-XR) 75 MG 24 hr capsule TAKE 2 CAPSULES BY MOUTH DAILY. (Patient not taking: Reported on 01/24/2017) 180 capsule 0   No facility-administered medications prior to visit.     Allergies:  Allergies  Allergen Reactions  . Codeine Itching  . Flonase [Fluticasone Propionate] Cough    Severe coughing  . Metformin And Related Diarrhea    Caused severe diarrhea and severe GI symptoms---even on low dose 500mg  BID    Social History   Socioeconomic History  . Marital status: Single    Spouse name: Not on file  . Number of children: Not on file  . Years of education: Not on file  . Highest education level: Not on file  Social Needs  . Financial resource strain: Not on file  . Food insecurity - worry: Not on  file  . Food insecurity - inability: Not on file  . Transportation needs - medical: Not on file  . Transportation needs - non-medical: Not on file  Occupational History  . Not on file  Tobacco Use  . Smoking status: Current Every Day Smoker    Packs/day: 0.50    Years: 52.00    Pack years: 26.00    Types: Cigarettes  . Smokeless tobacco: Never Used  Substance and Sexual Activity  . Alcohol use: Yes    Comment: rare  . Drug use: Yes    Frequency: 3.0 times per week    Types: Marijuana    Comment: 1-3 times week  . Sexual activity: Not Currently    Birth control/protection: Surgical  Other Topics Concern  . Not on file  Social History Narrative  . Not on file    Family History  Problem Relation Age of Onset  . Breast cancer Daughter   . Heart disease Mother   . Hyperlipidemia Mother   . Hyperlipidemia Father   . Cancer Sister   . Birth defects Grandchild   . Anesthesia problems Neg Hx   . Hypotension Neg Hx   . Malignant hyperthermia Neg Hx   . Pseudochol deficiency Neg Hx   . Colon cancer Neg Hx      Review of Systems:  See HPI for pertinent ROS. All other ROS negative.    Physical Exam: Blood pressure 116/78, pulse (!) 103, temperature 97.9 F (36.6 C), temperature source Oral, resp. rate 16, height 5\' 5"  (1.651 m), weight 90.9 kg (200 lb 6.4 oz), SpO2 99 %., Body mass index is 33.35 kg/m. General: Overweight WF. Appears in no acute distress. Neck: Supple. No thyromegaly. No lymphadenopathy.  No carotid bruits. Lungs: Clear bilaterally to auscultation without wheezes, rales, or rhonchi. Breathing is unlabored. Heart: RRR with S1 S2. No murmurs, rubs, or gallops. Abdomen: Soft, non-tender, non-distended with normoactive bowel sounds. No hepatomegaly. No rebound/guarding. No obvious abdominal masses. Musculoskeletal:  Strength and tone normal for age. Extremities/Skin: Warm and dry.  No edema.  Diabetic foot exam: Inspection is normal.  There are no wounds or  problem areas. Neuro: Alert and oriented X 3. Moves all extremities spontaneously. Gait is normal. CNII-XII grossly in tact. Psych:  Responds to questions appropriately with a normal affect.     ASSESSMENT AND PLAN:  72 y.o. year old female with   Essential hypertension Blood pressure is at goal.  Continue current medication.  Check lab to monitor. - COMPLETE METABOLIC PANEL WITH GFR  Pulmonary emphysema, unspecified emphysema type ([place]) This is stable on current medication.  She is on  Symbicort as maintenance medication and has albuterol available to use if needed.  Diabetes mellitus without complication ([place]) She is due to check a microalbumin A1c and CME T.  Will check these labs and then adjust medications if necessary/accordingly. - Microalbumin, urine - Hemoglobin A1c - COMPLETE METABOLIC PANEL WITH GFR  Chronic kidney disease (CKD), stage III (moderate) ([facility]) This is on ACE inhibitor for renal protection.  We will check lab to monitor. - COMPLETE METABOLIC PANEL WITH GFR  Hypercholesteremia She has been out of Lipitor for about 2 months.   I did go ahead and send a refill of this today.   Will go ahead and check lab.  She had 2 small cups of coffee about 3 hours prior to these labs.  In the coffee had 2% milk and Splenda.  Otherwise is fasting. - COMPLETE METABOLIC PANEL WITH GFR - Lipid panel  Anxiety, Depression Will restart the Effexor and Wellbutrin now.  Will follow-up and reassess at next visit.  Follow-up sooner if needed.    Her last visit regarding all of these chronic medical problems was November 2017.   Discussed need for routine follow-up visit in 3 months or sooner.  [address] [place], [place], [facility] 01/24/2017 1:11 PM

## 2017-01-25 LAB — MICROALBUMIN, URINE: MICROALB UR: 0.5 mg/dL

## 2017-01-25 LAB — LIPID PANEL
CHOL/HDL RATIO: 5.8 (calc) — AB (ref <5.0)
CHOLESTEROL: 309 mg/dL — AB (ref <200)
HDL: 53 mg/dL (ref >50)
LDL Cholesterol (Calc): 218 mg/dL (calc) — ABNORMAL HIGH
Non-HDL Cholesterol (Calc): 256 mg/dL (calc) — ABNORMAL HIGH (ref <130)
Triglycerides: 198 mg/dL — ABNORMAL HIGH (ref <150)

## 2017-01-25 LAB — COMPLETE METABOLIC PANEL WITH GFR
AG RATIO: 1.5 (calc) (ref 1.0–2.5)
ALBUMIN MSPROF: 4.4 g/dL (ref 3.6–5.1)
ALKALINE PHOSPHATASE (APISO): 102 U/L (ref 33–130)
ALT: 27 U/L (ref 6–29)
AST: 34 U/L (ref 10–35)
BILIRUBIN TOTAL: 0.3 mg/dL (ref 0.2–1.2)
BUN / CREAT RATIO: 19 (calc) (ref 6–22)
BUN: 24 mg/dL (ref 7–25)
CO2: 27 mmol/L (ref 20–32)
CREATININE: 1.29 mg/dL — AB (ref 0.50–0.99)
Calcium: 9.7 mg/dL (ref 8.6–10.4)
Chloride: 100 mmol/L (ref 98–110)
GFR, Est African American: 49 mL/min/{1.73_m2} — ABNORMAL LOW (ref >=60)
GFR, Est Non African American: 42 mL/min/{1.73_m2} — ABNORMAL LOW (ref >=60)
GLOBULIN: 3 g/dL (ref 1.9–3.7)
Glucose, Bld: 104 mg/dL — ABNORMAL HIGH (ref 65–99)
Potassium: 5.1 mmol/L (ref 3.5–5.3)
SODIUM: 137 mmol/L (ref 135–146)
Total Protein: 7.4 g/dL (ref 6.1–8.1)

## 2017-01-25 LAB — HEMOGLOBIN A1C
HEMOGLOBIN A1C: 5.9 %{Hb} — AB (ref <5.7)
Mean Plasma Glucose: 123 (calc)
eAG (mmol/L): 6.8 (calc)

## 2017-02-15 ENCOUNTER — Other Ambulatory Visit: Payer: Self-pay | Admitting: Family Medicine

## 2017-02-21 ENCOUNTER — Other Ambulatory Visit: Payer: Self-pay | Admitting: Physician Assistant

## 2017-02-22 ENCOUNTER — Ambulatory Visit: Payer: Medicare HMO | Admitting: Gastroenterology

## 2017-02-22 NOTE — Telephone Encounter (Signed)
Refill appropriate 

## 2017-03-20 ENCOUNTER — Ambulatory Visit: Payer: Medicare HMO | Admitting: Nurse Practitioner

## 2017-03-29 ENCOUNTER — Other Ambulatory Visit: Payer: Self-pay | Admitting: Family Medicine

## 2017-04-11 ENCOUNTER — Other Ambulatory Visit: Payer: Self-pay | Admitting: Physician Assistant

## 2017-04-11 ENCOUNTER — Other Ambulatory Visit: Payer: Self-pay | Admitting: Family Medicine

## 2017-04-12 NOTE — Telephone Encounter (Signed)
Refill appropriate 

## 2017-04-25 ENCOUNTER — Ambulatory Visit: Payer: Medicare HMO | Admitting: Physician Assistant

## 2017-04-25 ENCOUNTER — Ambulatory Visit: Payer: Medicare HMO | Admitting: Family Medicine

## 2017-04-26 ENCOUNTER — Other Ambulatory Visit: Payer: Self-pay | Admitting: Physician Assistant

## 2017-05-02 ENCOUNTER — Ambulatory Visit (INDEPENDENT_AMBULATORY_CARE_PROVIDER_SITE_OTHER): Payer: Medicare HMO | Admitting: Physician Assistant

## 2017-05-02 ENCOUNTER — Encounter: Payer: Self-pay | Admitting: Physician Assistant

## 2017-05-02 VITALS — BP 144/72 | HR 80 | Temp 98.2°F | Resp 16 | Ht 65.0 in

## 2017-05-02 DIAGNOSIS — F410 Panic disorder [episodic paroxysmal anxiety] without agoraphobia: Secondary | ICD-10-CM | POA: Diagnosis not present

## 2017-05-02 DIAGNOSIS — I10 Essential (primary) hypertension: Secondary | ICD-10-CM | POA: Diagnosis not present

## 2017-05-02 DIAGNOSIS — N183 Chronic kidney disease, stage 3 unspecified: Secondary | ICD-10-CM

## 2017-05-02 DIAGNOSIS — E7800 Pure hypercholesterolemia, unspecified: Secondary | ICD-10-CM

## 2017-05-02 DIAGNOSIS — E119 Type 2 diabetes mellitus without complications: Secondary | ICD-10-CM

## 2017-05-02 DIAGNOSIS — F329 Major depressive disorder, single episode, unspecified: Secondary | ICD-10-CM

## 2017-05-02 DIAGNOSIS — F419 Anxiety disorder, unspecified: Secondary | ICD-10-CM | POA: Diagnosis not present

## 2017-05-02 DIAGNOSIS — G4700 Insomnia, unspecified: Secondary | ICD-10-CM

## 2017-05-02 DIAGNOSIS — F32A Depression, unspecified: Secondary | ICD-10-CM

## 2017-05-02 MED ORDER — PROMETHAZINE HCL 12.5 MG PO TABS: 12.5000 mg | ORAL_TABLET | Freq: Three times a day (TID) | ORAL | 0 refills | Status: DC | PRN

## 2017-05-02 MED ORDER — ALPRAZOLAM 0.5 MG PO TABS: 0.5000 mg | ORAL_TABLET | Freq: Three times a day (TID) | ORAL | 0 refills | Status: AC | PRN

## 2017-05-02 NOTE — Progress Notes (Signed)
Patient ID: [name] MRN: [id number], DOB: 1945-04-08, 72 y.o. Date of Encounter: @DATE @  Chief Complaint:  Chief Complaint  Patient presents with  . Bloated  . Shaking  . Medication Refill    phenergan     HPI: 72 y.o. year old female  presents with above.   01/24/2017: Today I reviewed that her last routine visit with me was 12/07/2015. Today she reports that she does continue to take her Actos for her diabetes.  Is having no adverse effects with this. Today she reports that she is continuing to take the lisinopril daily.  This is causing no lightheadedness or other adverse effects. She reports that the Lipitor ran out about 2 months ago.  This was causing no myalgias or other adverse effects. She reports that she has been out of Effexor for about 2 weeks.  She has been off of Wellbutrin for quite a while.  She is feeling like she needs to get back on both of these.  Reports that over the last couple weeks since being off of medication she has not been wanting to go anywhere.  Says that she just wants to stay at home by herself.  Also gets teary and cries easily.  Says that she "wants to feel like a normal person again.  Wants to be in a good mood ".  Says that some mornings when she wakes up feels like she has a hangover and this has just started happening over the last couple weeks. No other specific concerns to address today.  A/P AT THAT OV: BP controlled on lisinopril so that was continued the same. She has been out of Lipitor for about 2 months.   I did go ahead and send a refill of this today.   Will go ahead and check lab.  She had 2 small cups of coffee about 3 hours prior to these labs.  In the coffee had 2% milk and Splenda.  Otherwise is fasting.--checked cmet, flp For Anxiety, Depression--- Restarted the Effexor and Wellbutrin    05/02/2017: Today she reports that she was feeling a lot better in regards to getting back on the Effexor and Wellbutrin.  Says that  she was feeling like she had a lot more energy and was doing a lot more.  Getting up and doing laundry and the dishes and sweeping the floor etc. Liver about 3-4 weeks ago found out that her sister-in-law has metastatic cancer.  States that the sister-in-law and her brother are moving to [place] so their kids can help take care of her.  States that they are moving in 1 week.  Patient is teary and appears nervous and anxious about this.  Says that she and her sister-in-law usually get together and play cards and chat etc.  Also adds "I know my brother will never moved back here once he gets moved to [place].  "Asked if she has other family members close by other close support.  Is that she has 2 sons but she does not talk to them.  Is 1 of her son she has not talked to it off for 3 years.  Says that the other son she does talk to about once a month but he is in [place] and has a business there and is busy with that.  Says that she does have one neighbor that she talks to.  Names off one friend who passed away last year.  Asked about sleep and whether this is causing  insomnia.  However she states that she is in the same sleep routine sleep pattern as she has been for years.  He goes to bed at 10 PM and has no problems falling asleep.  When she is at home she stays busy playing games on the computer or doing something to keep her mind busy in the being here talking about it has her more upset.  Reviewed labs from 01/24/17 showed LDL 218 and that she was to restart Lipitor 80 mg.  Today she states that she did restart the Lipitor 80 mg.  Is taking this daily. Today she reports that she does continue to take her Actos for her diabetes.  Is having no adverse effects with this. Today she reports that she is continuing to take the lisinopril daily.  This is causing no lightheadedness or other adverse effects.   Past Medical History:  Diagnosis Date  . Allergy   . Anxiety   . Arthritis   . Asthma   .  Bursitis   . Cataract   . COPD (chronic obstructive pulmonary disease) ([place])   . Depression   . Diabetes mellitus   . Hiatal hernia   . Hypercholesteremia   . Hypertension   . Shortness of breath    chronic brochitis  . Ulcer      Home Meds: Outpatient Medications Prior to Visit  Medication Sig Dispense Refill  . ACCU-CHEK AVIVA PLUS test strip TESTING TWICE DAILY. 100 each PRN  . atorvastatin (LIPITOR) 80 MG tablet TAKE (1) TABLET BY MOUTH ONCE DAILY. 90 tablet 0  . buPROPion (WELLBUTRIN SR) 150 MG 12 hr tablet TAKE (1) TABLET BY MOUTH TWICE DAILY. 180 tablet 0  . lisinopril (PRINIVIL,ZESTRIL) 40 MG tablet TAKE (1) TABLET BY MOUTH ONCE DAILY. (Patient taking differently: TAKE (1) TABLET BY MOUTH ONCE DAILY IN THE EVENING) 90 tablet 0  . lisinopril (PRINIVIL,ZESTRIL) 40 MG tablet TAKE (1) TABLET BY MOUTH ONCE DAILY. 90 tablet 3  . montelukast (SINGULAIR) 10 MG tablet TAKE 1 TABLET BY MOUTH ONCE DAILY AS NEEDED FOR ALLERGIES. (Patient taking differently: TAKE 1 TABLET BY MOUTH ONCE IN THE EVENING) 90 tablet 0  . pioglitazone (ACTOS) 15 MG tablet TAKE (1) TABLET BY MOUTH ONCE DAILY. 90 tablet 0  . Polyethyl Glycol-Propyl Glycol (SYSTANE OP) Apply 1 drop to eye 3 (three) times daily.    . SYMBICORT 160-4.5 MCG/ACT inhaler INHALE 2 PUFFS INTO LUNGS TWICE DAILY. 10.2 g 0  . traZODone (DESYREL) 50 MG tablet TAKE 3 TABLETS BY MOUTH AT BEDTIME AS NEEDED FOR SLEEP. 270 tablet 0  . venlafaxine XR (EFFEXOR-XR) 75 MG 24 hr capsule Take 2 capsules (150 mg total) by mouth daily. 180 capsule 0  . VENTOLIN HFA 108 (90 Base) MCG/ACT inhaler INHALE 2 PUFFS EVERY 4 TO 6 HOURS AS NEEDED FOR WHEEZING AND SHORTNESS OF BREATH. 18 g 4  . promethazine (PHENERGAN) 12.5 MG tablet Take 1 tablet (12.5 mg total) by mouth every 8 (eight) hours as needed for nausea or vomiting. 30 tablet 0  . traZODone (DESYREL) 50 MG tablet TAKE 3 TABLETS BY MOUTH AT BEDTIME AS NEEDED FOR SLEEP. 270 tablet 0   No  facility-administered medications prior to visit.     Allergies:  Allergies  Allergen Reactions  . Codeine Itching  . Flonase [Fluticasone Propionate] Cough    Severe coughing  . Metformin And Related Diarrhea    Caused severe diarrhea and severe GI symptoms---even on low dose 500mg  BID    Social  History   Socioeconomic History  . Marital status: Single    Spouse name: Not on file  . Number of children: Not on file  . Years of education: Not on file  . Highest education level: Not on file  Occupational History  . Not on file  Social Needs  . Financial resource strain: Not on file  . Food insecurity:    Worry: Not on file    Inability: Not on file  . Transportation needs:    Medical: Not on file    Non-medical: Not on file  Tobacco Use  . Smoking status: Current Every Day Smoker    Packs/day: 0.50    Years: 52.00    Pack years: 26.00    Types: Cigarettes  . Smokeless tobacco: Never Used  Substance and Sexual Activity  . Alcohol use: Yes    Comment: rare  . Drug use: Yes    Frequency: 3.0 times per week    Types: Marijuana    Comment: 1-3 times week  . Sexual activity: Not Currently    Birth control/protection: Surgical  Lifestyle  . Physical activity:    Days per week: Not on file    Minutes per session: Not on file  . Stress: Not on file  Relationships  . Social connections:    Talks on phone: Not on file    Gets together: Not on file    Attends religious service: Not on file    Active member of club or organization: Not on file    Attends meetings of clubs or organizations: Not on file    Relationship status: Not on file  . Intimate partner violence:    Fear of current or ex partner: Not on file    Emotionally abused: Not on file    Physically abused: Not on file    Forced sexual activity: Not on file  Other Topics Concern  . Not on file  Social History Narrative  . Not on file    Family History  Problem Relation Age of Onset  . Breast cancer  Daughter   . Heart disease Mother   . Hyperlipidemia Mother   . Hyperlipidemia Father   . Cancer Sister   . Birth defects Grandchild   . Anesthesia problems Neg Hx   . Hypotension Neg Hx   . Malignant hyperthermia Neg Hx   . Pseudochol deficiency Neg Hx   . Colon cancer Neg Hx      Review of Systems:  See HPI for pertinent ROS. All other ROS negative.    Physical Exam: Blood pressure (!) 144/72, pulse 80, temperature 98.2 F (36.8 C), temperature source Oral, resp. rate 16, height 5\' 5"  (1.651 m), SpO2 98 %., Body mass index is 33.35 kg/m. General: Overweight WF. Appears in no acute distress. Neck: Supple. No thyromegaly. No lymphadenopathy.  No carotid bruits. Lungs: Clear bilaterally to auscultation without wheezes, rales, or rhonchi. Breathing is unlabored. Heart: RRR with S1 S2. No murmurs, rubs, or gallops. Abdomen: Soft, non-tender, non-distended with normoactive bowel sounds. No hepatomegaly. No rebound/guarding. No obvious abdominal masses. Musculoskeletal:  Strength and tone normal for age. Extremities/Skin: Warm and dry.  No edema.  Diabetic foot exam: Inspection is normal.  There are no wounds or problem areas. Neuro: Alert and oriented X 3. Moves all extremities spontaneously. Gait is normal. CNII-XII grossly in tact. Psych:  Today she does have tears as she talks about her brother and sister-in-law moving.      ASSESSMENT AND  PLAN:  72 y.o. year old female with   Essential hypertension 05/02/2017:  Blood pressure is at goal.  Continue current medication.    Pulmonary emphysema, unspecified emphysema type ([place]) 05/02/2017:  This is stable on current medication.  She is on Symbicort as maintenance medication and has albuterol available to use if needed.  Diabetes mellitus without complication ([place]) 8/91/6945: Check A1c to monitor.  Chronic kidney disease (CKD), stage III (moderate) ([place]) 05/02/2017:  She is on ACE inhibitor for renal protection.  Checking labs  every 6 months to monitor.  Hypercholesteremia 05/02/2017:   At visit/labs 01/24/17 she had been out of Lipitor for 2 months.  LDL was 218.  At that time she was to resume Lipitor 80 mg.   --Today she reports that she did resume the Lipitor 80 mg and is taking this daily.   --She is to come fasting for her next visit so we can recheck FLP/LFT on Lipitor 80 mg.   Anxiety, Depression 05/02/2017:  She is on both Effexor and Wellbutrin now.  ------- discussed if she feels that doing some type of counseling/therapy would help or some type of grief support group-- but she defers. -------Discussed benzodiazepines do cause physical dependence and addiction but she feels that she really needs something to calm her nerves as needed.  Discussed that I will give her Xanax to use but to use this sparingly she voices understanding and agrees.  Today but gave her prescription for Xanax 0.5 mg #30+0.  Plan follow-up routine visit in 3 months.  She is to come fasting to that visit.  Follow-up sooner if needed.   [address] [place], [place], [facility] 05/02/2017 4:33 PM

## 2017-05-03 LAB — HEMOGLOBIN A1C
EAG (MMOL/L): 6.6 (calc)
Hgb A1c MFr Bld: 5.8 % of total Hgb — ABNORMAL HIGH (ref <5.7)
Mean Plasma Glucose: 120 (calc)

## 2017-05-06 ENCOUNTER — Other Ambulatory Visit: Payer: Medicare HMO

## 2017-05-09 ENCOUNTER — Encounter: Payer: Self-pay | Admitting: *Deleted

## 2017-05-30 ENCOUNTER — Other Ambulatory Visit: Payer: Self-pay | Admitting: Family Medicine

## 2017-06-25 ENCOUNTER — Other Ambulatory Visit: Payer: Self-pay | Admitting: Physician Assistant

## 2017-07-17 ENCOUNTER — Other Ambulatory Visit: Payer: Self-pay | Admitting: Family Medicine

## 2017-08-12 ENCOUNTER — Ambulatory Visit (INDEPENDENT_AMBULATORY_CARE_PROVIDER_SITE_OTHER): Payer: Medicare HMO | Admitting: Family Medicine

## 2017-08-12 ENCOUNTER — Encounter: Payer: Self-pay | Admitting: Family Medicine

## 2017-08-12 VITALS — BP 110/68 | HR 80 | Temp 98.1°F | Resp 18 | Ht 65.0 in | Wt 207.0 lb

## 2017-08-12 DIAGNOSIS — J019 Acute sinusitis, unspecified: Secondary | ICD-10-CM

## 2017-08-12 MED ORDER — FLUTICASONE PROPIONATE 50 MCG/ACT NA SUSP: 2.0000 | Freq: Every day | NASAL | 6 refills | Status: DC

## 2017-08-12 MED ORDER — AMOXICILLIN 875 MG PO TABS: 875.0000 mg | ORAL_TABLET | Freq: Two times a day (BID) | ORAL | 0 refills | Status: DC

## 2017-08-12 NOTE — Progress Notes (Signed)
Subjective:    Patient ID: [name], female    DOB: 19-Jun-1945, 72 y.o.   MRN: [id number]  HPI Patient reports a one-week history of severe head congestion.  She has pain and pressure in the frontal and maxillary sinuses.  She has pain to percussion over the left maxillary and left frontal sinus area.  She reports no ability to breathe through her nostrils.  Her nose is extremely congested.  She also reports feeling weak and tired.  She reports a dull headache.  She also reports some dizziness with head turning which sounds like vertigo or possibly disequilibrium related to sinus pressure.  She also reports pressure and pain in both ears although her tympanic membranes are pearly gray today on exam.  She denies any cough.  She denies any sore throat. Past Medical History:  Diagnosis Date  . Allergy   . Anxiety   . Arthritis   . Asthma   . Bursitis   . Cataract   . COPD (chronic obstructive pulmonary disease) ([place])   . Depression   . Diabetes mellitus   . Hiatal hernia   . Hypercholesteremia   . Hypertension   . Shortness of breath    chronic brochitis  . Ulcer    Past Surgical History:  Procedure Laterality Date  . ABDOMINAL HYSTERECTOMY    . ANKLE ARTHROSCOPY WITH DRILLING/MICROFRACTURE Left 10/12/2015   Procedure: LEFT ANKLE ARTHROSCOPY, REMOVAL OF OSTEOCHONDRAL LESION WITH MICROFRACTURE;  Surgeon: Trula Slade, DPM;  Location: [facility];  Service: Podiatry;  Laterality: Left;  . BIOPSY  10/24/2016   Procedure: BIOPSY;  Surgeon: Daneil Dolin, MD;  [facility];  Service: Endoscopy;;  gastric esophageal  . CATARACT EXTRACTION W/PHACO  10/16/2010   Procedure: CATARACT EXTRACTION PHACO AND INTRAOCULAR LENS PLACEMENT ([place]);  Surgeon: Tonny Branch;  [facility]: [facility];  Service: Ophthalmology;  Laterality: Left;  CDE: 6.99  . CATARACT EXTRACTION W/PHACO  12/18/2010   Procedure: CATARACT EXTRACTION PHACO AND INTRAOCULAR LENS PLACEMENT ([facility]);   Surgeon: Tonny Branch;  [facility]: [facility];  Service: Ophthalmology;  Laterality: Right;  CDE 12.50  . COLONOSCOPY     about 5 years ago/[facility]  . COLONOSCOPY WITH PROPOFOL N/A 10/24/2016   Procedure: COLONOSCOPY WITH PROPOFOL;  Surgeon: Daneil Dolin, MD;  [facility];  Service: Endoscopy;  Laterality: N/A;  215  . ESOPHAGOGASTRODUODENOSCOPY  2003   Gastritis, path unavailable.  . ESOPHAGOGASTRODUODENOSCOPY (EGD) WITH PROPOFOL N/A 10/24/2016   Procedure: ESOPHAGOGASTRODUODENOSCOPY (EGD) WITH PROPOFOL;  Surgeon: Daneil Dolin, MD;  [facility];  Service: Endoscopy;  Laterality: N/A;  . EYE SURGERY    . fracture lower back    . left elbow tendon repair    . NEUROPLASTY / TRANSPOSITION MEDIAN NERVE AT CARPAL TUNNEL BILATERAL    . plantar fasc bilateral    . right rotator cuff    . rt foot reconstruction    . trigger thumb     Left   Current Outpatient Medications on File Prior to Visit  Medication Sig Dispense Refill  . ACCU-CHEK AVIVA PLUS test strip TESTING TWICE DAILY. 100 each PRN  . ALPRAZolam (XANAX) 0.5 MG tablet Take 1 tablet (0.5 mg total) by mouth 3 (three) times daily as needed for sleep or anxiety. 30 tablet 0  . atorvastatin (LIPITOR) 80 MG tablet TAKE (1) TABLET BY MOUTH ONCE DAILY. 90 tablet 0  . buPROPion (WELLBUTRIN SR) 150 MG 12 hr tablet TAKE (1)  TABLET BY MOUTH TWICE DAILY. 180 tablet 0  . lisinopril (PRINIVIL,ZESTRIL) 40 MG tablet TAKE (1) TABLET BY MOUTH ONCE DAILY. 90 tablet 3  . montelukast (SINGULAIR) 10 MG tablet TAKE 1 TABLET BY MOUTH ONCE DAILY AS NEEDED FOR ALLERGIES. (Patient taking differently: TAKE 1 TABLET BY MOUTH ONCE IN THE EVENING) 90 tablet 0  . Polyethyl Glycol-Propyl Glycol (SYSTANE OP) Apply 1 drop to eye 3 (three) times daily.    . promethazine (PHENERGAN) 12.5 MG tablet Take 1 tablet (12.5 mg total) by mouth every 8 (eight) hours as needed for nausea or vomiting. 30 tablet 0  . traZODone (DESYREL) 50 MG tablet TAKE 3 TABLETS BY  MOUTH AT BEDTIME AS NEEDED FOR SLEEP. 270 tablet 0  . traZODone (DESYREL) 50 MG tablet TAKE 3 TABLETS BY MOUTH AT BEDTIME AS NEEDED FOR SLEEP. 270 tablet 0  . venlafaxine XR (EFFEXOR-XR) 75 MG 24 hr capsule TAKE 2 CAPSULES BY MOUTH DAILY. 180 capsule 0  . VENTOLIN HFA 108 (90 Base) MCG/ACT inhaler INHALE 2 PUFFS EVERY 4 TO 6 HOURS AS NEEDED FOR WHEEZING AND SHORTNESS OF BREATH. 18 g 4  . pioglitazone (ACTOS) 15 MG tablet TAKE (1) TABLET BY MOUTH ONCE DAILY. (Patient not taking: Reported on 08/12/2017) 90 tablet 0  . SYMBICORT 160-4.5 MCG/ACT inhaler INHALE 2 PUFFS INTO LUNGS TWICE DAILY. (Patient not taking: Reported on 08/12/2017) 10.2 g 0   No current facility-administered medications on file prior to visit.    Allergies  Allergen Reactions  . Codeine Itching  . Flonase [Fluticasone Propionate] Cough    Severe coughing  . Metformin And Related Diarrhea    Caused severe diarrhea and severe GI symptoms---even on low dose 500mg  BID   Social History   Socioeconomic History  . Marital status: Single    Spouse name: Not on file  . Number of children: Not on file  . Years of education: Not on file  . Highest education level: Not on file  Occupational History  . Not on file  Social Needs  . Financial resource strain: Not on file  . Food insecurity:    Worry: Not on file    Inability: Not on file  . Transportation needs:    Medical: Not on file    Non-medical: Not on file  Tobacco Use  . Smoking status: Current Every Day Smoker    Packs/day: 0.50    Years: 52.00    Pack years: 26.00    Types: Cigarettes  . Smokeless tobacco: Never Used  Substance and Sexual Activity  . Alcohol use: Yes    Comment: rare  . Drug use: Yes    Frequency: 3.0 times per week    Types: Marijuana    Comment: 1-3 times week  . Sexual activity: Not Currently    Birth control/protection: Surgical  Lifestyle  . Physical activity:    Days per week: Not on file    Minutes per session: Not on file  .  Stress: Not on file  Relationships  . Social connections:    Talks on phone: Not on file    Gets together: Not on file    Attends religious service: Not on file    Active member of club or organization: Not on file    Attends meetings of clubs or organizations: Not on file    Relationship status: Not on file  . Intimate partner violence:    Fear of current or ex partner: Not on file    Emotionally abused:  Not on file    Physically abused: Not on file    Forced sexual activity: Not on file  Other Topics Concern  . Not on file  Social History Narrative  . Not on file      Review of Systems  All other systems reviewed and are negative.      Objective:   Physical Exam  Constitutional: She appears well-developed and well-nourished. No distress.  HENT:  Right Ear: Tympanic membrane, external ear and ear canal normal.  Left Ear: Tympanic membrane, external ear and ear canal normal.  Nose: Mucosal edema and rhinorrhea present. Left sinus exhibits maxillary sinus tenderness and frontal sinus tenderness.  Mouth/Throat: Oropharynx is clear and moist. No oropharyngeal exudate.  Eyes: Pupils are equal, round, and reactive to light. Conjunctivae and EOM are normal.  Neck: No thyromegaly present.  Cardiovascular: Normal rate, regular rhythm and normal heart sounds.  Pulmonary/Chest: Effort normal. She has wheezes.  Lymphadenopathy:    She has no cervical adenopathy.  Skin: She is not diaphoretic.  Vitals reviewed.         Assessment & Plan:  Acute rhinosinusitis  I believe the patient has a sinus infection causing the pain and pressure in her left frontal and maxillary sinuses.  I believe this is causing her head congestion a week this may be contributing to her dizziness.  She may also have a mild case of vertigo.  I will treat the sinus infection first and see what symptoms persist over the next week.  Begin bacillin 875 mg p.o. twice daily for 10 days with Flonase 2 sprays  each nostril daily.  Recheck in 1 week if no better or sooner if worse

## 2017-08-20 ENCOUNTER — Other Ambulatory Visit: Payer: Self-pay | Admitting: Family Medicine

## 2017-08-22 ENCOUNTER — Other Ambulatory Visit: Payer: Self-pay

## 2017-08-22 MED ORDER — TRAZODONE HCL 50 MG PO TABS
ORAL_TABLET | ORAL | 0 refills | Status: DC
Start: 2017-08-22 — End: 2018-01-06

## 2017-08-22 MED ORDER — VENLAFAXINE HCL ER 75 MG PO CP24: 150.0000 mg | ORAL_CAPSULE | Freq: Every day | ORAL | 0 refills | Status: DC

## 2017-08-22 MED ORDER — ATORVASTATIN CALCIUM 80 MG PO TABS: ORAL_TABLET | ORAL | 0 refills | Status: DC

## 2017-08-22 MED ORDER — LISINOPRIL 40 MG PO TABS: ORAL_TABLET | ORAL | 3 refills | Status: DC

## 2017-09-21 ENCOUNTER — Other Ambulatory Visit: Payer: Self-pay | Admitting: Physician Assistant

## 2017-10-29 ENCOUNTER — Other Ambulatory Visit: Payer: Self-pay | Admitting: Physician Assistant

## 2017-11-18 ENCOUNTER — Other Ambulatory Visit: Payer: Self-pay | Admitting: Physician Assistant

## 2017-11-21 ENCOUNTER — Other Ambulatory Visit: Payer: Self-pay | Admitting: Family Medicine

## 2017-11-21 ENCOUNTER — Other Ambulatory Visit: Payer: Self-pay | Admitting: Physician Assistant

## 2017-11-21 DIAGNOSIS — J209 Acute bronchitis, unspecified: Secondary | ICD-10-CM

## 2017-12-30 ENCOUNTER — Other Ambulatory Visit: Payer: Self-pay

## 2017-12-30 MED ORDER — BUPROPION HCL ER (SR) 150 MG PO TB12: ORAL_TABLET | ORAL | 0 refills | Status: DC

## 2017-12-31 ENCOUNTER — Encounter: Payer: Medicare HMO | Admitting: Family Medicine

## 2018-01-06 ENCOUNTER — Other Ambulatory Visit: Payer: Self-pay | Admitting: *Deleted

## 2018-01-06 MED ORDER — TRAZODONE HCL 50 MG PO TABS: ORAL_TABLET | ORAL | 0 refills | Status: DC

## 2018-01-08 ENCOUNTER — Other Ambulatory Visit: Payer: Self-pay | Admitting: Family Medicine

## 2018-01-29 ENCOUNTER — Other Ambulatory Visit: Payer: Self-pay | Admitting: Family Medicine

## 2018-02-05 ENCOUNTER — Other Ambulatory Visit: Payer: Self-pay | Admitting: Family Medicine

## 2018-02-05 DIAGNOSIS — J209 Acute bronchitis, unspecified: Secondary | ICD-10-CM

## 2018-03-10 ENCOUNTER — Other Ambulatory Visit: Payer: Self-pay | Admitting: Family Medicine

## 2018-03-11 ENCOUNTER — Other Ambulatory Visit: Payer: Self-pay | Admitting: Family Medicine

## 2018-05-14 ENCOUNTER — Other Ambulatory Visit: Payer: Self-pay | Admitting: Family Medicine

## 2018-05-15 NOTE — Telephone Encounter (Signed)
Requested Prescriptions   Pending Prescriptions Disp Refills  . traZODone (DESYREL) 50 MG tablet [Pharmacy Med Name: TRAZODONE HYDROCHLORIDE 50 MG Tablet] 270 tablet 0    Sig: TAKE 3 TABLETS BY MOUTH AT BEDTIME AS NEEDED FOR SLEEP.   Last OV 08/12/2017  Last filled 03/10/2018

## 2018-06-12 ENCOUNTER — Encounter: Payer: Self-pay | Admitting: Family Medicine

## 2018-06-12 ENCOUNTER — Other Ambulatory Visit: Payer: Self-pay

## 2018-06-12 ENCOUNTER — Ambulatory Visit (INDEPENDENT_AMBULATORY_CARE_PROVIDER_SITE_OTHER): Payer: Medicare HMO | Admitting: Family Medicine

## 2018-06-12 VITALS — BP 146/68 | HR 94 | Temp 98.1°F | Resp 18 | Ht 65.0 in | Wt 226.0 lb

## 2018-06-12 DIAGNOSIS — R0781 Pleurodynia: Secondary | ICD-10-CM | POA: Diagnosis not present

## 2018-06-12 MED ORDER — HYDROCODONE-ACETAMINOPHEN 7.5-325 MG PO TABS: 1.0000 | ORAL_TABLET | Freq: Four times a day (QID) | ORAL | 0 refills | Status: DC | PRN

## 2018-06-12 NOTE — Progress Notes (Signed)
Subjective:    Patient ID: [name], female    DOB: Jul 26, 1945, 73 y.o.   MRN: [id number]  HPI Patient was in her normal state of health until approximately 11 days ago.  She slipped and fell in her bathroom and landed on her left side.  She has had pain in her ribs on that side ever since.  She is concerned because the pain seems to be getting worse not better.  She is extremely tender to palpation on the left ribs just below her left breast anteriorly.  She has sharp pain whenever I press her in that area directly over the bones.  She also has some slight pain when I press on the ribs in the mid axillary line.  She denies any hemoptysis.  She denies any cough or fever.  She denies any shortness of breath but she does report pleurisy whenever she takes a deep inspiration and severe pain whenever she tries to cough.  She denies any hematochezia.  She denies any melena.  She denies any hematuria.  She denies any pain with palpation of her abdomen in the left lower quadrant, right lower quadrant, or right upper quadrant.  She is mildly tender to palpation in the left upper quadrant but jumps with severe pain whenever I press over the rib.  She denies any angina.  She denies any shortness of breath.  She denies any nausea or vomiting. Past Medical History:  Diagnosis Date  . Allergy   . Anxiety   . Arthritis   . Asthma   . Bursitis   . Cataract   . COPD (chronic obstructive pulmonary disease) ([place])   . Depression   . Diabetes mellitus   . Hiatal hernia   . Hypercholesteremia   . Hypertension   . Shortness of breath    chronic brochitis  . Ulcer    Past Surgical History:  Procedure Laterality Date  . ABDOMINAL HYSTERECTOMY    . ANKLE ARTHROSCOPY WITH DRILLING/MICROFRACTURE Left 10/12/2015   Procedure: LEFT ANKLE ARTHROSCOPY, REMOVAL OF OSTEOCHONDRAL LESION WITH MICROFRACTURE;  Surgeon: Trula Slade, DPM;  Location: [facility];  Service: Podiatry;  Laterality:  Left;  . BIOPSY  10/24/2016   Procedure: BIOPSY;  Surgeon: Daneil Dolin, MD;  [facility];  Service: Endoscopy;;  gastric esophageal  . CATARACT EXTRACTION W/PHACO  10/16/2010   Procedure: CATARACT EXTRACTION PHACO AND INTRAOCULAR LENS PLACEMENT ([place]);  Surgeon: Tonny Branch;  [facility];  Service: Ophthalmology;  Laterality: Left;  CDE: 6.99  . CATARACT EXTRACTION W/PHACO  12/18/2010   Procedure: CATARACT EXTRACTION PHACO AND INTRAOCULAR LENS PLACEMENT ([facility]);  Surgeon: Tonny Branch;  [facility];  Service: Ophthalmology;  Laterality: Right;  CDE 12.50  . COLONOSCOPY     about 5 years ago/[facility]  . COLONOSCOPY WITH PROPOFOL N/A 10/24/2016   Procedure: COLONOSCOPY WITH PROPOFOL;  Surgeon: Daneil Dolin, MD;  [facility];  Service: Endoscopy;  Laterality: N/A;  215  . ESOPHAGOGASTRODUODENOSCOPY  2003   Gastritis, path unavailable.  . ESOPHAGOGASTRODUODENOSCOPY (EGD) WITH PROPOFOL N/A 10/24/2016   Procedure: ESOPHAGOGASTRODUODENOSCOPY (EGD) WITH PROPOFOL;  Surgeon: Daneil Dolin, MD;  [facility];  Service: Endoscopy;  Laterality: N/A;  . EYE SURGERY    . fracture lower back    . left elbow tendon repair    . NEUROPLASTY / TRANSPOSITION MEDIAN NERVE AT CARPAL TUNNEL BILATERAL    . plantar fasc bilateral    . right  rotator cuff    . rt foot reconstruction    . trigger thumb     Left   Current Outpatient Medications on File Prior to Visit  Medication Sig Dispense Refill  . ACCU-CHEK AVIVA PLUS test strip TESTING TWICE DAILY. 100 each PRN  . amoxicillin (AMOXIL) 875 MG tablet Take 1 tablet (875 mg total) by mouth 2 (two) times daily. 20 tablet 0  . atorvastatin (LIPITOR) 80 MG tablet TAKE 1 TABLET EVERY DAY 90 tablet 0  . buPROPion (WELLBUTRIN SR) 150 MG 12 hr tablet TAKE (1) TABLET BY MOUTH TWICE DAILY. 180 tablet 1  . fluticasone (FLONASE) 50 MCG/ACT nasal spray USE 2 SPRAYS IN EACH NOSTRIL EVERY DAY 48 g 3  . lisinopril (PRINIVIL,ZESTRIL) 40  MG tablet TAKE (1) TABLET BY MOUTH ONCE DAILY. 90 tablet 3  . montelukast (SINGULAIR) 10 MG tablet TAKE 1 TABLET BY MOUTH ONCE DAILY AS NEEDED FOR ALLERGIES. (Patient taking differently: TAKE 1 TABLET BY MOUTH ONCE IN THE EVENING) 90 tablet 0  . pioglitazone (ACTOS) 15 MG tablet TAKE (1) TABLET BY MOUTH ONCE DAILY. (Patient not taking: Reported on 08/12/2017) 90 tablet 0  . Polyethyl Glycol-Propyl Glycol (SYSTANE OP) Apply 1 drop to eye 3 (three) times daily.    . promethazine (PHENERGAN) 12.5 MG tablet Take 1 tablet (12.5 mg total) by mouth every 8 (eight) hours as needed for nausea or vomiting. 30 tablet 0  . SYMBICORT 160-4.5 MCG/ACT inhaler INHALE 2 PUFFS INTO THE LUNGS TWICE DAILY. 10.2 g 5  . traZODone (DESYREL) 50 MG tablet TAKE 3 TABLETS BY MOUTH AT BEDTIME AS NEEDED FOR SLEEP. 270 tablet 0  . venlafaxine XR (EFFEXOR-XR) 75 MG 24 hr capsule TAKE 2 CAPSULES EVERY DAY 180 capsule 0  . VENTOLIN HFA 108 (90 Base) MCG/ACT inhaler INHALE 2 PUFFS EVERY 4 TO 6 HOURS AS NEEDED FOR WHEEZING AND SHORTNESS OF BREATH. 18 g 5   No current facility-administered medications on file prior to visit.    Allergies  Allergen Reactions  . Codeine Itching  . Flonase [Fluticasone Propionate] Cough    Severe coughing  . Metformin And Related Diarrhea    Caused severe diarrhea and severe GI symptoms---even on low dose 500mg  BID   Social History   Socioeconomic History  . Marital status: Single    Spouse name: Not on file  . Number of children: Not on file  . Years of education: Not on file  . Highest education level: Not on file  Occupational History  . Not on file  Social Needs  . Financial resource strain: Not on file  . Food insecurity:    Worry: Not on file    Inability: Not on file  . Transportation needs:    Medical: Not on file    Non-medical: Not on file  Tobacco Use  . Smoking status: Current Every Day Smoker    Packs/day: 0.50    Years: 52.00    Pack years: 26.00    Types:  Cigarettes  . Smokeless tobacco: Never Used  Substance and Sexual Activity  . Alcohol use: Yes    Comment: rare  . Drug use: Yes    Frequency: 3.0 times per week    Types: Marijuana    Comment: 1-3 times week  . Sexual activity: Not Currently    Birth control/protection: Surgical  Lifestyle  . Physical activity:    Days per week: Not on file    Minutes per session: Not on file  . Stress:  Not on file  Relationships  . Social connections:    Talks on phone: Not on file    Gets together: Not on file    Attends religious service: Not on file    Active member of club or organization: Not on file    Attends meetings of clubs or organizations: Not on file    Relationship status: Not on file  . Intimate partner violence:    Fear of current or ex partner: Not on file    Emotionally abused: Not on file    Physically abused: Not on file    Forced sexual activity: Not on file  Other Topics Concern  . Not on file  Social History Narrative  . Not on file      Review of Systems  All other systems reviewed and are negative.      Objective:   Physical Exam Vitals signs reviewed.  Constitutional:      General: She is not in acute distress.    Appearance: She is well-developed. She is not diaphoretic.  HENT:     Right Ear: Tympanic membrane, ear canal and external ear normal.     Left Ear: Tympanic membrane, ear canal and external ear normal.     Mouth/Throat:     Pharynx: No oropharyngeal exudate.  Eyes:     Conjunctiva/sclera: Conjunctivae normal.     Pupils: Pupils are equal, round, and reactive to light.  Neck:     Thyroid: No thyromegaly.  Cardiovascular:     Rate and Rhythm: Normal rate and regular rhythm.     Heart sounds: Normal heart sounds. No murmur.  Pulmonary:     Effort: Pulmonary effort is normal. No respiratory distress.     Breath sounds: No stridor. No wheezing, rhonchi or rales.  Chest:     Chest wall: Tenderness present. No lacerations, deformity or  swelling.    Abdominal:     General: Abdomen is flat. Bowel sounds are normal. There is no distension.     Palpations: Abdomen is soft. There is no mass.     Tenderness: There is no abdominal tenderness. There is no guarding.  Lymphadenopathy:     Cervical: No cervical adenopathy.           Assessment & Plan:  Rib pain on left side  I believe the patient likely has bruised or fractured ribs on the left side.  She denies any red flag symptoms such as shortness of breath, hemoptysis, fever, or cough.  Pain seems to be superficial and muscular in nature.  Therefore I recommended symptomatic therapy.  I have recommended that she wear a rib belt for comfort.  She can use Norco 7.5/325 1 p.o. every 6 hours as needed pain.  I have recommended deep breathing exercises to help prevent like this is an infection.  If she develops shortness of breath or hemoptysis or severe abdominal pain or blood in her stool she is to seek medical attention immediately.  Otherwise I would expect the pain should gradually improve over the next 1 to 2 weeks.  Recheck immediately if symptoms worsen or change in any way.

## 2018-06-21 ENCOUNTER — Other Ambulatory Visit: Payer: Self-pay | Admitting: Family Medicine

## 2018-07-10 ENCOUNTER — Other Ambulatory Visit: Payer: Self-pay | Admitting: Family Medicine

## 2018-07-29 ENCOUNTER — Ambulatory Visit: Payer: Medicare HMO | Admitting: Family Medicine

## 2018-08-12 ENCOUNTER — Telehealth: Payer: Self-pay | Admitting: Family Medicine

## 2018-08-12 MED ORDER — METFORMIN HCL 500 MG PO TABS: 500.0000 mg | ORAL_TABLET | Freq: Two times a day (BID) | ORAL | 3 refills | Status: DC

## 2018-08-12 NOTE — Telephone Encounter (Signed)
Patient called in stating that she has been having high blood sugars the past two days. States yesterday she was in a store and she felt like she was going to pass out. States she then went home and checked her glucose and it was 238. This morning her fasting blood sugar was 181. She denies any other symptoms. She is scheduled for an office visit this Thursday but would like to know what you you recommend to get sugars down in the meantime. Please advise?

## 2018-08-12 NOTE — Telephone Encounter (Addendum)
Tried to call  - phone states "that the person you called is not available right now will have to try again later".   Medication sent to [facility]

## 2018-08-12 NOTE — Telephone Encounter (Signed)
Start metformin 500 bid

## 2018-08-13 ENCOUNTER — Other Ambulatory Visit: Payer: Self-pay

## 2018-08-13 MED ORDER — PIOGLITAZONE HCL 15 MG PO TABS: 15.0000 mg | ORAL_TABLET | Freq: Every day | ORAL | 1 refills | Status: DC

## 2018-08-13 MED ORDER — LISINOPRIL 40 MG PO TABS: ORAL_TABLET | ORAL | 3 refills | Status: DC

## 2018-08-13 MED ORDER — ACCU-CHEK AVIVA PLUS VI STRP: ORAL_STRIP | 99 refills | Status: DC

## 2018-08-13 NOTE — Addendum Note (Signed)
Addended by: Shary Decamp B on: 08/13/2018 04:37 PM   Modules accepted: Orders

## 2018-08-13 NOTE — Telephone Encounter (Signed)
"  the person you have called is not available please try your call again later"

## 2018-08-14 ENCOUNTER — Encounter: Payer: Self-pay | Admitting: Family Medicine

## 2018-08-14 ENCOUNTER — Ambulatory Visit (INDEPENDENT_AMBULATORY_CARE_PROVIDER_SITE_OTHER): Payer: Medicare HMO | Admitting: Family Medicine

## 2018-08-14 ENCOUNTER — Telehealth: Payer: Self-pay | Admitting: Family Medicine

## 2018-08-14 VITALS — BP 150/80 | HR 99 | Temp 98.1°F | Resp 18 | Ht 65.0 in | Wt 220.0 lb

## 2018-08-14 DIAGNOSIS — F17200 Nicotine dependence, unspecified, uncomplicated: Secondary | ICD-10-CM | POA: Diagnosis not present

## 2018-08-14 DIAGNOSIS — R9431 Abnormal electrocardiogram [ECG] [EKG]: Secondary | ICD-10-CM | POA: Diagnosis not present

## 2018-08-14 DIAGNOSIS — E7800 Pure hypercholesterolemia, unspecified: Secondary | ICD-10-CM

## 2018-08-14 DIAGNOSIS — E119 Type 2 diabetes mellitus without complications: Secondary | ICD-10-CM

## 2018-08-14 DIAGNOSIS — R0609 Other forms of dyspnea: Secondary | ICD-10-CM

## 2018-08-14 DIAGNOSIS — I10 Essential (primary) hypertension: Secondary | ICD-10-CM | POA: Diagnosis not present

## 2018-08-14 MED ORDER — METOPROLOL SUCCINATE ER 25 MG PO TB24: 25.0000 mg | ORAL_TABLET | Freq: Every day | ORAL | 3 refills | Status: DC

## 2018-08-14 NOTE — Telephone Encounter (Signed)
It was Toprol.  Why can't she take it?  I know she has COPD but I would like her to try it given her EKG changes.  I am worried she may have blockages in her heart.

## 2018-08-14 NOTE — Telephone Encounter (Signed)
Pt called at lunch and states that the medication pickard called in for her today she cannot take that med. Did not say why but wanted to know what else we would call in for her.   Please call.

## 2018-08-14 NOTE — Telephone Encounter (Signed)
Pt seen in ov today  

## 2018-08-14 NOTE — Progress Notes (Signed)
Subjective:    Patient ID: [name], female    DOB: [birth year], 73 y.o.   MRN: [id number]  HPI Patient originally made the appointment today because her blood sugars are out of control per her report.  She is seeing blood sugars between 150-200 for the last few days.  She is on no medication for her blood sugar.  She denies polyuria, polydipsia, or blurry vision.  However when I questioned the patient further, she states that she is extremely weak.  She reports excessive fatigue.  She states that she can barely walk around the grocery store.  She has to stop due to shortness of breath and fatigue.  Her exercise tolerance has declined suddenly and rapidly.  She also has been "getting more heartburn recently".  She points to the xiphoid or lower sternum as the location of the heartburn pain.  She does report a burning sensation at times in her throat.  However she also complains of pain sensation under the lower sternum that occurs randomly.  She denies any orthopnea.  She denies any paroxysmal nocturnal dyspnea.  She continues to smoke.  She denies any fevers or chills or hemoptysis. Past Medical History:  Diagnosis Date  . Allergy   . Anxiety   . Arthritis   . Asthma   . Bursitis   . Cataract   . COPD (chronic obstructive pulmonary disease) ([place])   . Depression   . Diabetes mellitus   . Hiatal hernia   . Hypercholesteremia   . Hypertension   . Shortness of breath    chronic brochitis  . Ulcer    Past Surgical History:  Procedure Laterality Date  . ABDOMINAL HYSTERECTOMY    . ANKLE ARTHROSCOPY WITH DRILLING/MICROFRACTURE Left 10/12/2015   Procedure: LEFT ANKLE ARTHROSCOPY, REMOVAL OF OSTEOCHONDRAL LESION WITH MICROFRACTURE;  Surgeon: Trula Slade, DPM;  Location: [place];  Service: Podiatry;  Laterality: Left;  . BIOPSY  10/24/2016   Procedure: BIOPSY;  Surgeon: Daneil Dolin, MD;  [facility];  Service: Endoscopy;;  gastric esophageal  .  CATARACT EXTRACTION W/PHACO  10/16/2010   Procedure: CATARACT EXTRACTION PHACO AND INTRAOCULAR LENS PLACEMENT ([place]);  Surgeon: Tonny Branch;  [facility];  Service: Ophthalmology;  Laterality: Left;  CDE: 6.99  . CATARACT EXTRACTION W/PHACO  12/18/2010   Procedure: CATARACT EXTRACTION PHACO AND INTRAOCULAR LENS PLACEMENT ([facility]);  Surgeon: Tonny Branch;  [facility];  Service: Ophthalmology;  Laterality: Right;  CDE 12.50  . COLONOSCOPY     about 5 years ago/[facility]  . COLONOSCOPY WITH PROPOFOL N/A 10/24/2016   Procedure: COLONOSCOPY WITH PROPOFOL;  Surgeon: Daneil Dolin, MD;  [facility];  Service: Endoscopy;  Laterality: N/A;  215  . ESOPHAGOGASTRODUODENOSCOPY  2003   Gastritis, path unavailable.  . ESOPHAGOGASTRODUODENOSCOPY (EGD) WITH PROPOFOL N/A 10/24/2016   Procedure: ESOPHAGOGASTRODUODENOSCOPY (EGD) WITH PROPOFOL;  Surgeon: Daneil Dolin, MD;  [facility];  Service: Endoscopy;  Laterality: N/A;  . EYE SURGERY    . fracture lower back    . left elbow tendon repair    . NEUROPLASTY / TRANSPOSITION MEDIAN NERVE AT CARPAL TUNNEL BILATERAL    . plantar fasc bilateral    . right rotator cuff    . rt foot reconstruction    . trigger thumb     Left   Current Outpatient Medications on File Prior to Visit  Medication Sig Dispense Refill  . atorvastatin (LIPITOR) 80 MG tablet TAKE  1 TABLET EVERY DAY 90 tablet 1  . buPROPion (WELLBUTRIN SR) 150 MG 12 hr tablet TAKE (1) TABLET BY MOUTH TWICE DAILY. 180 tablet 1  . fluticasone (FLONASE) 50 MCG/ACT nasal spray USE 2 SPRAYS IN EACH NOSTRIL EVERY DAY 48 g 3  . glucose blood (ACCU-CHEK AVIVA PLUS) test strip TESTING TWICE DAILY. 100 each PRN  . HYDROcodone-acetaminophen (NORCO) 7.5-325 MG tablet Take 1 tablet by mouth every 6 (six) hours as needed for moderate pain. 30 tablet 0  . lisinopril (ZESTRIL) 40 MG tablet TAKE (1) TABLET BY MOUTH ONCE DAILY. 90 tablet 3  . montelukast (SINGULAIR) 10 MG tablet TAKE 1 TABLET BY  MOUTH ONCE DAILY AS NEEDED FOR ALLERGIES. (Patient taking differently: TAKE 1 TABLET BY MOUTH ONCE IN THE EVENING) 90 tablet 0  . Polyethyl Glycol-Propyl Glycol (SYSTANE OP) Apply 1 drop to eye 3 (three) times daily.    . promethazine (PHENERGAN) 12.5 MG tablet Take 1 tablet (12.5 mg total) by mouth every 8 (eight) hours as needed for nausea or vomiting. 30 tablet 0  . SYMBICORT 160-4.5 MCG/ACT inhaler INHALE 2 PUFFS INTO THE LUNGS TWICE DAILY. 10.2 g 5  . traZODone (DESYREL) 50 MG tablet TAKE 3 TABLETS BY MOUTH AT BEDTIME AS NEEDED FOR SLEEP. 270 tablet 0  . venlafaxine XR (EFFEXOR-XR) 75 MG 24 hr capsule TAKE 2 CAPSULES EVERY DAY 180 capsule 1  . VENTOLIN HFA 108 (90 Base) MCG/ACT inhaler INHALE 2 PUFFS EVERY 4 TO 6 HOURS AS NEEDED FOR WHEEZING AND SHORTNESS OF BREATH. 18 g 5  . pioglitazone (ACTOS) 15 MG tablet Take 1 tablet (15 mg total) by mouth daily. (Patient not taking: Reported on 08/14/2018) 90 tablet 1   No current facility-administered medications on file prior to visit.    Allergies  Allergen Reactions  . Codeine Itching  . Flonase [Fluticasone Propionate] Cough    Severe coughing  . Metformin And Related Diarrhea    Caused severe diarrhea and severe GI symptoms---even on low dose 500mg  BID   Social History   Socioeconomic History  . Marital status: Single    Spouse name: Not on file  . Number of children: Not on file  . Years of education: Not on file  . Highest education level: Not on file  Occupational History  . Not on file  Social Needs  . Financial resource strain: Not on file  . Food insecurity    Worry: Not on file    Inability: Not on file  . Transportation needs    Medical: Not on file    Non-medical: Not on file  Tobacco Use  . Smoking status: Former Smoker    Packs/day: 0.50    Years: 52.00    Pack years: 26.00    Types: Cigarettes    Quit date: 08/05/2017    Years since quitting: 1.0  . Smokeless tobacco: Never Used  Substance and Sexual Activity   . Alcohol use: Yes    Comment: rare  . Drug use: Yes    Frequency: 3.0 times per week    Types: Marijuana    Comment: 1-3 times week  . Sexual activity: Not Currently    Birth control/protection: Surgical  Lifestyle  . Physical activity    Days per week: Not on file    Minutes per session: Not on file  . Stress: Not on file  Relationships  . Social Herbalist on phone: Not on file    Gets together: Not on file  Attends religious service: Not on file    Active member of club or organization: Not on file    Attends meetings of clubs or organizations: Not on file    Relationship status: Not on file  . Intimate partner violence    Fear of current or ex partner: Not on file    Emotionally abused: Not on file    Physically abused: Not on file    Forced sexual activity: Not on file  Other Topics Concern  . Not on file  Social History Narrative  . Not on file      Review of Systems  All other systems reviewed and are negative.      Objective:   Physical Exam Vitals signs reviewed.  Constitutional:      General: She is not in acute distress.    Appearance: Normal appearance. She is obese. She is not ill-appearing or toxic-appearing.  Cardiovascular:     Rate and Rhythm: Normal rate and regular rhythm.     Heart sounds: Murmur present.  Pulmonary:     Effort: Pulmonary effort is normal. No respiratory distress.     Breath sounds: Normal breath sounds. No stridor. No wheezing or rhonchi.  Musculoskeletal:     Right lower leg: No edema.     Left lower leg: No edema.  Neurological:     Mental Status: She is alert.           Assessment & Plan:  The primary encounter diagnosis was Dyspnea on exertion. Diagnoses of Diabetes mellitus without complication ([place]), Tobacco use disorder, Hypercholesteremia, and Essential hypertension were also pertinent to this visit. I am very concerned about her sudden change in exercise tolerance and her dyspnea on exertion.   Given her age, her diabetes and the fact she is female I am concerned that this may represent underlying atypical angina and suggest underlying coronary artery disease.  I will obtain an EKG.  I will also check a CBC to evaluate for anemia as well as a CMP, fasting lipid panel, and hemoglobin A1c.  I would recommend starting the patient on farxiga 10 mg a day for her blood sugars and would like to arrange outpatient cardiology consultation for a stress test and echocardiogram to further work-up her fatigue and dyspnea on exertion to rule out underlying coronary artery disease.  I would also recommend starting Toprol-XL 25 mg a day for her blood pressure as well as for the cardioprotective benefit.  EKG today shows nonspecific downsloping ST changes in lead V3, V4 V5 concerning for anterior lateral ischemia.

## 2018-08-15 LAB — COMPLETE METABOLIC PANEL WITH GFR
AG Ratio: 1.4 (calc) (ref 1.0–2.5)
ALT: 20 U/L (ref 6–29)
AST: 25 U/L (ref 10–35)
Albumin: 4.3 g/dL (ref 3.6–5.1)
Alkaline phosphatase (APISO): 121 U/L (ref 37–153)
BUN/Creatinine Ratio: 18 (calc) (ref 6–22)
BUN: 29 mg/dL — ABNORMAL HIGH (ref 7–25)
CO2: 23 mmol/L (ref 20–32)
Calcium: 9.9 mg/dL (ref 8.6–10.4)
Chloride: 105 mmol/L (ref 98–110)
Creat: 1.63 mg/dL — ABNORMAL HIGH (ref 0.60–0.93)
GFR, Est African American: 37 mL/min/{1.73_m2} — ABNORMAL LOW (ref >=60)
GFR, Est Non African American: 32 mL/min/{1.73_m2} — ABNORMAL LOW (ref >=60)
Globulin: 3.1 g/dL (calc) (ref 1.9–3.7)
Glucose, Bld: 168 mg/dL — ABNORMAL HIGH (ref 65–99)
Potassium: 5.3 mmol/L (ref 3.5–5.3)
Sodium: 140 mmol/L (ref 135–146)
Total Bilirubin: 0.2 mg/dL (ref 0.2–1.2)
Total Protein: 7.4 g/dL (ref 6.1–8.1)

## 2018-08-15 LAB — CBC WITH DIFFERENTIAL/PLATELET
Absolute Monocytes: 785 cells/uL (ref 200–950)
Basophils Absolute: 76 cells/uL (ref 0–200)
Basophils Relative: 0.5 %
Eosinophils Absolute: 242 cells/uL (ref 15–500)
Eosinophils Relative: 1.6 %
HCT: 37.8 % (ref 35.0–45.0)
Hemoglobin: 12.4 g/dL (ref 11.7–15.5)
Lymphs Abs: 3382 cells/uL (ref 850–3900)
MCH: 28.4 pg (ref 27.0–33.0)
MCHC: 32.8 g/dL (ref 32.0–36.0)
MCV: 86.7 fL (ref 80.0–100.0)
MPV: 10.9 fL (ref 7.5–12.5)
Monocytes Relative: 5.2 %
Neutro Abs: 10615 cells/uL — ABNORMAL HIGH (ref 1500–7800)
Neutrophils Relative %: 70.3 %
Platelets: 368 10*3/uL (ref 140–400)
RBC: 4.36 10*6/uL (ref 3.80–5.10)
RDW: 13.3 % (ref 11.0–15.0)
Total Lymphocyte: 22.4 %
WBC: 15.1 10*3/uL — ABNORMAL HIGH (ref 3.8–10.8)

## 2018-08-15 LAB — LIPID PANEL
Cholesterol: 134 mg/dL (ref <200)
HDL: 44 mg/dL — ABNORMAL LOW (ref >=50)
LDL Cholesterol (Calc): 69 mg/dL (calc)
Non-HDL Cholesterol (Calc): 90 mg/dL (calc) (ref <130)
Total CHOL/HDL Ratio: 3 (calc) (ref <5.0)
Triglycerides: 119 mg/dL (ref <150)

## 2018-08-15 LAB — HEMOGLOBIN A1C
Hgb A1c MFr Bld: 6.9 % of total Hgb — ABNORMAL HIGH (ref <5.7)
Mean Plasma Glucose: 151 (calc)
eAG (mmol/L): 8.4 (calc)

## 2018-08-15 NOTE — Telephone Encounter (Signed)
LMTRC

## 2018-08-15 NOTE — Telephone Encounter (Signed)
We discussed at visit and I recommended farxiga but her kidney fcn is poor so we switched to januvia 100 mg a day

## 2018-08-15 NOTE — Telephone Encounter (Signed)
Called and spoke to pt and she was talking about Metformin not Metoprolol. She can not take it cause it tears her stomach up too much with excessive diarrhea.

## 2018-08-20 MED ORDER — SITAGLIPTIN PHOSPHATE 100 MG PO TABS: 100.0000 mg | ORAL_TABLET | Freq: Every day | ORAL | 1 refills | Status: DC

## 2018-08-20 NOTE — Telephone Encounter (Signed)
Called pt and lmovm with recommendations.

## 2018-09-08 ENCOUNTER — Telehealth: Payer: Self-pay | Admitting: Family Medicine

## 2018-09-08 NOTE — Telephone Encounter (Signed)
Patient needs refill on her

## 2018-09-09 ENCOUNTER — Telehealth: Payer: Self-pay

## 2018-09-09 NOTE — Telephone Encounter (Signed)
Error

## 2018-09-30 ENCOUNTER — Other Ambulatory Visit: Payer: Self-pay

## 2018-09-30 ENCOUNTER — Ambulatory Visit: Payer: Medicare HMO | Admitting: Cardiology

## 2018-09-30 ENCOUNTER — Encounter: Payer: Self-pay | Admitting: Cardiology

## 2018-09-30 VITALS — BP 132/66 | HR 77 | Temp 97.0°F | Ht 65.0 in | Wt 219.0 lb

## 2018-09-30 DIAGNOSIS — N183 Chronic kidney disease, stage 3 unspecified: Secondary | ICD-10-CM

## 2018-09-30 DIAGNOSIS — E1165 Type 2 diabetes mellitus with hyperglycemia: Secondary | ICD-10-CM

## 2018-09-30 DIAGNOSIS — E782 Mixed hyperlipidemia: Secondary | ICD-10-CM

## 2018-09-30 DIAGNOSIS — R0602 Shortness of breath: Secondary | ICD-10-CM

## 2018-09-30 DIAGNOSIS — I10 Essential (primary) hypertension: Secondary | ICD-10-CM | POA: Diagnosis not present

## 2018-09-30 DIAGNOSIS — E1122 Type 2 diabetes mellitus with diabetic chronic kidney disease: Secondary | ICD-10-CM | POA: Diagnosis not present

## 2018-09-30 DIAGNOSIS — R6889 Other general symptoms and signs: Secondary | ICD-10-CM | POA: Diagnosis not present

## 2018-09-30 DIAGNOSIS — Z20822 Contact with and (suspected) exposure to covid-19: Secondary | ICD-10-CM

## 2018-09-30 NOTE — Progress Notes (Signed)
Cardiology Office Note  Date: 09/30/2018   ID: [name], DOB Jun 15, 1945, MRN [id number]  PCP:  Susy Frizzle, MD  Consulting Cardiologist: Satira Sark, MD Electrophysiologist:  None   Chief Complaint  Patient presents with  . Shortness of Breath    History of Present Illness: [name] is a 73 y.o. female referred for cardiology consultation by Dr. Dennard Schaumann of exertional fatigue and shortness of breath in the setting of uncontrolled type 2 diabetes mellitus.  She states that she is limited by significant knee and foot pain related to arthritis, has a lot of difficulty getting around.  Most recently she has been using a motorized scooter when she goes shopping, also a wheelchair at home.  When she does try to walk practically within the last few months she has been short of breath and fatigue, has to stop.  She describes NYHA class II-III symptoms.  She does not report any exertional chest pain or palpitations.  Family history includes heart disease in her mother, underwent CABG in her late 35s.  Personal risk factors include type 2 diabetes mellitus, hyperlipidemia, and hypertension.  She states that she underwent some type of stress testing approximately 15 to 20 years ago prior to back surgery.  No recent testing.  I did review her ECG from July which is outlined below.  Past Medical History:  Diagnosis Date  . Allergy   . Anxiety   . Arthritis   . Asthma   . Bursitis   . Cataract   . COPD (chronic obstructive pulmonary disease) ([place])   . Depression   . Hiatal hernia   . Hypercholesteremia   . Hypertension   . Type 2 diabetes mellitus ([place])   . Ulcer     Past Surgical History:  Procedure Laterality Date  . ABDOMINAL HYSTERECTOMY    . ANKLE ARTHROSCOPY WITH DRILLING/MICROFRACTURE Left 10/12/2015   Procedure: LEFT ANKLE ARTHROSCOPY, REMOVAL OF OSTEOCHONDRAL LESION WITH MICROFRACTURE;  Surgeon: Trula Slade, DPM;  Location: [place];  Service: Podiatry;  Laterality: Left;  . BIOPSY  10/24/2016   Procedure: BIOPSY;  Surgeon: Daneil Dolin, MD;  [facility];  Service: Endoscopy;;  gastric esophageal  . CATARACT EXTRACTION W/PHACO  10/16/2010   Procedure: CATARACT EXTRACTION PHACO AND INTRAOCULAR LENS PLACEMENT ([place]);  Surgeon: Tonny Branch;  [facility];  Service: Ophthalmology;  Laterality: Left;  CDE: 6.99  . CATARACT EXTRACTION W/PHACO  12/18/2010   Procedure: CATARACT EXTRACTION PHACO AND INTRAOCULAR LENS PLACEMENT ([facility]);  Surgeon: Tonny Branch;  [facility];  Service: Ophthalmology;  Laterality: Right;  CDE 12.50  . COLONOSCOPY     about 5 years ago/[facility]  . COLONOSCOPY WITH PROPOFOL N/A 10/24/2016   Procedure: COLONOSCOPY WITH PROPOFOL;  Surgeon: Daneil Dolin, MD;  [facility];  Service: Endoscopy;  Laterality: N/A;  215  . ESOPHAGOGASTRODUODENOSCOPY  2003   Gastritis, path unavailable.  . ESOPHAGOGASTRODUODENOSCOPY (EGD) WITH PROPOFOL N/A 10/24/2016   Procedure: ESOPHAGOGASTRODUODENOSCOPY (EGD) WITH PROPOFOL;  Surgeon: Daneil Dolin, MD;  [facility];  Service: Endoscopy;  Laterality: N/A;  . EYE SURGERY    . fracture lower back    . left elbow tendon repair    . NEUROPLASTY / TRANSPOSITION MEDIAN NERVE AT CARPAL TUNNEL BILATERAL    . plantar fasc bilateral    . right rotator cuff    . rt foot reconstruction    . trigger thumb  Left    Current Outpatient Medications  Medication Sig Dispense Refill  . atorvastatin (LIPITOR) 80 MG tablet TAKE 1 TABLET EVERY DAY 90 tablet 1  . buPROPion (WELLBUTRIN SR) 150 MG 12 hr tablet TAKE (1) TABLET BY MOUTH TWICE DAILY. 180 tablet 1  . fluticasone (FLONASE) 50 MCG/ACT nasal spray USE 2 SPRAYS IN EACH NOSTRIL EVERY DAY 48 g 3  . glucose blood (ACCU-CHEK AVIVA PLUS) test strip TESTING TWICE DAILY. 100 each PRN  . HYDROcodone-acetaminophen (NORCO) 7.5-325 MG tablet Take 1 tablet by mouth every 6 (six) hours as needed for  moderate pain. 30 tablet 0  . lisinopril (ZESTRIL) 40 MG tablet TAKE (1) TABLET BY MOUTH ONCE DAILY. 90 tablet 3  . metoprolol succinate (TOPROL-XL) 25 MG 24 hr tablet Take 1 tablet (25 mg total) by mouth daily. 90 tablet 3  . montelukast (SINGULAIR) 10 MG tablet TAKE 1 TABLET BY MOUTH ONCE DAILY AS NEEDED FOR ALLERGIES. (Patient taking differently: TAKE 1 TABLET BY MOUTH ONCE IN THE EVENING) 90 tablet 0  . Polyethyl Glycol-Propyl Glycol (SYSTANE OP) Apply 1 drop to eye 3 (three) times daily.    . promethazine (PHENERGAN) 12.5 MG tablet Take 1 tablet (12.5 mg total) by mouth every 8 (eight) hours as needed for nausea or vomiting. 30 tablet 0  . sitaGLIPtin (JANUVIA) 100 MG tablet Take 1 tablet (100 mg total) by mouth daily. 90 tablet 1  . SYMBICORT 160-4.5 MCG/ACT inhaler INHALE 2 PUFFS INTO THE LUNGS TWICE DAILY. 10.2 g 5  . traZODone (DESYREL) 50 MG tablet TAKE 3 TABLETS BY MOUTH AT BEDTIME AS NEEDED FOR SLEEP. 270 tablet 0  . venlafaxine XR (EFFEXOR-XR) 75 MG 24 hr capsule TAKE 2 CAPSULES EVERY DAY 180 capsule 1  . VENTOLIN HFA 108 (90 Base) MCG/ACT inhaler INHALE 2 PUFFS EVERY 4 TO 6 HOURS AS NEEDED FOR WHEEZING AND SHORTNESS OF BREATH. 18 g 5   No current facility-administered medications for this visit.    Allergies:  Codeine, Flonase [fluticasone propionate], and Metformin and related  Social History: The patient  reports that she quit smoking about 13 months ago. Her smoking use included cigarettes. She has a 26.00 pack-year smoking history. She has never used smokeless tobacco. She reports current alcohol use. She reports current drug use. Frequency: 3.00 times per week. Drug: Marijuana.   Family History: The patient's family history includes Birth defects in her grandchild; Breast cancer in her daughter; Cancer in her sister; Heart disease in her mother; Hyperlipidemia in her father and mother.   ROS:  Please see the history of present illness. Otherwise, complete review of systems is  positive for none.  All other systems are reviewed and negative.   Physical Exam: VS:  BP 132/66   Pulse 77   Temp (!) 97 F (36.1 C)   Ht 5\' 5"  (1.651 m)   Wt 219 lb (99.3 kg)   SpO2 97%   BMI 36.44 kg/m , BMI Body mass index is 36.44 kg/m.  Wt Readings from Last 3 Encounters:  09/30/18 219 lb (99.3 kg)  08/14/18 220 lb (99.8 kg)  06/12/18 226 lb (102.5 kg)    General: Patient in no distress, sitting in wheelchair. HEENT: Conjunctiva and lids normal, wearing a mask. Neck: Supple, no elevated JVP or carotid bruits, no thyromegaly. Lungs: Decreased breath sounds without wheezing, nonlabored breathing at rest. Cardiac: Regular rate and rhythm, no S3, soft systolic murmur, no pericardial rub. Abdomen: Soft, nontender, bowel sounds present. Extremities: No pitting edema,  distal pulses 2+. Skin: Warm and dry. Musculoskeletal: No kyphosis. Neuropsychiatric: Alert and oriented x3, affect grossly appropriate.  ECG:  An ECG dated 08/14/2018 was personally reviewed today and demonstrated:  Sinus rhythm with right bundle branch block.  Recent Labwork: 08/14/2018: ALT 20; AST 25; BUN 29; Creat 1.63; Hemoglobin 12.4; Platelets 368; Potassium 5.3; Sodium 140, hemoglobin A1c 6.9%    Component Value Date/Time   CHOL 134 08/14/2018 0946   TRIG 119 08/14/2018 0946   HDL 44 (L) 08/14/2018 0946   CHOLHDL 3.0 08/14/2018 0946   VLDL 30 03/28/2015 1058   LDLCALC 69 08/14/2018 0946    Other Studies Reviewed Today:  No prior cardiac testing for review.  Assessment and Plan:  1.  Exertional shortness of breath and fatigue in a 73 year old woman with type 2 diabetes mellitus, hypertension, hyperlipidemia, and family history of heart disease in her mother.  She does have functional limitation related to what sounds like fairly significant arthritic pain in her legs and feet which could also be contributing.  Plan is to proceed with further cardiac structural and ischemic testing via  echocardiogram and Lexiscan Myoview.  2.  Type 2 diabetes mellitus, recent hemoglobin A1c 6.9%.  She is undergoing medication adjustments per Dr. Dennard Schaumann.  3.  Essential hypertension, systolic is in the [id number] today.  She is on Zestril and Toprol-XL.  4.  Mixed hyperlipidemia on Lipitor.  Recent LDL 69.  5.  CKD stage III, creatinine 1.63 by recent lab work.  Medication Adjustments/Labs and Tests Ordered: Current medicines are reviewed at length with the patient today.  Concerns regarding medicines are outlined above.   Tests Ordered: Orders Placed This Encounter  Procedures  . NM Myocar Multi W/Spect W/Wall Motion / EF  . ECHOCARDIOGRAM COMPLETE    Medication Changes: No orders of the defined types were placed in this encounter.   Disposition:  Follow up test results.  Signed, Satira Sark, MD, [facility] 09/30/2018 11:13 AM     [facility] [address], [place], [place] [address] Phone: [phone]; Fax: [phone]

## 2018-09-30 NOTE — Patient Instructions (Signed)
Medication Instructions: Your physician recommends that you continue on your current medications as directed. Please refer to the Current Medication list given to you today.  Labwork: None today  Procedures/Testing: Your physician has requested that you have an echocardiogram. Echocardiography is a painless test that uses sound waves to create images of your heart. It provides your doctor with information about the size and shape of your heart and how well your heart's chambers and valves are working. This procedure takes approximately one hour. There are no restrictions for this procedure.  Your physician has requested that you have a lexiscan myoview. For further information please visit [email or web address]. Please follow instruction sheet, as given.    Follow-Up: We will call you with results  Any Additional Special Instructions Will Be Listed Below (If Applicable).    Thank you for choosing [facility] !        If you need a refill on your cardiac medications before your next appointment, please call your pharmacy.

## 2018-10-01 LAB — NOVEL CORONAVIRUS, NAA: SARS-CoV-2, NAA: NOT DETECTED

## 2018-10-03 ENCOUNTER — Telehealth: Payer: Self-pay | Admitting: Orthopaedic Surgery

## 2018-10-03 NOTE — Telephone Encounter (Signed)
Patient had called and left voice message late yesterday afternoon, 10/02/18, inquiring about an appointment with Dr Luna Glasgow. I returned call, and patient said she needed the appointment for today, 10/03/18. I relayed Dr Luna Glasgow is not in clinic on Fridays or Mondays, and in discussing next available, states she already scheduled with another doctor. Michela Pitcher would call back if needed anything at another time.

## 2018-10-07 ENCOUNTER — Other Ambulatory Visit: Payer: Self-pay

## 2018-10-07 ENCOUNTER — Encounter (HOSPITAL_COMMUNITY)
Admission: RE | Admit: 2018-10-07 | Discharge: 2018-10-07 | Disposition: A | Discharge status: Home / Self Care | Payer: Medicare HMO | Source: Ambulatory Visit | Attending: Cardiology | Admitting: Cardiology

## 2018-10-07 ENCOUNTER — Ambulatory Visit (HOSPITAL_COMMUNITY)
Admission: RE | Admit: 2018-10-07 | Discharge: 2018-10-07 | Disposition: A | Discharge status: Home / Self Care | Payer: Medicare HMO | Source: Ambulatory Visit | Attending: Cardiology | Admitting: Cardiology

## 2018-10-07 ENCOUNTER — Encounter (HOSPITAL_BASED_OUTPATIENT_CLINIC_OR_DEPARTMENT_OTHER)
Admission: RE | Admit: 2018-10-07 | Discharge: 2018-10-07 | Disposition: A | Discharge status: Home / Self Care | Payer: Medicare HMO | Source: Ambulatory Visit | Attending: Cardiology | Admitting: Cardiology

## 2018-10-07 DIAGNOSIS — R0602 Shortness of breath: Secondary | ICD-10-CM

## 2018-10-07 LAB — NM MYOCAR MULTI W/SPECT W/WALL MOTION / EF
LV dias vol: 65 mL (ref 46–106)
LV sys vol: 31 mL
Peak HR: 80 {beats}/min
RATE: 0.32
Rest HR: 66 {beats}/min
SDS: 2
SRS: 0
SSS: 2
TID: 1.37

## 2018-10-07 MED ORDER — REGADENOSON 0.4 MG/5ML IV SOLN
INTRAVENOUS | Status: AC
  Administered 2018-10-07: 11:00:00 0.4 mg via INTRAVENOUS
  Filled 2018-10-07: qty 5

## 2018-10-07 MED ORDER — TECHNETIUM TC 99M TETROFOSMIN IV KIT
30.0000 | PACK | Freq: Once | INTRAVENOUS | Status: AC | PRN
  Administered 2018-10-07: 31 via INTRAVENOUS

## 2018-10-07 MED ORDER — TECHNETIUM TC 99M TETROFOSMIN IV KIT
10.0000 | PACK | Freq: Once | INTRAVENOUS | Status: AC | PRN
  Administered 2018-10-07: 10:00:00 11 via INTRAVENOUS

## 2018-10-07 MED ORDER — SODIUM CHLORIDE 0.9% FLUSH
INTRAVENOUS | Status: AC
  Administered 2018-10-07: 10 mL via INTRAVENOUS
  Filled 2018-10-07: qty 10

## 2018-10-07 NOTE — Progress Notes (Signed)
*  PRELIMINARY RESULTS* Echocardiogram 2D Echocardiogram has been performed.  Samuel Germany 10/07/2018, 9:16 AM

## 2018-10-09 ENCOUNTER — Ambulatory Visit: Payer: Medicare HMO | Admitting: Orthopaedic Surgery

## 2018-10-28 ENCOUNTER — Encounter: Payer: Self-pay | Admitting: Orthopaedic Surgery

## 2018-10-28 ENCOUNTER — Ambulatory Visit: Payer: Medicare HMO | Admitting: Orthopaedic Surgery

## 2018-11-17 ENCOUNTER — Other Ambulatory Visit: Payer: Self-pay | Admitting: Family Medicine

## 2018-12-05 ENCOUNTER — Other Ambulatory Visit: Payer: Self-pay

## 2018-12-08 ENCOUNTER — Ambulatory Visit: Payer: Medicare HMO | Admitting: Family Medicine

## 2018-12-11 ENCOUNTER — Encounter: Payer: Self-pay | Admitting: Family Medicine

## 2018-12-11 ENCOUNTER — Ambulatory Visit (INDEPENDENT_AMBULATORY_CARE_PROVIDER_SITE_OTHER): Payer: Medicare HMO | Admitting: Family Medicine

## 2018-12-11 ENCOUNTER — Other Ambulatory Visit: Payer: Self-pay

## 2018-12-11 VITALS — BP 144/82 | HR 84 | Temp 97.4°F | Resp 18 | Ht 65.0 in | Wt 216.0 lb

## 2018-12-11 DIAGNOSIS — M2550 Pain in unspecified joint: Secondary | ICD-10-CM | POA: Diagnosis not present

## 2018-12-11 DIAGNOSIS — Z23 Encounter for immunization: Secondary | ICD-10-CM

## 2018-12-11 DIAGNOSIS — M791 Myalgia, unspecified site: Secondary | ICD-10-CM

## 2018-12-11 DIAGNOSIS — J449 Chronic obstructive pulmonary disease, unspecified: Secondary | ICD-10-CM | POA: Diagnosis not present

## 2018-12-11 MED ORDER — HYDROCODONE-ACETAMINOPHEN 7.5-325 MG PO TABS: 1.0000 | ORAL_TABLET | Freq: Four times a day (QID) | ORAL | 0 refills | Status: DC | PRN

## 2018-12-11 NOTE — Progress Notes (Signed)
Subjective:    Patient ID: [name], female    DOB: 06-30-1945, 73 y.o.   MRN: [id number]  HPI Patient is here today requesting a evaluation for fibromyalgia.  She states that she hurts all over.  This is steadily been worsening throughout the year.  She states that she hurts in her shoulders.  She states that she tore her rotator cuff in the right shoulder in the past.  However now she is unable to lift either arm greater than 90 degrees.  The ache and throb constantly in the deltoid and trapezius areas.  She also reports pain in her gluteus muscles bilaterally as well as her hamstrings and quadriceps.  She states that her muscles are getting increasingly weak and hurt simply to touch.  She reports near constant pain in her knees and in her feet.  She is on Lipitor 80 mg a day.  However she has taken this for a long time.  She denies any fevers or chills.  She denies any pain over the temporal artery.  She denies any headache or blurry vision.  She has no history of PMR.  She has no him prior history of temporal arteritis.  She has no history of any autoimmune diseases.  She denies any pain in her hands.  She does report pain in her elbows and in her wrist.  However the majority of the pain is in her hips and in her shoulders. Past Medical History:  Diagnosis Date  . Allergy   . Anxiety   . Arthritis   . Asthma   . Bursitis   . Cataract   . COPD (chronic obstructive pulmonary disease) ([place])   . Depression   . Hiatal hernia   . Hypercholesteremia   . Hypertension   . Type 2 diabetes mellitus ([place])   . Ulcer    Past Surgical History:  Procedure Laterality Date  . ABDOMINAL HYSTERECTOMY    . ANKLE ARTHROSCOPY WITH DRILLING/MICROFRACTURE Left 10/12/2015   Procedure: LEFT ANKLE ARTHROSCOPY, REMOVAL OF OSTEOCHONDRAL LESION WITH MICROFRACTURE;  Surgeon: Trula Slade, DPM;  Location: [place];  Service: Podiatry;  Laterality: Left;  . BIOPSY  10/24/2016   Procedure:  BIOPSY;  Surgeon: Daneil Dolin, MD;  [facility];  Service: Endoscopy;;  gastric esophageal  . CATARACT EXTRACTION W/PHACO  10/16/2010   Procedure: CATARACT EXTRACTION PHACO AND INTRAOCULAR LENS PLACEMENT ([place]);  Surgeon: Tonny Branch;  [facility];  Service: Ophthalmology;  Laterality: Left;  CDE: 6.99  . CATARACT EXTRACTION W/PHACO  12/18/2010   Procedure: CATARACT EXTRACTION PHACO AND INTRAOCULAR LENS PLACEMENT ([facility]);  Surgeon: Tonny Branch;  [facility];  Service: Ophthalmology;  Laterality: Right;  CDE 12.50  . COLONOSCOPY     about 5 years ago/[facility]  . COLONOSCOPY WITH PROPOFOL N/A 10/24/2016   Procedure: COLONOSCOPY WITH PROPOFOL;  Surgeon: Daneil Dolin, MD;  [facility];  Service: Endoscopy;  Laterality: N/A;  215  . ESOPHAGOGASTRODUODENOSCOPY  2003   Gastritis, path unavailable.  . ESOPHAGOGASTRODUODENOSCOPY (EGD) WITH PROPOFOL N/A 10/24/2016   Procedure: ESOPHAGOGASTRODUODENOSCOPY (EGD) WITH PROPOFOL;  Surgeon: Daneil Dolin, MD;  [facility];  Service: Endoscopy;  Laterality: N/A;  . EYE SURGERY    . fracture lower back    . left elbow tendon repair    . NEUROPLASTY / TRANSPOSITION MEDIAN NERVE AT CARPAL TUNNEL BILATERAL    . plantar fasc bilateral    . right rotator cuff    .  rt foot reconstruction    . trigger thumb     Left   Current Outpatient Medications on File Prior to Visit  Medication Sig Dispense Refill  . atorvastatin (LIPITOR) 80 MG tablet TAKE 1 TABLET EVERY DAY 90 tablet 1  . fluticasone (FLONASE) 50 MCG/ACT nasal spray USE 2 SPRAYS IN EACH NOSTRIL EVERY DAY 48 g 3  . glucose blood (ACCU-CHEK AVIVA PLUS) test strip TESTING TWICE DAILY. 100 each PRN  . metoprolol succinate (TOPROL-XL) 25 MG 24 hr tablet Take 1 tablet (25 mg total) by mouth daily. 90 tablet 3  . pioglitazone (ACTOS) 15 MG tablet Take 15 mg by mouth daily.    Vladimir Faster Glycol-Propyl Glycol (SYSTANE OP) Apply 1 drop to eye 3 (three) times daily.    .  SYMBICORT 160-4.5 MCG/ACT inhaler INHALE 2 PUFFS INTO THE LUNGS TWICE DAILY. 10.2 g 5  . traZODone (DESYREL) 50 MG tablet TAKE 3 TABLETS BY MOUTH AT BEDTIME AS NEEDED FOR SLEEP. 270 tablet 0  . venlafaxine XR (EFFEXOR-XR) 75 MG 24 hr capsule TAKE 2 CAPSULES EVERY DAY 180 capsule 1  . VENTOLIN HFA 108 (90 Base) MCG/ACT inhaler INHALE 2 PUFFS EVERY 4 TO 6 HOURS AS NEEDED FOR WHEEZING AND SHORTNESS OF BREATH. 18 g 5   No current facility-administered medications on file prior to visit.    Allergies  Allergen Reactions  . Codeine Itching  . Flonase [Fluticasone Propionate] Cough    Severe coughing  . Metformin And Related Diarrhea    Caused severe diarrhea and severe GI symptoms---even on low dose 500mg  BID   Social History   Socioeconomic History  . Marital status: Single    Spouse name: Not on file  . Number of children: Not on file  . Years of education: Not on file  . Highest education level: Not on file  Occupational History  . Not on file  Social Needs  . Financial resource strain: Not on file  . Food insecurity    Worry: Not on file    Inability: Not on file  . Transportation needs    Medical: Not on file    Non-medical: Not on file  Tobacco Use  . Smoking status: Current Some Day Smoker    Packs/day: 0.50    Years: 52.00    Pack years: 26.00    Types: Cigarettes  . Smokeless tobacco: Never Used  Substance and Sexual Activity  . Alcohol use: Yes    Comment: rare  . Drug use: Yes    Frequency: 3.0 times per week    Types: Marijuana    Comment: not in a few months   . Sexual activity: Not Currently    Birth control/protection: Surgical  Lifestyle  . Physical activity    Days per week: Not on file    Minutes per session: Not on file  . Stress: Not on file  Relationships  . Social Herbalist on phone: Not on file    Gets together: Not on file    Attends religious service: Not on file    Active member of club or organization: Not on file    Attends  meetings of clubs or organizations: Not on file    Relationship status: Not on file  . Intimate partner violence    Fear of current or ex partner: Not on file    Emotionally abused: Not on file    Physically abused: Not on file    Forced sexual activity: Not  on file  Other Topics Concern  . Not on file  Social History Narrative  . Not on file      Review of Systems  All other systems reviewed and are negative.      Objective:   Physical Exam Vitals signs reviewed.  Constitutional:      General: She is not in acute distress.    Appearance: Normal appearance. She is obese. She is not ill-appearing or toxic-appearing.  Cardiovascular:     Rate and Rhythm: Normal rate and regular rhythm.     Heart sounds: Murmur present.  Pulmonary:     Effort: Pulmonary effort is normal. No respiratory distress.     Breath sounds: Normal breath sounds. No stridor. No wheezing or rhonchi.  Musculoskeletal:     Right shoulder: She exhibits decreased range of motion, tenderness, pain and decreased strength.     Left shoulder: She exhibits decreased range of motion, tenderness and pain.     Right hip: She exhibits decreased range of motion and tenderness.     Left hip: She exhibits decreased range of motion and tenderness.     Right upper leg: She exhibits tenderness.     Left upper leg: She exhibits tenderness.     Right lower leg: No edema.     Left lower leg: No edema.  Neurological:     Mental Status: She is alert.           Assessment & Plan:  The primary encounter diagnosis was Myalgia. A diagnosis of Polyarthralgia was also pertinent to this visit. I am concerned about statin induced myopathy.  I have recommended that she temporarily discontinue Lipitor and over the next 1 to 2 weeks see if the pain improves.  I will check a CK level to evaluate for any evidence of rhabdomyolysis.  Also given her age, the patient would be at risk for polymyalgia rheumatica.  I will check a  sedimentation rate particular given the fact that the pain seems to center in her shoulder and her hip area.  Autoimmune diseases are also a possibility and therefore I will check a sed rate along with an ANA and a rheumatoid factor.  Additionally I will check a CMP to monitor her alkaline phosphatase for any evidence of increased bone turnover as well as check a CBC to evaluate for any bone marrow abnormalities.  If lab work is completely normal and pain improves if she holds Lipitor, we will need to find another option for her cholesterol.  If labs are completely normal and pain does not improve with Lipitor, I would recommend x-rays of the involved areas to evaluate for osteoarthritis and if these are negative, certainly the patient could have a diagnosis of fibromyalgia.  However I explained to the patient that this is a process of illumination rather than a test to confirm fibromyalgia.  I did temporarily refill her hydrocodone which she uses sparingly for pain.  The patient was given 30 tablets

## 2018-12-11 NOTE — Addendum Note (Signed)
Addended by: Shary Decamp B on: 12/11/2018 04:26 PM   Modules accepted: Orders

## 2018-12-13 LAB — COMPLETE METABOLIC PANEL WITH GFR
AG Ratio: 1.4 (calc) (ref 1.0–2.5)
ALT: 20 U/L (ref 6–29)
AST: 24 U/L (ref 10–35)
Albumin: 4.3 g/dL (ref 3.6–5.1)
Alkaline phosphatase (APISO): 130 U/L (ref 37–153)
BUN/Creatinine Ratio: 18 (calc) (ref 6–22)
BUN: 20 mg/dL (ref 7–25)
CO2: 29 mmol/L (ref 20–32)
Calcium: 9.4 mg/dL (ref 8.6–10.4)
Chloride: 99 mmol/L (ref 98–110)
Creat: 1.13 mg/dL — ABNORMAL HIGH (ref 0.60–0.93)
GFR, Est African American: 57 mL/min/{1.73_m2} — ABNORMAL LOW (ref >=60)
GFR, Est Non African American: 49 mL/min/{1.73_m2} — ABNORMAL LOW (ref >=60)
Globulin: 3.1 g/dL (calc) (ref 1.9–3.7)
Glucose, Bld: 85 mg/dL (ref 65–99)
Potassium: 4.8 mmol/L (ref 3.5–5.3)
Sodium: 139 mmol/L (ref 135–146)
Total Bilirubin: 0.3 mg/dL (ref 0.2–1.2)
Total Protein: 7.4 g/dL (ref 6.1–8.1)

## 2018-12-13 LAB — CBC WITH DIFFERENTIAL/PLATELET
Absolute Monocytes: 776 cells/uL (ref 200–950)
Basophils Absolute: 71 cells/uL (ref 0–200)
Basophils Relative: 0.5 %
Eosinophils Absolute: 353 cells/uL (ref 15–500)
Eosinophils Relative: 2.5 %
HCT: 36.2 % (ref 35.0–45.0)
Hemoglobin: 11.8 g/dL (ref 11.7–15.5)
Lymphs Abs: 4639 cells/uL — ABNORMAL HIGH (ref 850–3900)
MCH: 27.8 pg (ref 27.0–33.0)
MCHC: 32.6 g/dL (ref 32.0–36.0)
MCV: 85.4 fL (ref 80.0–100.0)
MPV: 10.6 fL (ref 7.5–12.5)
Monocytes Relative: 5.5 %
Neutro Abs: 8263 cells/uL — ABNORMAL HIGH (ref 1500–7800)
Neutrophils Relative %: 58.6 %
Platelets: 350 10*3/uL (ref 140–400)
RBC: 4.24 10*6/uL (ref 3.80–5.10)
RDW: 13.1 % (ref 11.0–15.0)
Total Lymphocyte: 32.9 %
WBC: 14.1 10*3/uL — ABNORMAL HIGH (ref 3.8–10.8)

## 2018-12-13 LAB — RHEUMATOID FACTOR: Rheumatoid fact SerPl-aCnc: <14 IU/mL (ref <14)

## 2018-12-13 LAB — SEDIMENTATION RATE: Sed Rate: 89 mm/h — ABNORMAL HIGH (ref 0–30)

## 2018-12-13 LAB — CK: Total CK: 114 U/L (ref 29–143)

## 2018-12-13 LAB — ANA: Anti Nuclear Antibody (ANA): NEGATIVE

## 2018-12-15 ENCOUNTER — Other Ambulatory Visit: Payer: Self-pay

## 2018-12-15 DIAGNOSIS — M791 Myalgia, unspecified site: Secondary | ICD-10-CM

## 2018-12-15 MED ORDER — PREDNISONE 5 MG PO TABS: 15.0000 mg | ORAL_TABLET | Freq: Every day | ORAL | 0 refills | Status: DC

## 2018-12-23 ENCOUNTER — Ambulatory Visit: Payer: Medicare HMO | Admitting: Family Medicine

## 2018-12-24 ENCOUNTER — Other Ambulatory Visit: Payer: Self-pay | Admitting: Family Medicine

## 2018-12-24 DIAGNOSIS — J209 Acute bronchitis, unspecified: Secondary | ICD-10-CM

## 2018-12-26 ENCOUNTER — Other Ambulatory Visit: Payer: Self-pay

## 2018-12-26 ENCOUNTER — Ambulatory Visit (INDEPENDENT_AMBULATORY_CARE_PROVIDER_SITE_OTHER): Payer: Medicare HMO | Admitting: Family Medicine

## 2018-12-26 DIAGNOSIS — J441 Chronic obstructive pulmonary disease with (acute) exacerbation: Secondary | ICD-10-CM | POA: Diagnosis not present

## 2018-12-26 DIAGNOSIS — J45901 Unspecified asthma with (acute) exacerbation: Secondary | ICD-10-CM

## 2018-12-26 DIAGNOSIS — M353 Polymyalgia rheumatica: Secondary | ICD-10-CM | POA: Diagnosis not present

## 2018-12-26 MED ORDER — PREDNISONE 20 MG PO TABS: 60.0000 mg | ORAL_TABLET | Freq: Every day | ORAL | 0 refills | Status: DC

## 2018-12-26 NOTE — Progress Notes (Signed)
Subjective:    Patient ID: [name], female    DOB: 11/22/1945, 73 y.o.   MRN: [id number]  HPI  12/11/18 Patient is here today requesting a evaluation for fibromyalgia.  She states that she hurts all over.  This is steadily been worsening throughout the year.  She states that she hurts in her shoulders.  She states that she tore her rotator cuff in the right shoulder in the past.  However now she is unable to lift either arm greater than 90 degrees.  The ache and throb constantly in the deltoid and trapezius areas.  She also reports pain in her gluteus muscles bilaterally as well as her hamstrings and quadriceps.  She states that her muscles are getting increasingly weak and hurt simply to touch.  She reports near constant pain in her knees and in her feet.  She is on Lipitor 80 mg a day.  However she has taken this for a long time.  She denies any fevers or chills.  She denies any pain over the temporal artery.  She denies any headache or blurry vision.  She has no history of PMR.  She has no him prior history of temporal arteritis.  She has no history of any autoimmune diseases.  She denies any pain in her hands.  She does report pain in her elbows and in her wrist.  However the majority of the pain is in her hips and in her shoulders.  At that time, my plan was: I am concerned about statin induced myopathy.  I have recommended that she temporarily discontinue Lipitor and over the next 1 to 2 weeks see if the pain improves.  I will check a CK level to evaluate for any evidence of rhabdomyolysis.  Also given her age, the patient would be at risk for polymyalgia rheumatica.  I will check a sedimentation rate particular given the fact that the pain seems to center in her shoulder and her hip area.  Autoimmune diseases are also a possibility and therefore I will check a sed rate along with an ANA and a rheumatoid factor.  Additionally I will check a CMP to monitor her alkaline phosphatase for any  evidence of increased bone turnover as well as check a CBC to evaluate for any bone marrow abnormalities.  If lab work is completely normal and pain improves if she holds Lipitor, we will need to find another option for her cholesterol.  If labs are completely normal and pain does not improve with Lipitor, I would recommend x-rays of the involved areas to evaluate for osteoarthritis and if these are negative, certainly the patient could have a diagnosis of fibromyalgia.  However I explained to the patient that this is a process of elimination rather than a test to confirm fibromyalgia.  I did temporarily refill her hydrocodone which she uses sparingly for pain.  The patient was given 30 tablets  12/26/18 Patient is being seen today as a telephone visit.  Phone call began at 1215.  Phone call concluded at 1228.  Lab work showed a normal CK level.  Therefore I did not feel that this was statin induced myopathy.  Instead her sedimentation rate was significantly high at greater than 80.  I felt that this was most likely significant for possible PMR.  Therefore I started the patient on prednisone 15 mg a day.  There was issues contacting the patient and she initially did not take the medication correctly however over the last several  days she has been taking 15 mg a day and she states that there has been almost miraculous improvement in her myalgias.  She states that she is still sore in her shoulders and in her hips but there has been at least 80% improvement.  She states that she can at least walk now and get out of a chair without significant pain.  The muscle tenderness has improved dramatically all of which would be consistent with possible PMR.  However, the reason the patient is being seen today as a telephone visit is due to the fact she is experiencing coughing and shortness of breath.  The patient has a history of COPD with asthma usually triggered by allergies.  She states that she woke up this morning  wheezing and short of breath.  She is coughing due to the wheezing.  I can hear the patient wheeze over the telephone during our encounter.  She denies any fever.  She denies any rhinorrhea.  She denies any sore throat or signs of systemic illness.  She does report tightness in her chest and wheezing.  Symptoms are more consistent with her asthma.  She does not feel that she has been exposed to COVID-19.  She states that she lives alone and she has not been around any individuals in the last week. Past Medical History:  Diagnosis Date   Allergy    Anxiety    Arthritis    Asthma    Bursitis    Cataract    COPD (chronic obstructive pulmonary disease) ([facility])    Depression    Hiatal hernia    Hypercholesteremia    Hypertension    Type 2 diabetes mellitus ([place])    Ulcer    Past Surgical History:  Procedure Laterality Date   ABDOMINAL HYSTERECTOMY     ANKLE ARTHROSCOPY WITH DRILLING/MICROFRACTURE Left 10/12/2015   Procedure: LEFT ANKLE ARTHROSCOPY, REMOVAL OF OSTEOCHONDRAL LESION WITH MICROFRACTURE;  Surgeon: Trula Slade, DPM;  Location: [place];  Service: Podiatry;  Laterality: Left;   BIOPSY  10/24/2016   Procedure: BIOPSY;  Surgeon: Daneil Dolin, MD;  [facility];  Service: Endoscopy;;  gastric esophageal   CATARACT EXTRACTION [facility]  10/16/2010   Procedure: CATARACT EXTRACTION PHACO AND INTRAOCULAR LENS PLACEMENT ([place]);  Surgeon: Tonny Branch;  [facility];  Service: Ophthalmology;  Laterality: Left;  CDE: 6.99   CATARACT EXTRACTION W/PHACO  12/18/2010   Procedure: CATARACT EXTRACTION PHACO AND INTRAOCULAR LENS PLACEMENT ([facility]);  Surgeon: Tonny Branch;  [facility];  Service: Ophthalmology;  Laterality: Right;  CDE 12.50   COLONOSCOPY     about 5 years ago/[facility]   COLONOSCOPY WITH PROPOFOL N/A 10/24/2016   Procedure: COLONOSCOPY WITH PROPOFOL;  Surgeon: Daneil Dolin, MD;  [facility];  Service: Endoscopy;   Laterality: N/A;  215   ESOPHAGOGASTRODUODENOSCOPY  2003   Gastritis, path unavailable.   ESOPHAGOGASTRODUODENOSCOPY (EGD) WITH PROPOFOL N/A 10/24/2016   Procedure: ESOPHAGOGASTRODUODENOSCOPY (EGD) WITH PROPOFOL;  Surgeon: Daneil Dolin, MD;  [facility];  Service: Endoscopy;  Laterality: N/A;   EYE SURGERY     fracture lower back     left elbow tendon repair     NEUROPLASTY / TRANSPOSITION MEDIAN NERVE AT CARPAL TUNNEL BILATERAL     plantar fasc bilateral     right rotator cuff     rt foot reconstruction     trigger thumb     Left   Current Outpatient Medications on  File Prior to Visit  Medication Sig Dispense Refill   atorvastatin (LIPITOR) 80 MG tablet TAKE 1 TABLET EVERY DAY 90 tablet 1   fluticasone (FLONASE) 50 MCG/ACT nasal spray USE 2 SPRAYS IN EACH NOSTRIL EVERY DAY 48 g 3   glucose blood (ACCU-CHEK AVIVA PLUS) test strip TESTING TWICE DAILY. 100 each PRN   HYDROcodone-acetaminophen (NORCO) 7.5-325 MG tablet Take 1 tablet by mouth every 6 (six) hours as needed for moderate pain. 30 tablet 0   metoprolol succinate (TOPROL-XL) 25 MG 24 hr tablet Take 1 tablet (25 mg total) by mouth daily. 90 tablet 3   pioglitazone (ACTOS) 15 MG tablet Take 15 mg by mouth daily.     Polyethyl Glycol-Propyl Glycol (SYSTANE OP) Apply 1 drop to eye 3 (three) times daily.     predniSONE (DELTASONE) 5 MG tablet Take 3 tablets (15 mg total) by mouth daily with breakfast. 21 tablet 0   SYMBICORT 160-4.5 MCG/ACT inhaler INHALE 2 PUFFS INTO THE LUNGS TWICE DAILY. 10.2 g 5   traZODone (DESYREL) 50 MG tablet TAKE 3 TABLETS BY MOUTH AT BEDTIME AS NEEDED FOR SLEEP. 270 tablet 0   venlafaxine XR (EFFEXOR-XR) 75 MG 24 hr capsule TAKE 2 CAPSULES EVERY DAY 180 capsule 1   VENTOLIN HFA 108 (90 Base) MCG/ACT inhaler INHALE 2 PUFFS EVERY 4 TO 6 HOURS AS NEEDED FOR WHEEZING AND SHORTNESS OF BREATH. 18 g 0   No current facility-administered medications on file prior to visit.     Allergies  Allergen Reactions   Codeine Itching   Flonase [Fluticasone Propionate] Cough    Severe coughing   Metformin And Related Diarrhea    Caused severe diarrhea and severe GI symptoms---even on low dose 500mg  BID   Social History   Socioeconomic History   Marital status: Single    Spouse name: Not on file   Number of children: Not on file   Years of education: Not on file   Highest education level: Not on file  Occupational History   Not on file  Social Needs   Financial resource strain: Not on file   Food insecurity    Worry: Not on file    Inability: Not on file   Transportation needs    Medical: Not on file    Non-medical: Not on file  Tobacco Use   Smoking status: Current Some Day Smoker    Packs/day: 0.50    Years: 52.00    Pack years: 26.00    Types: Cigarettes   Smokeless tobacco: Never Used  Substance and Sexual Activity   Alcohol use: Yes    Comment: rare   Drug use: Yes    Frequency: 3.0 times per week    Types: Marijuana    Comment: not in a few months    Sexual activity: Not Currently    Birth control/protection: Surgical  Lifestyle   Physical activity    Days per week: Not on file    Minutes per session: Not on file   Stress: Not on file  Relationships   Social connections    Talks on phone: Not on file    Gets together: Not on file    Attends religious service: Not on file    Active member of club or organization: Not on file    Attends meetings of clubs or organizations: Not on file    Relationship status: Not on file   Intimate partner violence    Fear of current or ex partner: Not on  file    Emotionally abused: Not on file    Physically abused: Not on file    Forced sexual activity: Not on file  Other Topics Concern   Not on file  Social History Narrative   Not on file      Review of Systems  All other systems reviewed and are negative.      Objective:   Physical exam could not be performed as  the patient was seen as a telephone visit.  However she was audibly wheezing on our telephone visit.  She states that she is afebrile.  She denies any chest pain although she does report some tightness in her lungs.  She states that she ran out of her inhaler and she did not have any this morning and therefore she is on the way to the pharmacy to get that right now.       Assessment & Plan:  Asthma exacerbation in COPD ([place])  PMR (polymyalgia rheumatica) ([place])  Patient feels that it is unlikely she has COVID-19 as she has been quarantine for more than a week.  She believes this is her typical asthma exacerbation.  She sounds clinically consistent with someone having an asthma attack based on her wheezing.  I have recommended starting prednisone 60 mg a day and using albuterol 2 puffs inhaled every 6 hours as needed.  I have asked the patient to call me back Monday for recheck and if she is feeling better at that point we will discuss PMR at length.  Temporarily discontinue the prednisone 15 mg.  Patient does not feel that she needs to go to the emergency room.  However I have asked the patient to go the ER immediately if her breathing worsens.  Patient is in agreement.

## 2018-12-29 ENCOUNTER — Other Ambulatory Visit: Payer: Self-pay | Admitting: Family Medicine

## 2019-01-13 ENCOUNTER — Other Ambulatory Visit: Payer: Self-pay

## 2019-01-13 ENCOUNTER — Encounter: Payer: Self-pay | Admitting: Orthopaedic Surgery

## 2019-01-13 ENCOUNTER — Ambulatory Visit (INDEPENDENT_AMBULATORY_CARE_PROVIDER_SITE_OTHER): Payer: Medicare HMO | Admitting: Orthopaedic Surgery

## 2019-01-13 VITALS — BP 174/88 | HR 82 | Temp 97.1°F | Ht 65.0 in | Wt 220.0 lb

## 2019-01-13 DIAGNOSIS — M79672 Pain in left foot: Secondary | ICD-10-CM | POA: Diagnosis not present

## 2019-01-13 DIAGNOSIS — M79671 Pain in right foot: Secondary | ICD-10-CM

## 2019-01-13 NOTE — Patient Instructions (Signed)

## 2019-01-13 NOTE — Progress Notes (Signed)
Subjective:    Patient ID: [name], female    DOB: February 28, 1945, 73 y.o.   MRN: [id number]  HPI She has a long history of fibromyalgia and pain of the lower back, hips, knees, feet and legs bilaterally.  She has no trauma.  She was seen at [facility] on 12-11-2018 and given 60 mgm prednisone dose for several days.  She was also told to stop her Statin drug Lipitor as this could cause the problem. She took the prednisone and felt so well she resumed the Lipitor.  Now she has had recurrence of her pain.    I told her that the statin drug could indeed cause muscle and leg pains.  I see several people every year with this.  Since she has recurrent pain again and is on the statin drug, I would recommend she stop the Lipitor now for two weeks and see how she does.  If she gets less pain and feels better, then that would be the cause.  If not, then it is not the statin drug.  I will not give prednisone now.  I have recommended Aleve one bid pc.  I will defer any x-rays today for if she improves, then there is no need for them.  If her pain continues or is worse, then I will get imaging done.  She is agreeable to this.  I told her that she is on the statin drug for a reason and she will need to talk to [facility] about an alternative if the statin is a problem.      Review of Systems  Constitutional: Positive for activity change.  Respiratory: Positive for shortness of breath.   Musculoskeletal: Positive for arthralgias, back pain, gait problem and myalgias.  Psychiatric/Behavioral: The patient is nervous/anxious.   All other systems reviewed and are negative.  For Review of Systems, all other systems reviewed and are negative.  The following is a summary of the past history medically, past history surgically, known current medicines, social history and family history.  This information is gathered electronically by the computer from prior information and documentation.   I review this each visit and have found including this information at this point in the chart is beneficial and informative.   Past Medical History:  Diagnosis Date   Allergy    Anxiety    Arthritis    Asthma    Bursitis    Cataract    COPD (chronic obstructive pulmonary disease) (HCC)    Depression    Hiatal hernia    Hypercholesteremia    Hypertension    Type 2 diabetes mellitus ([place])    Ulcer     Past Surgical History:  Procedure Laterality Date   ABDOMINAL HYSTERECTOMY     ANKLE ARTHROSCOPY WITH DRILLING/MICROFRACTURE Left 10/12/2015   Procedure: LEFT ANKLE ARTHROSCOPY, REMOVAL OF OSTEOCHONDRAL LESION WITH MICROFRACTURE;  Surgeon: Trula Slade, DPM;  Location: [place];  Service: Podiatry;  Laterality: Left;   BIOPSY  10/24/2016   Procedure: BIOPSY;  Surgeon: Daneil Dolin, MD;  [facility];  Service: Endoscopy;;  gastric esophageal   CATARACT EXTRACTION [facility]  10/16/2010   Procedure: CATARACT EXTRACTION PHACO AND INTRAOCULAR LENS PLACEMENT ([place]);  Surgeon: Tonny Branch;  Location: [facility];  Service: Ophthalmology;  Laterality: Left;  CDE: 6.99   CATARACT EXTRACTION W/PHACO  12/18/2010   Procedure: CATARACT EXTRACTION PHACO AND INTRAOCULAR LENS PLACEMENT ([facility]);  Surgeon: Tonny Branch;  Location: [facility];  Service: Ophthalmology;  Laterality: Right;  CDE 12.50   COLONOSCOPY     about 5 years ago/[facility]   COLONOSCOPY WITH PROPOFOL N/A 10/24/2016   Procedure: COLONOSCOPY WITH PROPOFOL;  Surgeon: Daneil Dolin, MD;  [facility];  Service: Endoscopy;  Laterality: N/A;  215   ESOPHAGOGASTRODUODENOSCOPY  2003   Gastritis, path unavailable.   ESOPHAGOGASTRODUODENOSCOPY (EGD) WITH PROPOFOL N/A 10/24/2016   Procedure: ESOPHAGOGASTRODUODENOSCOPY (EGD) WITH PROPOFOL;  Surgeon: Daneil Dolin, MD;  [facility];  Service: Endoscopy;  Laterality: N/A;   EYE SURGERY     fracture lower back     left elbow tendon  repair     NEUROPLASTY / TRANSPOSITION MEDIAN NERVE AT CARPAL TUNNEL BILATERAL     plantar fasc bilateral     right rotator cuff     rt foot reconstruction     trigger thumb     Left    Current Outpatient Medications on File Prior to Visit  Medication Sig Dispense Refill   atorvastatin (LIPITOR) 80 MG tablet TAKE 1 TABLET EVERY DAY 90 tablet 1   fluticasone (FLONASE) 50 MCG/ACT nasal spray USE 2 SPRAYS IN EACH NOSTRIL EVERY DAY 48 g 3   glucose blood (ACCU-CHEK AVIVA PLUS) test strip TESTING TWICE DAILY. 100 each PRN   HYDROcodone-acetaminophen (NORCO) 7.5-325 MG tablet Take 1 tablet by mouth every 6 (six) hours as needed for moderate pain. 30 tablet 0   metoprolol succinate (TOPROL-XL) 25 MG 24 hr tablet Take 1 tablet (25 mg total) by mouth daily. 90 tablet 3   pioglitazone (ACTOS) 15 MG tablet Take 15 mg by mouth daily.     Polyethyl Glycol-Propyl Glycol (SYSTANE OP) Apply 1 drop to eye 3 (three) times daily.     predniSONE (DELTASONE) 20 MG tablet Take 3 tablets (60 mg total) by mouth daily with breakfast. 18 tablet 0   predniSONE (DELTASONE) 5 MG tablet Take 3 tablets (15 mg total) by mouth daily with breakfast. 21 tablet 0   SYMBICORT 160-4.5 MCG/ACT inhaler INHALE 2 PUFFS INTO THE LUNGS TWICE DAILY. 10.2 g 5   traZODone (DESYREL) 50 MG tablet TAKE 3 TABLETS BY MOUTH AT BEDTIME AS NEEDED FOR SLEEP. 270 tablet 0   venlafaxine XR (EFFEXOR-XR) 75 MG 24 hr capsule TAKE 2 CAPSULES EVERY DAY 180 capsule 1   VENTOLIN HFA 108 (90 Base) MCG/ACT inhaler INHALE 2 PUFFS EVERY 4 TO 6 HOURS AS NEEDED FOR WHEEZING AND SHORTNESS OF BREATH. 18 g 0   No current facility-administered medications on file prior to visit.    Social History   Socioeconomic History   Marital status: Single    Spouse name: Not on file   Number of children: Not on file   Years of education: Not on file   Highest education level: Not on file  Occupational History   Not on file  Tobacco Use    Smoking status: Current Some Day Smoker    Packs/day: 0.50    Years: 52.00    Pack years: 26.00    Types: Cigarettes   Smokeless tobacco: Never Used  Substance and Sexual Activity   Alcohol use: Yes    Comment: rare   Drug use: Yes    Frequency: 3.0 times per week    Types: Marijuana    Comment: not in a few months    Sexual activity: Not Currently    Birth control/protection: Surgical  Other Topics Concern   Not on file  Social  History Narrative   Not on file   Social Determinants of Health   Financial Resource Strain:    Difficulty of Paying Living Expenses: Not on file  Food Insecurity:    Worried About [place] in the Last Year: Not on file   Ran Out of Food in the Last Year: Not on file  Transportation Needs:    Lack of Transportation (Medical): Not on file   Lack of Transportation (Non-Medical): Not on file  Physical Activity:    Days of Exercise per Week: Not on file   Minutes of Exercise per Session: Not on file  Stress:    Feeling of Stress : Not on file  Social Connections:    Frequency of Communication with Friends and Family: Not on file   Frequency of Social Gatherings with Friends and Family: Not on file   Attends Religious Services: Not on file   Active Member of Clubs or Organizations: Not on file   Attends Archivist Meetings: Not on file   Marital Status: Not on file  Intimate Partner Violence:    Fear of Current or Ex-Partner: Not on file   Emotionally Abused: Not on file   Physically Abused: Not on file   Sexually Abused: Not on file    Family History  Problem Relation Age of Onset   Breast cancer Daughter    Heart disease Mother    Hyperlipidemia Mother    Hyperlipidemia Father    Cancer Sister    Birth defects Grandchild    Anesthesia problems Neg Hx    Hypotension Neg Hx    Malignant hyperthermia Neg Hx    Pseudochol deficiency Neg Hx    Colon cancer Neg Hx     BP (!) 174/88     Pulse 82    Temp (!) 97.1 F (36.2 C)    Ht 5\' 5"  (1.651 m)    Wt 220 lb (99.8 kg)    BMI 36.61 kg/m   Body mass index is 36.61 kg/m.     Objective:   Physical Exam Vitals reviewed.  Constitutional:      Appearance: She is well-developed.  HENT:     Head: Normocephalic and atraumatic.  Eyes:     Conjunctiva/sclera: Conjunctivae normal.     Pupils: Pupils are equal, round, and reactive to light.  Cardiovascular:     Rate and Rhythm: Normal rate and regular rhythm.  Pulmonary:     Effort: Pulmonary effort is normal.  Abdominal:     Palpations: Abdomen is soft.  Musculoskeletal:     Cervical back: Normal range of motion and neck supple.       Legs:  Skin:    General: Skin is warm and dry.  Neurological:     Mental Status: She is alert and oriented to person, place, and time.     Cranial Nerves: No cranial nerve deficit.     Motor: No abnormal muscle tone.     Coordination: Coordination normal.     Deep Tendon Reflexes: Reflexes are normal and symmetric. Reflexes normal.  Psychiatric:        Behavior: Behavior normal.        Thought Content: Thought content normal.        Judgment: Judgment normal.      I have reviewed the notes from [facility]. She did have a markedly elevated sed rate then, 89.    Assessment & Plan:   Encounter Diagnoses  Name  Primary?   Pain in left foot Yes   Pain in right foot    Return in two weeks.  Stop the Lipitor.  Begin Aleve one bid pc.  Call if any problem.  Precautions discussed.  She has chronic pain of the right foot and left foot and had seen a podiatrist until he retired.  I will have her see [facility] for this.   Consider x-rays and other imaging if worse.  Electronically Signed Sanjuana Kava, MD 12/22/20203:52 PM

## 2019-01-21 ENCOUNTER — Telehealth: Payer: Self-pay | Admitting: Orthopaedic Surgery

## 2019-01-21 NOTE — Telephone Encounter (Signed)
Patient called requesting refill on pain medication: HYDROcodone-acetaminophen (NORCO) 7.5-325 MG tablet 30 tablet  -[place]   - please advise.

## 2019-01-22 ENCOUNTER — Other Ambulatory Visit: Payer: Self-pay | Admitting: Family Medicine

## 2019-01-22 MED ORDER — HYDROCODONE-ACETAMINOPHEN 5-325 MG PO TABS: ORAL_TABLET | ORAL | 0 refills | Status: DC

## 2019-01-22 NOTE — Telephone Encounter (Signed)
Medication refilled on 01/22/2019 from Dr. Luna Glasgow.   Please refuse refill.

## 2019-01-27 ENCOUNTER — Other Ambulatory Visit: Payer: Self-pay

## 2019-01-27 ENCOUNTER — Ambulatory Visit: Payer: Medicare HMO | Admitting: Orthopaedic Surgery

## 2019-01-27 ENCOUNTER — Ambulatory Visit: Payer: Medicare HMO

## 2019-01-27 ENCOUNTER — Encounter: Payer: Self-pay | Admitting: Orthopaedic Surgery

## 2019-01-27 VITALS — BP 153/82 | HR 93 | Ht 65.0 in | Wt 213.0 lb

## 2019-01-27 DIAGNOSIS — M545 Low back pain, unspecified: Secondary | ICD-10-CM

## 2019-01-27 NOTE — Progress Notes (Signed)
Patient PQ:[id number] [name], female DOB:[birth year], 74 y.o. [id number]  Chief Complaint  Patient presents with  . Joint Pain    entire body hurts     HPI  [name] is a 74 y.o. female who says she hurts all over from the top of her head to the bottom of her toes.  She has more pain in the lumbar spine area with complaints of pain running down both legs.  She had stopped her statin drugs but did not see any change in her situation.  I told her I would hold off on imaging.  I will get lumbar spine and MRI of the lumbar spine and see her back after that.   Body mass index is 35.45 kg/m.  ROS  Review of Systems  Constitutional: Positive for activity change.  Respiratory: Positive for shortness of breath.   Musculoskeletal: Positive for arthralgias, back pain, gait problem and myalgias.  Psychiatric/Behavioral: The patient is nervous/anxious.   All other systems reviewed and are negative.   All other systems reviewed and are negative.  The following is a summary of the past history medically, past history surgically, known current medicines, social history and family history.  This information is gathered electronically by the computer from prior information and documentation.  I review this each visit and have found including this information at this point in the chart is beneficial and informative.    Past Medical History:  Diagnosis Date  . Allergy   . Anxiety   . Arthritis   . Asthma   . Bursitis   . Cataract   . COPD (chronic obstructive pulmonary disease) ([place])   . Depression   . Hiatal hernia   . Hypercholesteremia   . Hypertension   . Type 2 diabetes mellitus ([place])   . Ulcer     Past Surgical History:  Procedure Laterality Date  . ABDOMINAL HYSTERECTOMY    . ANKLE ARTHROSCOPY WITH DRILLING/MICROFRACTURE Left 10/12/2015   Procedure: LEFT ANKLE ARTHROSCOPY, REMOVAL OF OSTEOCHONDRAL LESION WITH MICROFRACTURE;  Surgeon: Trula Slade, DPM;  Location:  [place];  Service: Podiatry;  Laterality: Left;  . BIOPSY  10/24/2016   Procedure: BIOPSY;  Surgeon: Daneil Dolin, MD;  [facility];  Service: Endoscopy;;  gastric esophageal  . CATARACT EXTRACTION W/PHACO  10/16/2010   Procedure: CATARACT EXTRACTION PHACO AND INTRAOCULAR LENS PLACEMENT ([place]);  Surgeon: Tonny Branch;  Location[facility];  Service: Ophthalmology;  Laterality: Left;  CDE: 6.99  . CATARACT EXTRACTION W/PHACO  12/18/2010   Procedure: CATARACT EXTRACTION PHACO AND INTRAOCULAR LENS PLACEMENT ([facility]);  Surgeon: Tonny Branch;  Location: [facility];  Service: Ophthalmology;  Laterality: Right;  CDE 12.50  . COLONOSCOPY     about 5 years ago/[facility]  . COLONOSCOPY WITH PROPOFOL N/A 10/24/2016   Procedure: COLONOSCOPY WITH PROPOFOL;  Surgeon: Daneil Dolin, MD;  [facility];  Service: Endoscopy;  Laterality: N/A;  215  . ESOPHAGOGASTRODUODENOSCOPY  2003   Gastritis, path unavailable.  . ESOPHAGOGASTRODUODENOSCOPY (EGD) WITH PROPOFOL N/A 10/24/2016   Procedure: ESOPHAGOGASTRODUODENOSCOPY (EGD) WITH PROPOFOL;  Surgeon: Daneil Dolin, MD;  [facility];  Service: Endoscopy;  Laterality: N/A;  . EYE SURGERY    . fracture lower back    . left elbow tendon repair    . NEUROPLASTY / TRANSPOSITION MEDIAN NERVE AT CARPAL TUNNEL BILATERAL    . plantar fasc bilateral    . right rotator cuff    . rt foot  reconstruction    . trigger thumb     Left    Family History  Problem Relation Age of Onset  . Breast cancer Daughter   . Heart disease Mother   . Hyperlipidemia Mother   . Hyperlipidemia Father   . Cancer Sister   . Birth defects Grandchild   . Anesthesia problems Neg Hx   . Hypotension Neg Hx   . Malignant hyperthermia Neg Hx   . Pseudochol deficiency Neg Hx   . Colon cancer Neg Hx     Social History Social History   Tobacco Use  . Smoking status: Current Some Day Smoker    Packs/day: 0.50    Years: 52.00    Pack years: 26.00     Types: Cigarettes  . Smokeless tobacco: Never Used  Substance Use Topics  . Alcohol use: Yes    Comment: rare  . Drug use: Yes    Frequency: 3.0 times per week    Types: Marijuana    Comment: not in a few months     Allergies  Allergen Reactions  . Codeine Itching  . Flonase [Fluticasone Propionate] Cough    Severe coughing  . Metformin And Related Diarrhea    Caused severe diarrhea and severe GI symptoms---even on low dose 500mg  BID    Current Outpatient Medications  Medication Sig Dispense Refill  . atorvastatin (LIPITOR) 80 MG tablet TAKE 1 TABLET EVERY DAY 90 tablet 1  . fluticasone (FLONASE) 50 MCG/ACT nasal spray USE 2 SPRAYS IN EACH NOSTRIL EVERY DAY 48 g 3  . glucose blood (ACCU-CHEK AVIVA PLUS) test strip TESTING TWICE DAILY. 100 each PRN  . HYDROcodone-acetaminophen (NORCO) 7.5-325 MG tablet TAKE 1 TABLET BY MOUTH EVERY 6 HOURS AS NEEDED FOR PAIN. 30 tablet 0  . HYDROcodone-acetaminophen (NORCO/VICODIN) 5-325 MG tablet One tablet every six hours for pain.  Limit 7 days. 28 tablet 0  . metoprolol succinate (TOPROL-XL) 25 MG 24 hr tablet Take 1 tablet (25 mg total) by mouth daily. 90 tablet 3  . pioglitazone (ACTOS) 15 MG tablet Take 15 mg by mouth daily.    Vladimir Faster Glycol-Propyl Glycol (SYSTANE OP) Apply 1 drop to eye 3 (three) times daily.    . predniSONE (DELTASONE) 20 MG tablet Take 3 tablets (60 mg total) by mouth daily with breakfast. 18 tablet 0  . predniSONE (DELTASONE) 5 MG tablet Take 3 tablets (15 mg total) by mouth daily with breakfast. 21 tablet 0  . SYMBICORT 160-4.5 MCG/ACT inhaler INHALE 2 PUFFS INTO THE LUNGS TWICE DAILY. 10.2 g 5  . traZODone (DESYREL) 50 MG tablet TAKE 3 TABLETS BY MOUTH AT BEDTIME AS NEEDED FOR SLEEP. 270 tablet 0  . venlafaxine XR (EFFEXOR-XR) 75 MG 24 hr capsule TAKE 2 CAPSULES EVERY DAY 180 capsule 1  . VENTOLIN HFA 108 (90 Base) MCG/ACT inhaler INHALE 2 PUFFS EVERY 4 TO 6 HOURS AS NEEDED FOR WHEEZING AND SHORTNESS OF BREATH. 18 g  0   No current facility-administered medications for this visit.     Physical Exam  Blood pressure (!) 153/82, pulse 93, height 5\' 5"  (1.651 m), weight 213 lb (96.6 kg).  Constitutional: overall normal hygiene, normal nutrition, well developed, normal grooming, normal body habitus. Assistive device:none  Musculoskeletal: gait and station Limp none, muscle tone and strength are normal, no tremors or atrophy is present.  .  Neurological: coordination overall normal.  Deep tendon reflex/nerve stretch intact.  Sensation normal.  Cranial nerves II-XII intact.   Skin:  Normal overall no scars, lesions, ulcers or rashes. No psoriasis.  Psychiatric: Alert and oriented x 3.  Recent memory intact, remote memory unclear.  Normal mood and affect. Well groomed.  Good eye contact.  Cardiovascular: overall no swelling, no varicosities, no edema bilaterally, normal temperatures of the legs and arms, no clubbing, cyanosis and good capillary refill.  Lymphatic: palpation is normal.  Spine/Pelvis examination:  Inspection:  Overall, sacoiliac joint benign and hips nontender; without crepitus or defects.   Thoracic spine inspection: Alignment normal without kyphosis present   Lumbar spine inspection:  Alignment  with normal lumbar lordosis, without scoliosis apparent.   Thoracic spine palpation:  without tenderness of spinal processes   Lumbar spine palpation: without tenderness of lumbar area; without tightness of lumbar muscles    Range of Motion:   Lumbar flexion, forward flexion is normal without pain or tenderness    Lumbar extension is full without pain or tenderness   Left lateral bend is normal without pain or tenderness   Right lateral bend is normal without pain or tenderness   Straight leg raising is normal  Strength & tone: normal   Stability overall normal stability Although she complains of marked lower back pain, she has good motion and normal NV status.  All other systems  reviewed and are negative   The patient has been educated about the nature of the problem(s) and counseled on treatment options.  The patient appeared to understand what I have discussed and is in agreement with it.  Encounter Diagnosis  Name Primary?  . Lumbar pain Yes   X-rays were done of the lumbar spine, reported separately.  PLAN Call if any problems.  Precautions discussed.  Continue current medications.   Return to clinic 2 weeks   Get MRI of the lumbar spine.  Electronically Signed Sanjuana Kava, MD 1/5/20213:19 PM

## 2019-01-28 ENCOUNTER — Telehealth: Payer: Self-pay | Admitting: Family Medicine

## 2019-01-28 MED ORDER — VENLAFAXINE HCL ER 75 MG PO CP24: 150.0000 mg | ORAL_CAPSULE | Freq: Every day | ORAL | 1 refills | Status: DC

## 2019-01-28 NOTE — Telephone Encounter (Signed)
Medication called/sent to requested pharmacy  

## 2019-01-28 NOTE — Telephone Encounter (Signed)
Patient called requesting refill on her effexor-xr 75 mg. She states [name] was supposed to have sent request last week. She is completely out of medication.  CB# [phone]

## 2019-02-03 ENCOUNTER — Other Ambulatory Visit: Payer: Self-pay | Admitting: Family Medicine

## 2019-02-16 ENCOUNTER — Other Ambulatory Visit: Payer: Self-pay | Admitting: Family Medicine

## 2019-02-17 ENCOUNTER — Ambulatory Visit: Payer: Medicare HMO | Admitting: Orthopaedic Surgery

## 2019-02-19 ENCOUNTER — Other Ambulatory Visit: Payer: Self-pay | Admitting: *Deleted

## 2019-02-19 MED ORDER — METOPROLOL SUCCINATE ER 25 MG PO TB24: 25.0000 mg | ORAL_TABLET | Freq: Every day | ORAL | 3 refills | Status: DC

## 2019-02-19 MED ORDER — PIOGLITAZONE HCL 15 MG PO TABS: 15.0000 mg | ORAL_TABLET | Freq: Every day | ORAL | 1 refills | Status: DC

## 2019-02-20 ENCOUNTER — Other Ambulatory Visit: Payer: Self-pay | Admitting: *Deleted

## 2019-02-20 MED ORDER — ACCU-CHEK AVIVA PLUS W/DEVICE KIT: PACK | 1 refills | Status: DC

## 2019-02-20 MED ORDER — ACCU-CHEK AVIVA PLUS VI STRP: ORAL_STRIP | 1 refills | Status: DC

## 2019-02-24 ENCOUNTER — Ambulatory Visit
Admission: RE | Admit: 2019-02-24 | Discharge: 2019-02-24 | Disposition: A | Discharge status: Home / Self Care | Payer: Medicare HMO | Source: Ambulatory Visit | Attending: Orthopaedic Surgery | Admitting: Orthopaedic Surgery

## 2019-02-24 ENCOUNTER — Other Ambulatory Visit: Payer: Self-pay

## 2019-02-24 DIAGNOSIS — M545 Low back pain, unspecified: Secondary | ICD-10-CM

## 2019-02-26 ENCOUNTER — Ambulatory Visit: Payer: Medicare HMO | Admitting: Orthopaedic Surgery

## 2019-03-03 ENCOUNTER — Encounter: Payer: Self-pay | Admitting: Orthopaedic Surgery

## 2019-03-03 ENCOUNTER — Ambulatory Visit: Payer: Medicare HMO | Admitting: Orthopaedic Surgery

## 2019-03-03 ENCOUNTER — Other Ambulatory Visit: Payer: Self-pay

## 2019-03-03 VITALS — BP 130/66 | HR 82 | Temp 97.2°F | Ht 65.0 in | Wt 213.0 lb

## 2019-03-03 DIAGNOSIS — M545 Low back pain, unspecified: Secondary | ICD-10-CM

## 2019-03-03 DIAGNOSIS — G8929 Other chronic pain: Secondary | ICD-10-CM | POA: Diagnosis not present

## 2019-03-03 MED ORDER — HYDROCODONE-ACETAMINOPHEN 5-325 MG PO TABS: ORAL_TABLET | ORAL | 0 refills | Status: DC

## 2019-03-03 NOTE — Progress Notes (Signed)
Patient ZO:XWRUE LOCHLYN Haney, female DOB:10/02/1945, 74 y.o. AVW:[id number]  Chief Complaint  Patient presents with  . Back Pain    Lumbar pain    HPI  [name] is a 74 y.o. female who has lower back pain and paresthesia on the left side.  She had MRI which showed: IMPRESSION: 1. Interval development of severe degenerative disc disease at T12-L1, L1-2 and L2-3. Small disc extrusion at L2-3 inferior and to the left might affect the left L3 nerve. 2. Interval solid interbody fusion and posterior decompression at L4-5 and L5-S1. 3. Slight progression of degenerative disc disease at L3-4 without focal neural impingement.  I have independently reviewed the MRI report.  I have explained the findings to her.  I have recommended she see her neurosurgeon for further evaluation and treatment.  She is agreeable to this.   Body mass index is 35.45 kg/m.  ROS  Review of Systems  Constitutional: Positive for activity change.  Respiratory: Positive for shortness of breath.   Musculoskeletal: Positive for arthralgias, back pain, gait problem and myalgias.  Psychiatric/Behavioral: The patient is nervous/anxious.   All other systems reviewed and are negative.   All other systems reviewed and are negative.  The following is a summary of the past history medically, past history surgically, known current medicines, social history and family history.  This information is gathered electronically by the computer from prior information and documentation.  I review this each visit and have found including this information at this point in the chart is beneficial and informative.    Past Medical History:  Diagnosis Date  . Allergy   . Anxiety   . Arthritis   . Asthma   . Bursitis   . Cataract   . COPD (chronic obstructive pulmonary disease) ([place])   . Depression   . Hiatal hernia   . Hypercholesteremia   . Hypertension   . Type 2 diabetes mellitus ([place])   . Ulcer     Past Surgical  History:  Procedure Laterality Date  . ABDOMINAL HYSTERECTOMY    . ANKLE ARTHROSCOPY WITH DRILLING/MICROFRACTURE Left 10/12/2015   Procedure: LEFT ANKLE ARTHROSCOPY, REMOVAL OF OSTEOCHONDRAL LESION WITH MICROFRACTURE;  Surgeon: Trula Slade, DPM;  Location: [facility];  Service: Podiatry;  Laterality: Left;  . BIOPSY  10/24/2016   Procedure: BIOPSY;  Surgeon: Daneil Dolin, MD;  [facility];  Service: Endoscopy;;  gastric esophageal  . CATARACT EXTRACTION W/PHACO  10/16/2010   Procedure: CATARACT EXTRACTION PHACO AND INTRAOCULAR LENS PLACEMENT ([place]);  Surgeon: Tonny Branch;  [facility]: [facility];  Service: Ophthalmology;  Laterality: Left;  CDE: 6.99  . CATARACT EXTRACTION W/PHACO  12/18/2010   Procedure: CATARACT EXTRACTION PHACO AND INTRAOCULAR LENS PLACEMENT ([facility]);  Surgeon: Tonny Branch;  [facility]: [facility];  Service: Ophthalmology;  Laterality: Right;  CDE 12.50  . COLONOSCOPY     about 5 years ago/[facility]  . COLONOSCOPY WITH PROPOFOL N/A 10/24/2016   Procedure: COLONOSCOPY WITH PROPOFOL;  Surgeon: Daneil Dolin, MD;  [facility];  Service: Endoscopy;  Laterality: N/A;  215  . ESOPHAGOGASTRODUODENOSCOPY  2003   Gastritis, path unavailable.  . ESOPHAGOGASTRODUODENOSCOPY (EGD) WITH PROPOFOL N/A 10/24/2016   Procedure: ESOPHAGOGASTRODUODENOSCOPY (EGD) WITH PROPOFOL;  Surgeon: Daneil Dolin, MD;  [facility];  Service: Endoscopy;  Laterality: N/A;  . EYE SURGERY    . fracture lower back    . left elbow tendon repair    . NEUROPLASTY / TRANSPOSITION MEDIAN  NERVE AT CARPAL TUNNEL BILATERAL    . plantar fasc bilateral    . right rotator cuff    . rt foot reconstruction    . trigger thumb     Left    Family History  Problem Relation Age of Onset  . Breast cancer Daughter   . Heart disease Mother   . Hyperlipidemia Mother   . Hyperlipidemia Father   . Cancer Sister   . Birth defects Grandchild   . Anesthesia problems Neg Hx   .  Hypotension Neg Hx   . Malignant hyperthermia Neg Hx   . Pseudochol deficiency Neg Hx   . Colon cancer Neg Hx     Social History Social History   Tobacco Use  . Smoking status: Current Some Day Smoker    Packs/day: 0.50    Years: 52.00    Pack years: 26.00    Types: Cigarettes  . Smokeless tobacco: Never Used  Substance Use Topics  . Alcohol use: Yes    Comment: rare  . Drug use: Yes    Frequency: 3.0 times per week    Types: Marijuana    Comment: not in a few months     Allergies  Allergen Reactions  . Codeine Itching  . Flonase [Fluticasone Propionate] Cough    Severe coughing  . Metformin And Related Diarrhea    Caused severe diarrhea and severe GI symptoms---even on low dose 571m BID    Current Outpatient Medications  Medication Sig Dispense Refill  . atorvastatin (LIPITOR) 80 MG tablet TAKE 1 TABLET EVERY DAY 90 tablet 1  . Blood Glucose Monitoring Suppl (ACCU-CHEK AVIVA PLUS) w/Device KIT Use as directed to monitor FSBS 2x daily. Dx: E11.9 1 kit 1  . fluticasone (FLONASE) 50 MCG/ACT nasal spray USE 2 SPRAYS IN EACH NOSTRIL EVERY DAY 48 g 3  . glucose blood (ACCU-CHEK AVIVA PLUS) test strip Use as directed to monitor FSBS 2x daily. Dx: E11.9 100 each 1  . HYDROcodone-acetaminophen (NORCO) 7.5-325 MG tablet TAKE 1 TABLET BY MOUTH EVERY 6 HOURS AS NEEDED FOR PAIN. 30 tablet 0  . HYDROcodone-acetaminophen (NORCO/VICODIN) 5-325 MG tablet One tablet every six hours for pain.  Limit 7 days. 28 tablet 0  . metoprolol succinate (TOPROL-XL) 25 MG 24 hr tablet Take 1 tablet (25 mg total) by mouth daily. 90 tablet 3  . pioglitazone (ACTOS) 15 MG tablet Take 1 tablet (15 mg total) by mouth daily. 90 tablet 1  . Polyethyl Glycol-Propyl Glycol (SYSTANE OP) Apply 1 drop to eye 3 (three) times daily.    . predniSONE (DELTASONE) 20 MG tablet Take 3 tablets (60 mg total) by mouth daily with breakfast. 18 tablet 0  . predniSONE (DELTASONE) 5 MG tablet Take 3 tablets (15 mg total) by  mouth daily with breakfast. 21 tablet 0  . SYMBICORT 160-4.5 MCG/ACT inhaler INHALE 2 PUFFS INTO THE LUNGS TWICE DAILY. 10.2 g 0  . traZODone (DESYREL) 50 MG tablet TAKE 3 TABLETS BY MOUTH AT BEDTIME AS NEEDED FOR SLEEP. 270 tablet 2  . venlafaxine XR (EFFEXOR-XR) 75 MG 24 hr capsule Take 2 capsules (150 mg total) by mouth daily. 180 capsule 1  . VENTOLIN HFA 108 (90 Base) MCG/ACT inhaler INHALE 2 PUFFS EVERY 4 TO 6 HOURS AS NEEDED FOR WHEEZING AND SHORTNESS OF BREATH. 18 g 0   No current facility-administered medications for this visit.     Physical Exam  Blood pressure 130/66, pulse 82, temperature (!) 97.2 F (36.2 C), height 5'  5" (1.651 m), weight 213 lb (96.6 kg).  Constitutional: overall normal hygiene, normal nutrition, well developed, normal grooming, normal body habitus. Assistive device:none  Musculoskeletal: gait and station Limp none, muscle tone and strength are normal, no tremors or atrophy is present.  .  Neurological: coordination overall normal.  Deep tendon reflex/nerve stretch intact.  Sensation normal.  Cranial nerves II-XII intact.   Skin:   Normal overall no scars, lesions, ulcers or rashes. No psoriasis.  Psychiatric: Alert and oriented x 3.  Recent memory intact, remote memory unclear.  Normal mood and affect. Well groomed.  Good eye contact.  Cardiovascular: overall no swelling, no varicosities, no edema bilaterally, normal temperatures of the legs and arms, no clubbing, cyanosis and good capillary refill.  Lymphatic: palpation is normal.  Spine/Pelvis examination:  Inspection:  Overall, sacoiliac joint benign and hips nontender; without crepitus or defects.   Thoracic spine inspection: Alignment normal without kyphosis present   Lumbar spine inspection:  Alignment  with normal lumbar lordosis, without scoliosis apparent.   Thoracic spine palpation:  without tenderness of spinal processes   Lumbar spine palpation: without tenderness of lumbar area;  without tightness of lumbar muscles    Range of Motion:   Lumbar flexion, forward flexion is normal without pain or tenderness    Lumbar extension is full without pain or tenderness   Left lateral bend is normal without pain or tenderness   Right lateral bend is normal without pain or tenderness   Straight leg raising is normal  Strength & tone: normal   Stability overall normal stability  All other systems reviewed and are negative   The patient has been educated about the nature of the problem(s) and counseled on treatment options.  The patient appeared to understand what I have discussed and is in agreement with it.  Encounter Diagnosis  Name Primary?  . Chronic left-sided low back pain, unspecified whether sciatica present Yes    PLAN Call if any problems.  Precautions discussed.  Continue current medications.   Return to clinic to neurosurgeon   I have reviewed the [facility] web site prior to prescribing narcotic medicine for this patient.   Electronically Signed Sanjuana Kava, MD 2/9/202111:06 AM

## 2019-03-05 ENCOUNTER — Other Ambulatory Visit: Payer: Self-pay | Admitting: Family Medicine

## 2019-03-05 DIAGNOSIS — J209 Acute bronchitis, unspecified: Secondary | ICD-10-CM

## 2019-03-16 ENCOUNTER — Other Ambulatory Visit: Payer: Self-pay

## 2019-03-16 ENCOUNTER — Ambulatory Visit (INDEPENDENT_AMBULATORY_CARE_PROVIDER_SITE_OTHER): Payer: Medicare HMO | Admitting: Nurse Practitioner

## 2019-03-16 DIAGNOSIS — J45901 Unspecified asthma with (acute) exacerbation: Secondary | ICD-10-CM | POA: Diagnosis not present

## 2019-03-16 DIAGNOSIS — J441 Chronic obstructive pulmonary disease with (acute) exacerbation: Secondary | ICD-10-CM | POA: Diagnosis not present

## 2019-03-16 DIAGNOSIS — J069 Acute upper respiratory infection, unspecified: Secondary | ICD-10-CM

## 2019-03-16 MED ORDER — AMOXICILLIN-POT CLAVULANATE 875-125 MG PO TABS: 1.0000 | ORAL_TABLET | Freq: Two times a day (BID) | ORAL | 0 refills | Status: AC

## 2019-03-16 MED ORDER — BENZONATATE 100 MG PO CAPS: 100.0000 mg | ORAL_CAPSULE | Freq: Two times a day (BID) | ORAL | 0 refills | Status: DC | PRN

## 2019-03-16 MED ORDER — PREDNISONE 10 MG PO TABS: ORAL_TABLET | ORAL | 0 refills | Status: DC

## 2019-03-16 NOTE — Progress Notes (Signed)
Virtual Visit via Telephone Note    I connected withWanda Haney on 03/16/2019 at 02:23PM by telephoneand verified that I am speaking with the correct person using two identifiers.  Pt location: at home   Physician location:  In office, [place], Ishmael Holter, FNP-C    On call: patient and physician   I discussed the limitations, risks, security and privacy concerns of performing an evaluation and management service by telephone and the availability of in person appointments. I also discussed with the patient that there may be a patient responsible charge related to this service. The patient expressed understanding and agreed to proceed.  Chief Complaint: lungs feel full, alot of coughing, head pounding, jaws hurts   History of Present Illness:   Pt is a 74 y.o. caucasian female who is an established pt of the clinic calling for sxs that started 2 weeks ago and have gradually become worse.  Her sxs are worse in AM, she gets rest and uses inhalers then sxs decrease, the back of her head feels congested like a cold, she becomes short of breath with exacerbation, her jaws bilateral feel sore from coughing so often, her cough is non productive, she has clear nasal drainage. She has tried no treatments. In fact today she has used her rescue inhaler once 2 hours ago. She lives alone and denied contact with others who have similar sxs. Denied contact with persons who have COVID. H/O asthma. Denies any fever/ chills/ bodyaches/ no vomiting or diarrhea, loss of smell or taste, cp/ct, gu/gi sxs, pain, or falls.   Time spent discussing proper use of inhalers. Pt v/l u/s. She also reports she just got her inhalers filled and denied need for refills.   Observations/Objective: NAD noted, able to speak in full sentences but very congested sounding, harsh cough, no wheeze heard   Assessment and Plan: Your sxs are consistent with upper respiratory infection with asthma  exacerbation.   1.I have sent to pharmacy [facility] for cough, Augmentin antibiotic, Prednisone taper dose. 2. You should use your inhalers as prescribed and reviewed on call today.  3. Call the number provided to schedule a COVID test today. 4. Go to ER for worsening sxs, follow up in clinic once negative result for COVID test if your sxs do not resolve or worsen.  Follow Up Instructions:  I discussed the assessment and treatment plan with the patient. The patient was provided an opportunity to ask questions and all were answered. The patient agreed with the plan and demonstrated an understanding of the instructions.  The patient was advised to call back or seek an in-person evaluation if the symptoms worsen or if the condition fails to improve as anticipated.  I provided 15 minutes of non-face-to-face time during this encounter. End Time 02:38PM   Ishmael Holter, FNP-C

## 2019-04-21 ENCOUNTER — Other Ambulatory Visit: Payer: Self-pay | Admitting: Family Medicine

## 2019-04-27 ENCOUNTER — Other Ambulatory Visit: Payer: Self-pay | Admitting: Family Medicine

## 2019-04-29 ENCOUNTER — Other Ambulatory Visit: Payer: Self-pay | Admitting: Family Medicine

## 2019-04-29 DIAGNOSIS — J209 Acute bronchitis, unspecified: Secondary | ICD-10-CM

## 2019-05-06 ENCOUNTER — Other Ambulatory Visit: Payer: Self-pay | Admitting: Family Medicine

## 2019-05-13 ENCOUNTER — Other Ambulatory Visit: Payer: Self-pay | Admitting: Family Medicine

## 2019-05-13 DIAGNOSIS — Z1231 Encounter for screening mammogram for malignant neoplasm of breast: Secondary | ICD-10-CM

## 2019-05-18 ENCOUNTER — Other Ambulatory Visit: Payer: Self-pay

## 2019-05-18 ENCOUNTER — Ambulatory Visit: Payer: Medicare HMO | Attending: Internal Medicine

## 2019-05-18 DIAGNOSIS — Z20822 Contact with and (suspected) exposure to covid-19: Secondary | ICD-10-CM

## 2019-05-19 LAB — SARS-COV-2, NAA 2 DAY TAT

## 2019-05-19 LAB — NOVEL CORONAVIRUS, NAA: SARS-CoV-2, NAA: NOT DETECTED

## 2019-05-21 ENCOUNTER — Encounter: Payer: Self-pay | Admitting: Family Medicine

## 2019-05-21 ENCOUNTER — Other Ambulatory Visit: Payer: Self-pay

## 2019-05-21 ENCOUNTER — Ambulatory Visit (INDEPENDENT_AMBULATORY_CARE_PROVIDER_SITE_OTHER): Payer: Medicare HMO | Admitting: Family Medicine

## 2019-05-21 VITALS — BP 128/68 | HR 90 | Temp 97.9°F | Resp 16 | Ht 65.0 in | Wt 214.0 lb

## 2019-05-21 DIAGNOSIS — D72828 Other elevated white blood cell count: Secondary | ICD-10-CM | POA: Diagnosis not present

## 2019-05-21 DIAGNOSIS — M797 Fibromyalgia: Secondary | ICD-10-CM | POA: Diagnosis not present

## 2019-05-21 DIAGNOSIS — J301 Allergic rhinitis due to pollen: Secondary | ICD-10-CM

## 2019-05-21 MED ORDER — MONTELUKAST SODIUM 10 MG PO TABS
10.0000 mg | ORAL_TABLET | Freq: Every day | ORAL | 3 refills | Status: DC
Start: 2019-05-21 — End: 2020-04-28

## 2019-05-21 MED ORDER — BENZONATATE 100 MG PO CAPS: 100.0000 mg | ORAL_CAPSULE | Freq: Two times a day (BID) | ORAL | 0 refills | Status: DC | PRN

## 2019-05-21 MED ORDER — VENLAFAXINE HCL ER 75 MG PO CP24: 150.0000 mg | ORAL_CAPSULE | Freq: Every day | ORAL | 1 refills | Status: DC

## 2019-05-21 MED ORDER — PREGABALIN 50 MG PO CAPS: 50.0000 mg | ORAL_CAPSULE | Freq: Three times a day (TID) | ORAL | 1 refills | Status: DC

## 2019-05-21 NOTE — Progress Notes (Signed)
Subjective:    Patient ID: [name], female    DOB: 06/21/1945, 74 y.o.   MRN: [id number]  HPI  11/20 Patient is here today requesting a evaluation for fibromyalgia.  She states that she hurts all over.  This is steadily been worsening throughout the year.  She states that she hurts in her shoulders.  She states that she tore her rotator cuff in the right shoulder in the past.  However now she is unable to lift either arm greater than 90 degrees.  The ache and throb constantly in the deltoid and trapezius areas.  She also reports pain in her gluteus muscles bilaterally as well as her hamstrings and quadriceps.  She states that her muscles are getting increasingly weak and hurt simply to touch.  She reports near constant pain in her knees and in her feet.  She is on Lipitor 80 mg a day.  However she has taken this for a long time.  She denies any fevers or chills.  She denies any pain over the temporal artery.  She denies any headache or blurry vision.  She has no history of PMR.  She has no him prior history of temporal arteritis.  She has no history of any autoimmune diseases.  She denies any pain in her hands.  She does report pain in her elbows and in her wrist.  However the majority of the pain is in her hips and in her shoulders.  At that time, my plan was:  am concerned about statin induced myopathy.  I have recommended that she temporarily discontinue Lipitor and over the next 1 to 2 weeks see if the pain improves.  I will check a CK level to evaluate for any evidence of rhabdomyolysis.  Also given her age, the patient would be at risk for polymyalgia rheumatica.  I will check a sedimentation rate particular given the fact that the pain seems to center in her shoulder and her hip area.  Autoimmune diseases are also a possibility and therefore I will check a sed rate along with an ANA and a rheumatoid factor.  Additionally I will check a CMP to monitor her alkaline phosphatase for any evidence  of increased bone turnover as well as check a CBC to evaluate for any bone marrow abnormalities.  If lab work is completely normal and pain improves if she holds Lipitor, we will need to find another option for her cholesterol.  If labs are completely normal and pain does not improve with Lipitor, I would recommend x-rays of the involved areas to evaluate for osteoarthritis and if these are negative, certainly the patient could have a diagnosis of fibromyalgia.  However I explained to the patient that this is a process of illumination rather than a test to confirm fibromyalgia.  I did temporarily refill her hydrocodone which she uses sparingly for pain.  The patient was given 30 tablets  05/21/19 On the patient's labs, her sedimentation rate was elevated.  I tried the patient empirically on prednisone 15 mg a day for possible PMR.  She saw no benefit with regards to her pain.  Therefore I believe she most likely have fibromyalgia however the patient has not been seen since December.  She presents today complaining of pain all over her body.  She seems very upset and frustrated from the very outset of our encounter.  She states that I need to do something to help her pain.  She hurts all over her body.  She states that she cannot stand the pain.  She complains of pain in her arms and in her hands and in her hips and her legs.  She also reports trouble breathing.  She states that she has a lot of sinus congestion and rhinorrhea.  She is taking over-the-counter allergy medication along with Flonase with no relief.  She also has a history of asthma and continues to smoke although she smokes sparingly.  She denies any fever or chills or hemoptysis or purulent sputum Past Medical History:  Diagnosis Date  . Allergy   . Anxiety   . Arthritis   . Asthma   . Bursitis   . Cataract   . COPD (chronic obstructive pulmonary disease) ([place])   . Depression   . Hiatal hernia   . Hypercholesteremia   . Hypertension   .  Type 2 diabetes mellitus ([place])   . Ulcer    Past Surgical History:  Procedure Laterality Date  . ABDOMINAL HYSTERECTOMY    . ANKLE ARTHROSCOPY WITH DRILLING/MICROFRACTURE Left 10/12/2015   Procedure: LEFT ANKLE ARTHROSCOPY, REMOVAL OF OSTEOCHONDRAL LESION WITH MICROFRACTURE;  Surgeon: Trula Slade, DPM;  Location: [facility];  Service: Podiatry;  Laterality: Left;  . BIOPSY  10/24/2016   Procedure: BIOPSY;  Surgeon: Daneil Dolin, MD;  [facility];  Service: Endoscopy;;  gastric esophageal  . CATARACT EXTRACTION W/PHACO  10/16/2010   Procedure: CATARACT EXTRACTION PHACO AND INTRAOCULAR LENS PLACEMENT ([place]);  Surgeon: Tonny Branch;  [facility];  Service: Ophthalmology;  Laterality: Left;  CDE: 6.99  . CATARACT EXTRACTION W/PHACO  12/18/2010   Procedure: CATARACT EXTRACTION PHACO AND INTRAOCULAR LENS PLACEMENT ([facility]);  Surgeon: Tonny Branch;  [facility];  Service: Ophthalmology;  Laterality: Right;  CDE 12.50  . COLONOSCOPY     about 5 years ago/[facility]  . COLONOSCOPY WITH PROPOFOL N/A 10/24/2016   Procedure: COLONOSCOPY WITH PROPOFOL;  Surgeon: Daneil Dolin, MD;  [facility];  Service: Endoscopy;  Laterality: N/A;  215  . ESOPHAGOGASTRODUODENOSCOPY  2003   Gastritis, path unavailable.  . ESOPHAGOGASTRODUODENOSCOPY (EGD) WITH PROPOFOL N/A 10/24/2016   Procedure: ESOPHAGOGASTRODUODENOSCOPY (EGD) WITH PROPOFOL;  Surgeon: Daneil Dolin, MD;  [facility];  Service: Endoscopy;  Laterality: N/A;  . EYE SURGERY    . fracture lower back    . left elbow tendon repair    . NEUROPLASTY / TRANSPOSITION MEDIAN NERVE AT CARPAL TUNNEL BILATERAL    . plantar fasc bilateral    . right rotator cuff    . rt foot reconstruction    . trigger thumb     Left   Current Outpatient Medications on File Prior to Visit  Medication Sig Dispense Refill  . atorvastatin (LIPITOR) 80 MG tablet TAKE 1 TABLET EVERY DAY 90 tablet 1  . Blood Glucose  Monitoring Suppl (ACCU-CHEK AVIVA PLUS) w/Device KIT Use as directed to monitor FSBS 2x daily. Dx: E11.9 1 kit 1  . fluticasone (FLONASE) 50 MCG/ACT nasal spray USE 2 SPRAYS IN EACH NOSTRIL EVERY DAY 48 g 3  . glucose blood (ACCU-CHEK AVIVA PLUS) test strip USE AS DIRECTED TO MONITOR FINGER STICK BLOOD SUGAR 2 TIMES DAILY 200 strip 3  . metoprolol succinate (TOPROL-XL) 25 MG 24 hr tablet Take 1 tablet (25 mg total) by mouth daily. 90 tablet 3  . pioglitazone (ACTOS) 15 MG tablet Take 1 tablet (15 mg total) by mouth daily. 90 tablet 1  . Polyethyl Glycol-Propyl Glycol (SYSTANE OP)  Apply 1 drop to eye 3 (three) times daily.    . SYMBICORT 160-4.5 MCG/ACT inhaler INHALE 2 PUFFS INTO THE LUNGS TWICE DAILY. 10.2 g 5  . traZODone (DESYREL) 50 MG tablet TAKE 3 TABLETS BY MOUTH AT BEDTIME AS NEEDED FOR SLEEP. 270 tablet 2  . VENTOLIN HFA 108 (90 Base) MCG/ACT inhaler INHALE 2 PUFFS EVERY 4 TO 6 HOURS AS NEEDED FOR WHEEZING AND SHORTNESS OF BREATH. 18 g 0  . HYDROcodone-acetaminophen (NORCO/VICODIN) 5-325 MG tablet One tablet every six hours for pain.  Limit 7 days. (Patient not taking: Reported on 05/21/2019) 28 tablet 0   No current facility-administered medications on file prior to visit.   Allergies  Allergen Reactions  . Codeine Itching  . Flonase [Fluticasone Propionate] Cough    Severe coughing  . Metformin And Related Diarrhea    Caused severe diarrhea and severe GI symptoms---even on low dose 533m BID   Social History   Socioeconomic History  . Marital status: Single    Spouse name: Not on file  . Number of children: Not on file  . Years of education: Not on file  . Highest education level: Not on file  Occupational History  . Not on file  Tobacco Use  . Smoking status: Current Some Day Smoker    Packs/day: 0.50    Years: 52.00    Pack years: 26.00    Types: Cigarettes  . Smokeless tobacco: Never Used  Substance and Sexual Activity  . Alcohol use: Yes    Comment: rare  . Drug  use: Yes    Frequency: 3.0 times per week    Types: Marijuana    Comment: not in a few months   . Sexual activity: Not Currently    Birth control/protection: Surgical  Other Topics Concern  . Not on file  Social History Narrative  . Not on file   Social Determinants of Health   Financial Resource Strain:   . Difficulty of Paying Living Expenses:   Food Insecurity:   . Worried About RCharity fundraiserin the Last Year:   . RArboriculturistin the Last Year:   Transportation Needs:   . LFilm/video editor(Medical):   .Marland KitchenLack of Transportation (Non-Medical):   Physical Activity:   . Days of Exercise per Week:   . Minutes of Exercise per Session:   Stress:   . Feeling of Stress :   Social Connections:   . Frequency of Communication with Friends and Family:   . Frequency of Social Gatherings with Friends and Family:   . Attends Religious Services:   . Active Member of Clubs or Organizations:   . Attends CArchivistMeetings:   .Marland KitchenMarital Status:   Intimate Partner Violence:   . Fear of Current or Ex-Partner:   . Emotionally Abused:   .Marland KitchenPhysically Abused:   . Sexually Abused:       Review of Systems  All other systems reviewed and are negative.      Objective:   Physical Exam Vitals reviewed.  Constitutional:      General: She is not in acute distress.    Appearance: Normal appearance. She is obese. She is not ill-appearing or toxic-appearing.  Cardiovascular:     Rate and Rhythm: Normal rate and regular rhythm.     Heart sounds: Murmur present.  Pulmonary:     Effort: Pulmonary effort is normal. No respiratory distress.     Breath sounds: Normal breath  sounds. No stridor. No wheezing or rhonchi.  Musculoskeletal:     Right shoulder: Tenderness present. Decreased range of motion. Decreased strength.     Left shoulder: Tenderness present. Decreased range of motion.     Right hip: Tenderness present. Decreased range of motion.     Left hip: Tenderness  present. Decreased range of motion.     Right upper leg: Tenderness present.     Left upper leg: Tenderness present.     Right lower leg: No edema.     Left lower leg: No edema.  Neurological:     Mental Status: She is alert.           Assessment & Plan:  Other elevated white blood cell (WBC) count - Plan: CBC with Differential/Platelet, COMPLETE METABOLIC PANEL WITH GFR, Sedimentation rate  Fibromyalgia - Plan: benzonatate (TESSALON) 100 MG capsule  Seasonal allergic rhinitis due to pollen - Plan: benzonatate (TESSALON) 100 MG capsule  In the fall, the patient's white blood cell count and sedimentation rate were elevated raising the concern about possible autoimmune disease.  However she saw no benefit from the prednisone.  Therefore I will repeat a CBC and a sedimentation rate to see if this is persistent.  However I believe the majority of her pain is secondary to fibromyalgia.  Patient seems extremely frustrated.  She simply wants the pain to go away.  I explained to the patient that I do not believe I will be able to make the pain go away completely.  However I would like to treat the patient's fibromyalgia to try to make the pain more bearable so that she can better function in her day-to-day routine.  I will start the patient on Lyrica 50 mg 3 times a day.  I see no evidence of an asthma exacerbation today on her exam.  Her lungs are clear.  There is no significant sinus pain or rhinorrhea although she does have some clear mucus in her nasal passages.  Given her history of asthma I will add Singulair 10 mg a day for possible allergic sinusitis/rhinitis.

## 2019-05-22 LAB — COMPLETE METABOLIC PANEL WITH GFR
AG Ratio: 1.4 (calc) (ref 1.0–2.5)
ALT: 19 U/L (ref 6–29)
AST: 25 U/L (ref 10–35)
Albumin: 4.2 g/dL (ref 3.6–5.1)
Alkaline phosphatase (APISO): 127 U/L (ref 37–153)
BUN/Creatinine Ratio: 16 (calc) (ref 6–22)
BUN: 17 mg/dL (ref 7–25)
CO2: 30 mmol/L (ref 20–32)
Calcium: 10 mg/dL (ref 8.6–10.4)
Chloride: 102 mmol/L (ref 98–110)
Creat: 1.07 mg/dL — ABNORMAL HIGH (ref 0.60–0.93)
GFR, Est African American: 60 mL/min/{1.73_m2} (ref >=60)
GFR, Est Non African American: 52 mL/min/{1.73_m2} — ABNORMAL LOW (ref >=60)
Globulin: 2.9 g/dL (calc) (ref 1.9–3.7)
Glucose, Bld: 79 mg/dL (ref 65–99)
Potassium: 5.5 mmol/L — ABNORMAL HIGH (ref 3.5–5.3)
Sodium: 141 mmol/L (ref 135–146)
Total Bilirubin: 0.3 mg/dL (ref 0.2–1.2)
Total Protein: 7.1 g/dL (ref 6.1–8.1)

## 2019-05-22 LAB — CBC WITH DIFFERENTIAL/PLATELET
Absolute Monocytes: 819 cells/uL (ref 200–950)
Basophils Absolute: 65 cells/uL (ref 0–200)
Basophils Relative: 0.5 %
Eosinophils Absolute: 260 cells/uL (ref 15–500)
Eosinophils Relative: 2 %
HCT: 40.5 % (ref 35.0–45.0)
Hemoglobin: 13.3 g/dL (ref 11.7–15.5)
Lymphs Abs: 4472 cells/uL — ABNORMAL HIGH (ref 850–3900)
MCH: 29.3 pg (ref 27.0–33.0)
MCHC: 32.8 g/dL (ref 32.0–36.0)
MCV: 89.2 fL (ref 80.0–100.0)
MPV: 10.9 fL (ref 7.5–12.5)
Monocytes Relative: 6.3 %
Neutro Abs: 7384 cells/uL (ref 1500–7800)
Neutrophils Relative %: 56.8 %
Platelets: 388 10*3/uL (ref 140–400)
RBC: 4.54 10*6/uL (ref 3.80–5.10)
RDW: 13.6 % (ref 11.0–15.0)
Total Lymphocyte: 34.4 %
WBC: 13 10*3/uL — ABNORMAL HIGH (ref 3.8–10.8)

## 2019-05-22 LAB — SEDIMENTATION RATE: Sed Rate: 79 mm/h — ABNORMAL HIGH (ref 0–30)

## 2019-06-04 ENCOUNTER — Ambulatory Visit: Payer: Medicare HMO

## 2019-06-08 ENCOUNTER — Other Ambulatory Visit: Payer: Self-pay | Admitting: Family Medicine

## 2019-06-08 DIAGNOSIS — J209 Acute bronchitis, unspecified: Secondary | ICD-10-CM

## 2019-06-10 ENCOUNTER — Other Ambulatory Visit: Payer: Self-pay | Admitting: Family Medicine

## 2019-06-10 DIAGNOSIS — M797 Fibromyalgia: Secondary | ICD-10-CM

## 2019-06-10 DIAGNOSIS — J301 Allergic rhinitis due to pollen: Secondary | ICD-10-CM

## 2019-07-13 DIAGNOSIS — M5136 Other intervertebral disc degeneration, lumbar region: Secondary | ICD-10-CM | POA: Diagnosis not present

## 2019-07-13 DIAGNOSIS — M5416 Radiculopathy, lumbar region: Secondary | ICD-10-CM | POA: Diagnosis not present

## 2019-07-13 DIAGNOSIS — Z6835 Body mass index (BMI) 35.0-35.9, adult: Secondary | ICD-10-CM | POA: Diagnosis not present

## 2019-07-13 DIAGNOSIS — I10 Essential (primary) hypertension: Secondary | ICD-10-CM | POA: Diagnosis not present

## 2019-07-13 DIAGNOSIS — M47816 Spondylosis without myelopathy or radiculopathy, lumbar region: Secondary | ICD-10-CM | POA: Diagnosis not present

## 2019-07-29 ENCOUNTER — Other Ambulatory Visit: Payer: Self-pay | Admitting: Family Medicine

## 2019-09-03 ENCOUNTER — Ambulatory Visit: Payer: Medicare HMO | Admitting: Family Medicine

## 2019-09-07 ENCOUNTER — Ambulatory Visit (INDEPENDENT_AMBULATORY_CARE_PROVIDER_SITE_OTHER): Payer: Medicare HMO | Admitting: Family Medicine

## 2019-09-07 ENCOUNTER — Other Ambulatory Visit: Payer: Self-pay

## 2019-09-07 VITALS — BP 130/68 | HR 70 | Temp 97.8°F | Ht 65.0 in | Wt 215.0 lb

## 2019-09-07 DIAGNOSIS — M797 Fibromyalgia: Secondary | ICD-10-CM | POA: Diagnosis not present

## 2019-09-07 DIAGNOSIS — E119 Type 2 diabetes mellitus without complications: Secondary | ICD-10-CM | POA: Diagnosis not present

## 2019-09-07 DIAGNOSIS — M2550 Pain in unspecified joint: Secondary | ICD-10-CM | POA: Diagnosis not present

## 2019-09-07 DIAGNOSIS — J449 Chronic obstructive pulmonary disease, unspecified: Secondary | ICD-10-CM | POA: Diagnosis not present

## 2019-09-07 DIAGNOSIS — D72828 Other elevated white blood cell count: Secondary | ICD-10-CM

## 2019-09-07 NOTE — Progress Notes (Signed)
Subjective:    Patient ID: [name], female    DOB: Jan 03, 1946, 74 y.o.   MRN: [id number]  HPI  11/20 Patient is here today requesting a evaluation for fibromyalgia.  She states that she hurts all over.  This is steadily been worsening throughout the year.  She states that she hurts in her shoulders.  She states that she tore her rotator cuff in the right shoulder in the past.  However now she is unable to lift either arm greater than 90 degrees.  The ache and throb constantly in the deltoid and trapezius areas.  She also reports pain in her gluteus muscles bilaterally as well as her hamstrings and quadriceps.  She states that her muscles are getting increasingly weak and hurt simply to touch.  She reports near constant pain in her knees and in her feet.  She is on Lipitor 80 mg a day.  However she has taken this for a long time.  She denies any fevers or chills.  She denies any pain over the temporal artery.  She denies any headache or blurry vision.  She has no history of PMR.  She has no him prior history of temporal arteritis.  She has no history of any autoimmune diseases.  She denies any pain in her hands.  She does report pain in her elbows and in her wrist.  However the majority of the pain is in her hips and in her shoulders.  At that time, my plan was:  am concerned about statin induced myopathy.  I have recommended that she temporarily discontinue Lipitor and over the next 1 to 2 weeks see if the pain improves.  I will check a CK level to evaluate for any evidence of rhabdomyolysis.  Also given her age, the patient would be at risk for polymyalgia rheumatica.  I will check a sedimentation rate particular given the fact that the pain seems to center in her shoulder and her hip area.  Autoimmune diseases are also a possibility and therefore I will check a sed rate along with an ANA and a rheumatoid factor.  Additionally I will check a CMP to monitor her alkaline phosphatase for any evidence  of increased bone turnover as well as check a CBC to evaluate for any bone marrow abnormalities.  If lab work is completely normal and pain improves if she holds Lipitor, we will need to find another option for her cholesterol.  If labs are completely normal and pain does not improve with Lipitor, I would recommend x-rays of the involved areas to evaluate for osteoarthritis and if these are negative, certainly the patient could have a diagnosis of fibromyalgia.  However I explained to the patient that this is a process of illumination rather than a test to confirm fibromyalgia.  I did temporarily refill her hydrocodone which she uses sparingly for pain.  The patient was given 30 tablets  05/21/19 On the patient's labs, her sedimentation rate was elevated.  I tried the patient empirically on prednisone 15 mg a day for possible PMR.  She saw no benefit with regards to her pain.  Therefore I believe she most likely have fibromyalgia however the patient has not been seen since December.  She presents today complaining of pain all over her body.  She seems very upset and frustrated from the very outset of our encounter.  She states that I need to do something to help her pain.  She hurts all over her body.  She states that she cannot stand the pain.  She complains of pain in her arms and in her hands and in her hips and her legs.  She also reports trouble breathing.  She states that she has a lot of sinus congestion and rhinorrhea.  She is taking over-the-counter allergy medication along with Flonase with no relief.  She also has a history of asthma and continues to smoke although she smokes sparingly.  She denies any fever or chills or hemoptysis or purulent sputum.  At that time, my plan was: In the fall, the patient's white blood cell count and sedimentation rate were elevated raising the concern about possible autoimmune disease.  However she saw no benefit from the prednisone.  Therefore I will repeat a CBC and a  sedimentation rate to see if this is persistent.  However I believe the majority of her pain is secondary to fibromyalgia.  Patient seems extremely frustrated.  She simply wants the pain to go away.  I explained to the patient that I do not believe I will be able to make the pain go away completely.  However I would like to treat the patient's fibromyalgia to try to make the pain more bearable so that she can better function in her day-to-day routine.  I will start the patient on Lyrica 50 mg 3 times a day.  I see no evidence of an asthma exacerbation today on her exam.  Her lungs are clear.  There is no significant sinus pain or rhinorrhea although she does have some clear mucus in her nasal passages.  Given her history of asthma I will add Singulair 10 mg a day for possible allergic sinusitis/rhinitis.  09/07/19 White blood cell count was still greater than 13 and sed rate was still elevated at greater than 70.  We started the patient on Lyrica and I requested to see her back in 2 weeks to see how she was doing however the patient has not followed up until today.  She is unable to afford the Lyrica however she states that the Lyrica 50 mg 3 times a day does help with her pain and makes it more bearable.  She has never tried gabapentin in the past.  She continues to report that she aches all over her body.  Despite a negative autoimmune work-up thus far, the patient does still have an elevated white blood cell count and elevated sed rate that I have yet to be able to explain.  Patient is overdue for fasting lab work.  She has a history of diabetes currently managed with Actos.  She reports fasting blood sugars between 101 117.  She denies any blood sugars greater than 200.  She denies any polyuria, polydipsia, or blurry vision.  She denies any chest pain shortness of breath or dyspnea on exertion.  She is due to recheck a fasting lipid panel.  She has yet to have her Covid vaccination Past Medical History:   Diagnosis Date  . Allergy   . Anxiety   . Arthritis   . Asthma   . Bursitis   . Cataract   . COPD (chronic obstructive pulmonary disease) ([place])   . Depression   . Hiatal hernia   . Hypercholesteremia   . Hypertension   . Type 2 diabetes mellitus ([place])   . Ulcer    Past Surgical History:  Procedure Laterality Date  . ABDOMINAL HYSTERECTOMY    . ANKLE ARTHROSCOPY WITH DRILLING/MICROFRACTURE Left 10/12/2015   Procedure: LEFT ANKLE  ARTHROSCOPY, REMOVAL OF OSTEOCHONDRAL LESION WITH MICROFRACTURE;  Surgeon: Trula Slade, DPM;  Location: [facility];  Service: Podiatry;  Laterality: Left;  . BIOPSY  10/24/2016   Procedure: BIOPSY;  Surgeon: Daneil Dolin, MD;  [facility];  Service: Endoscopy;;  gastric esophageal  . CATARACT EXTRACTION W/PHACO  10/16/2010   Procedure: CATARACT EXTRACTION PHACO AND INTRAOCULAR LENS PLACEMENT ([place]);  Surgeon: Tonny Branch;  Location: [facility];  Service: Ophthalmology;  Laterality: Left;  CDE: 6.99  . CATARACT EXTRACTION W/PHACO  12/18/2010   Procedure: CATARACT EXTRACTION PHACO AND INTRAOCULAR LENS PLACEMENT ([facility]);  Surgeon: Tonny Branch;  Location: [facility];  Service: Ophthalmology;  Laterality: Right;  CDE 12.50  . COLONOSCOPY     about 5 years ago/[facility]  . COLONOSCOPY WITH PROPOFOL N/A 10/24/2016   Procedure: COLONOSCOPY WITH PROPOFOL;  Surgeon: Daneil Dolin, MD;  [facility];  Service: Endoscopy;  Laterality: N/A;  215  . ESOPHAGOGASTRODUODENOSCOPY  2003   Gastritis, path unavailable.  . ESOPHAGOGASTRODUODENOSCOPY (EGD) WITH PROPOFOL N/A 10/24/2016   Procedure: ESOPHAGOGASTRODUODENOSCOPY (EGD) WITH PROPOFOL;  Surgeon: Daneil Dolin, MD;  [facility];  Service: Endoscopy;  Laterality: N/A;  . EYE SURGERY    . fracture lower back    . left elbow tendon repair    . NEUROPLASTY / TRANSPOSITION MEDIAN NERVE AT CARPAL TUNNEL BILATERAL    . plantar fasc bilateral    . right rotator cuff    . rt foot  reconstruction    . trigger thumb     Left   Current Outpatient Medications on File Prior to Visit  Medication Sig Dispense Refill  . atorvastatin (LIPITOR) 80 MG tablet TAKE 1 TABLET EVERY DAY 90 tablet 1  . benzonatate (TESSALON) 100 MG capsule TAKE 1 CAPSULE (100 MG TOTAL) BY MOUTH 2 (TWO) TIMES DAILY AS NEEDED FOR COUGH. 60 capsule 0  . Blood Glucose Monitoring Suppl (ACCU-CHEK AVIVA PLUS) w/Device KIT Use as directed to monitor FSBS 2x daily. Dx: E11.9 1 kit 1  . fluticasone (FLONASE) 50 MCG/ACT nasal spray USE 2 SPRAYS IN EACH NOSTRIL EVERY DAY 48 g 3  . glucose blood (ACCU-CHEK AVIVA PLUS) test strip USE AS DIRECTED TO MONITOR FINGER STICK BLOOD SUGAR 2 TIMES DAILY 200 strip 3  . HYDROcodone-acetaminophen (NORCO/VICODIN) 5-325 MG tablet One tablet every six hours for pain.  Limit 7 days. (Patient not taking: Reported on 05/21/2019) 28 tablet 0  . metoprolol succinate (TOPROL-XL) 25 MG 24 hr tablet Take 1 tablet (25 mg total) by mouth daily. 90 tablet 3  . montelukast (SINGULAIR) 10 MG tablet Take 1 tablet (10 mg total) by mouth at bedtime. 90 tablet 3  . pioglitazone (ACTOS) 15 MG tablet TAKE 1 TABLET EVERY DAY 90 tablet 1  . Polyethyl Glycol-Propyl Glycol (SYSTANE OP) Apply 1 drop to eye 3 (three) times daily.    . pregabalin (LYRICA) 50 MG capsule Take 1 capsule (50 mg total) by mouth 3 (three) times daily. 270 capsule 1  . SYMBICORT 160-4.5 MCG/ACT inhaler INHALE 2 PUFFS INTO THE LUNGS TWICE DAILY. 10.2 g 5  . traZODone (DESYREL) 50 MG tablet TAKE 3 TABLETS BY MOUTH AT BEDTIME AS NEEDED FOR SLEEP. 270 tablet 2  . venlafaxine XR (EFFEXOR-XR) 75 MG 24 hr capsule Take 2 capsules (150 mg total) by mouth daily. 180 capsule 1  . VENTOLIN HFA 108 (90 Base) MCG/ACT inhaler INHALE 2 PUFFS EVERY 4 TO 6 HOURS AS NEEDED FOR WHEEZING AND  SHORTNESS OF BREATH. 18 g 0   No current facility-administered medications on file prior to visit.   Allergies  Allergen Reactions  . Codeine Itching  .  Flonase [Fluticasone Propionate] Cough    Severe coughing  . Metformin And Related Diarrhea    Caused severe diarrhea and severe GI symptoms---even on low dose 556m BID   Social History   Socioeconomic History  . Marital status: Single    Spouse name: Not on file  . Number of children: Not on file  . Years of education: Not on file  . Highest education level: Not on file  Occupational History  . Not on file  Tobacco Use  . Smoking status: Current Some Day Smoker    Packs/day: 0.50    Years: 52.00    Pack years: 26.00    Types: Cigarettes  . Smokeless tobacco: Never Used  Vaping Use  . Vaping Use: Never used  Substance and Sexual Activity  . Alcohol use: Yes    Comment: rare  . Drug use: Yes    Frequency: 3.0 times per week    Types: Marijuana    Comment: not in a few months   . Sexual activity: Not Currently    Birth control/protection: Surgical  Other Topics Concern  . Not on file  Social History Narrative  . Not on file   Social Determinants of Health   Financial Resource Strain:   . Difficulty of Paying Living Expenses:   Food Insecurity:   . Worried About RCharity fundraiserin the Last Year:   . RArboriculturistin the Last Year:   Transportation Needs:   . LFilm/video editor(Medical):   .Marland KitchenLack of Transportation (Non-Medical):   Physical Activity:   . Days of Exercise per Week:   . Minutes of Exercise per Session:   Stress:   . Feeling of Stress :   Social Connections:   . Frequency of Communication with Friends and Family:   . Frequency of Social Gatherings with Friends and Family:   . Attends Religious Services:   . Active Member of Clubs or Organizations:   . Attends CArchivistMeetings:   .Marland KitchenMarital Status:   Intimate Partner Violence:   . Fear of Current or Ex-Partner:   . Emotionally Abused:   .Marland KitchenPhysically Abused:   . Sexually Abused:       Review of Systems  All other systems reviewed and are negative.      Objective:    Physical Exam Vitals reviewed.  Constitutional:      General: She is not in acute distress.    Appearance: Normal appearance. She is obese. She is not ill-appearing or toxic-appearing.  Cardiovascular:     Rate and Rhythm: Normal rate and regular rhythm.     Heart sounds: Murmur heard.   Pulmonary:     Effort: Pulmonary effort is normal. No respiratory distress.     Breath sounds: Normal breath sounds. No stridor. No wheezing or rhonchi.  Musculoskeletal:     Right shoulder: Tenderness present. Decreased range of motion. Decreased strength.     Left shoulder: Tenderness present. Decreased range of motion.     Right hip: Tenderness present. Decreased range of motion.     Left hip: Tenderness present. Decreased range of motion.     Right upper leg: Tenderness present.     Left upper leg: Tenderness present.     Right lower leg: No edema.  Left lower leg: No edema.  Neurological:     Mental Status: She is alert.           Assessment & Plan:  Diabetes mellitus without complication ([place]) - Plan: Hemoglobin A1c, COMPLETE METABOLIC PANEL WITH GFR, Lipid panel, Microalbumin, urine  Fibromyalgia  Other elevated white blood cell (WBC) count  Polyarthralgia  Although the patient seemed to respond to the Lyrica, I question if the patient may have PMR given the high sedimentation rate.  Therefore, I will try switching the patient to gabapentin 300 mg 3 times a day and consult rheumatology to evaluate for possible PMR.  Meanwhile I will check an A1c, CMP, fasting lipid panel, and urine microalbumin to monitor her diabetes.  I would appreciate rheumatology's input on PMR prior to starting her on steroids given the fact she has diabetes.  Recheck her white blood cell count as well.  Also check fasting lipid panel.  Goal LDL cholesterol is less than 100.  Goal hemoglobin A1c is less than 7.  Strongly recommended the patient get the Covid vaccination.

## 2019-09-08 LAB — COMPLETE METABOLIC PANEL WITH GFR
AG Ratio: 1.5 (calc) (ref 1.0–2.5)
ALT: 18 U/L (ref 6–29)
AST: 24 U/L (ref 10–35)
Albumin: 4.2 g/dL (ref 3.6–5.1)
Alkaline phosphatase (APISO): 103 U/L (ref 37–153)
BUN/Creatinine Ratio: 19 (calc) (ref 6–22)
BUN: 23 mg/dL (ref 7–25)
CO2: 29 mmol/L (ref 20–32)
Calcium: 9.7 mg/dL (ref 8.6–10.4)
Chloride: 102 mmol/L (ref 98–110)
Creat: 1.24 mg/dL — ABNORMAL HIGH (ref 0.60–0.93)
GFR, Est African American: 51 mL/min/{1.73_m2} — ABNORMAL LOW (ref >=60)
GFR, Est Non African American: 44 mL/min/{1.73_m2} — ABNORMAL LOW (ref >=60)
Globulin: 2.8 g/dL (calc) (ref 1.9–3.7)
Glucose, Bld: 89 mg/dL (ref 65–99)
Potassium: 4.9 mmol/L (ref 3.5–5.3)
Sodium: 140 mmol/L (ref 135–146)
Total Bilirubin: 0.4 mg/dL (ref 0.2–1.2)
Total Protein: 7 g/dL (ref 6.1–8.1)

## 2019-09-08 LAB — LIPID PANEL
Cholesterol: 154 mg/dL (ref <200)
HDL: 51 mg/dL (ref >=50)
LDL Cholesterol (Calc): 82 mg/dL (calc)
Non-HDL Cholesterol (Calc): 103 mg/dL (calc) (ref <130)
Total CHOL/HDL Ratio: 3 (calc) (ref <5.0)
Triglycerides: 109 mg/dL (ref <150)

## 2019-09-08 LAB — HEMOGLOBIN A1C
Hgb A1c MFr Bld: 6 % of total Hgb — ABNORMAL HIGH (ref <5.7)
Mean Plasma Glucose: 126 (calc)
eAG (mmol/L): 7 (calc)

## 2019-09-08 LAB — MICROALBUMIN, URINE: Microalb, Ur: 1.1 mg/dL

## 2019-09-09 ENCOUNTER — Telehealth: Payer: Self-pay | Admitting: Family Medicine

## 2019-09-09 DIAGNOSIS — M5416 Radiculopathy, lumbar region: Secondary | ICD-10-CM | POA: Diagnosis not present

## 2019-09-09 DIAGNOSIS — M5136 Other intervertebral disc degeneration, lumbar region: Secondary | ICD-10-CM | POA: Diagnosis not present

## 2019-09-09 DIAGNOSIS — M47816 Spondylosis without myelopathy or radiculopathy, lumbar region: Secondary | ICD-10-CM | POA: Diagnosis not present

## 2019-09-09 NOTE — Progress Notes (Signed)
°  Chronic Care Management   Note  09/09/2019 Name: [name] MRN: [id number] DOB: 09/03/1945  [name] is a 74 y.o. year old female who is a primary care patient of Dennard Schaumann, Cammie Mcgee, MD. I reached out to [name] by phone today in response to a referral sent by [name] PCP, Susy Frizzle, MD.   [name] was given information about Chronic Care Management services today including:  1. CCM service includes personalized support from designated clinical staff supervised by her physician, including individualized plan of care and coordination with other care providers 2. 24/7 contact phone numbers for assistance for urgent and routine care needs. 3. Service will only be billed when office clinical staff spend 20 minutes or more in a month to coordinate care. 4. Only one practitioner may furnish and bill the service in a calendar month. 5. The patient may stop CCM services at any time (effective at the end of the month) by phone call to the office staff.   Patient agreed to services and verbal consent obtained.   Follow up plan:   Carley Perdue [employer] Scheduler

## 2019-09-15 ENCOUNTER — Other Ambulatory Visit: Payer: Self-pay

## 2019-09-15 MED ORDER — GABAPENTIN 300 MG PO CAPS
300.0000 mg | ORAL_CAPSULE | Freq: Three times a day (TID) | ORAL | 3 refills | Status: DC
Start: 2019-09-15 — End: 2019-12-14

## 2019-09-21 DIAGNOSIS — E119 Type 2 diabetes mellitus without complications: Secondary | ICD-10-CM | POA: Diagnosis not present

## 2019-09-21 LAB — HM DIABETES EYE EXAM

## 2019-09-28 ENCOUNTER — Other Ambulatory Visit: Payer: Self-pay | Admitting: Family Medicine

## 2019-10-07 ENCOUNTER — Other Ambulatory Visit: Payer: Self-pay | Admitting: Family Medicine

## 2019-10-14 DIAGNOSIS — M47816 Spondylosis without myelopathy or radiculopathy, lumbar region: Secondary | ICD-10-CM | POA: Diagnosis not present

## 2019-10-14 DIAGNOSIS — M5416 Radiculopathy, lumbar region: Secondary | ICD-10-CM | POA: Diagnosis not present

## 2019-10-14 DIAGNOSIS — M5136 Other intervertebral disc degeneration, lumbar region: Secondary | ICD-10-CM | POA: Diagnosis not present

## 2019-10-15 ENCOUNTER — Other Ambulatory Visit: Payer: Self-pay | Admitting: *Deleted

## 2019-10-15 DIAGNOSIS — E7800 Pure hypercholesterolemia, unspecified: Secondary | ICD-10-CM

## 2019-10-15 DIAGNOSIS — M797 Fibromyalgia: Secondary | ICD-10-CM

## 2019-10-15 DIAGNOSIS — E119 Type 2 diabetes mellitus without complications: Secondary | ICD-10-CM

## 2019-10-15 DIAGNOSIS — I10 Essential (primary) hypertension: Secondary | ICD-10-CM

## 2019-10-15 DIAGNOSIS — M2550 Pain in unspecified joint: Secondary | ICD-10-CM

## 2019-10-21 ENCOUNTER — Telehealth: Payer: Self-pay | Admitting: Family Medicine

## 2019-10-21 NOTE — Progress Notes (Signed)
  Chronic Care Management   Note  10/21/2019 Name: CLAUDIA GREENLEY MRN: [id number] DOB: November 02, 1945  KAYLYNN CHAMBLIN is a 74 y.o. year old female who is a primary care patient of Dennard Schaumann, Cammie Mcgee, MD. I reached out to [name] by phone today in response to a referral sent by [name] PCP, Susy Frizzle, MD.   Ms. Jablon was given information about Chronic Care Management services today including:  1. CCM service includes personalized support from designated clinical staff supervised by her physician, including individualized plan of care and coordination with other care providers 2. 24/7 contact phone numbers for assistance for urgent and routine care needs. 3. Service will only be billed when office clinical staff spend 20 minutes or more in a month to coordinate care. 4. Only one practitioner may furnish and bill the service in a calendar month. 5. The patient may stop CCM services at any time (effective at the end of the month) by phone call to the office staff.   Patient agreed to services and verbal consent obtained.   Follow up plan:   Carley Perdue [employer] Scheduler

## 2019-10-23 ENCOUNTER — Ambulatory Visit: Payer: Medicare HMO | Admitting: Pharmacist

## 2019-10-23 ENCOUNTER — Ambulatory Visit: Payer: Medicare HMO

## 2019-10-23 DIAGNOSIS — J439 Emphysema, unspecified: Secondary | ICD-10-CM

## 2019-10-23 DIAGNOSIS — I10 Essential (primary) hypertension: Secondary | ICD-10-CM

## 2019-10-23 DIAGNOSIS — E119 Type 2 diabetes mellitus without complications: Secondary | ICD-10-CM

## 2019-10-23 DIAGNOSIS — E7800 Pure hypercholesterolemia, unspecified: Secondary | ICD-10-CM

## 2019-10-23 NOTE — Chronic Care Management (AMB) (Addendum)
Chronic Care Management Pharmacy  Name: [name]  MRN: [id number] DOB: 1945/12/15  Chief Complaint/ HPI  [name],  74 y.o. , female presents for their Initial CCM visit with the clinical pharmacist via telephone.  PCP : Susy Frizzle, MD  Their chronic conditions include: HTN, COPD, GERD, Diabetes, CKD, Depression/Anxiety, hypercholesterolemia,.  Office Visits: 09/07/2019 Dennard Schaumann) -  Patient was unable to afford Lyrica, previously on 35m tid Switched to gabapentin 3044mtid Recommended rheumatoid consult for PMR  05/21/2019 ([place]) -  Patient has pain all over her body (arms, hands, hips, legs) Also added singular 1050maily for allergic rhintis  Consult Visit:  Medications: Outpatient Encounter Medications as of 10/23/2019  Medication Sig   atorvastatin (LIPITOR) 80 MG tablet TAKE 1 TABLET EVERY DAY   benzonatate (TESSALON) 100 MG capsule TAKE 1 CAPSULE (100 MG TOTAL) BY MOUTH 2 (TWO) TIMES DAILY AS NEEDED FOR COUGH.   Blood Glucose Monitoring Suppl (ACCU-CHEK AVIVA PLUS) w/Device KIT Use as directed to monitor FSBS 2x daily. Dx: E11.9   fluticasone (FLONASE) 50 MCG/ACT nasal spray USE 2 SPRAYS IN EACH NOSTRIL EVERY DAY   gabapentin (NEURONTIN) 300 MG capsule Take 1 capsule (300 mg total) by mouth 3 (three) times daily.   glucose blood (ACCU-CHEK AVIVA PLUS) test strip USE AS DIRECTED TO MONITOR FINGER STICK BLOOD SUGAR 2 TIMES DAILY   HYDROcodone-acetaminophen (NORCO/VICODIN) 5-325 MG tablet One tablet every six hours for pain.  Limit 7 days.   metoprolol succinate (TOPROL-XL) 25 MG 24 hr tablet Take 1 tablet (25 mg total) by mouth daily.   montelukast (SINGULAIR) 10 MG tablet Take 1 tablet (10 mg total) by mouth at bedtime.   pioglitazone (ACTOS) 15 MG tablet TAKE 1 TABLET EVERY DAY   Polyethyl Glycol-Propyl Glycol (SYSTANE OP) Apply 1 drop to eye 3 (three) times daily.   SYMBICORT 160-4.5 MCG/ACT inhaler INHALE 2 PUFFS INTO THE LUNGS TWICE DAILY.     traZODone (DESYREL) 50 MG tablet TAKE 3 TABLETS BY MOUTH AT BEDTIME AS NEEDED FOR SLEEP.   venlafaxine XR (EFFEXOR-XR) 75 MG 24 hr capsule TAKE 2 CAPSULES EVERY DAY   VENTOLIN HFA 108 (90 Base) MCG/ACT inhaler INHALE 2 PUFFS EVERY 4 TO 6 HOURS AS NEEDED FOR WHEEZING AND SHORTNESS OF BREATH.   No facility-administered encounter medications on file as of 10/23/2019.     Current Diagnosis/Assessment:  Goals Addressed             This Visit's Progress    Pharmacy Care Plan:       CARE PLAN ENTRY (see longitudinal plan of care for additional care plan information)  Current Barriers:  Chronic Disease Management support, education, and care coordination needs related to Hypertension, Hyperlipidemia, Diabetes, and COPD   Hypertension BP Readings from Last 3 Encounters:  09/07/19 130/68  05/21/19 128/68  03/03/19 130/66   Pharmacist Clinical Goal(s): Over the next 180 days, patient will work with PharmD and providers to maintain BP goal <130/80 Current regimen:  Metoprolol XL 72m72mterventions: Reviewed office BP readings Discussed medication adherence Patient self care activities - Over the next 180 days, patient will: Check BP periodically, document, and provide at future appointments Ensure daily salt intake < 2300 mg/day  Hyperlipidemia Lab Results  Component Value Date/Time   [facility]/16/2021 02:58 PM   Pharmacist Clinical Goal(s): Over the next 180 days, patient will work with PharmD and providers to maintain LDL goal < 80 Current regimen:  Atorvastatin 80mg27merventions: Reviewed most  recent lipid panel Patient self care activities - Over the next 180 days, patient will: Continue to focus on medication adherence by pill count  Diabetes Lab Results  Component Value Date/Time   HGBA1C 6.0 (H) 09/07/2019 02:58 PM   HGBA1C 6.9 (H) 08/14/2018 09:46 AM   HGBA1C 6.1 (H) 12/07/2015 11:51 AM   Pharmacist Clinical Goal(s): Over the next 180 days, patient  will work with PharmD and providers to maintain A1c goal <7% Current regimen:  Pioglitazone 43m daily Interventions: Reviewed home blood sugar monitoring Discussed signs/sx of hypoglycemia Patient self care activities - Over the next 180 days, patient will: Check blood sugar once daily, document, and provide at future appointments Contact provider with any episodes of hypoglycemia COPD Pharmacist Clinical Goal(s) Over the next 180 days, patient will work with PharmD and providers to optimize medication and minimize symptoms of COPD. Current regimen:  Symbicort 160-4.589m Ventolin HFA 9073mprn Interventions: Discussed cost barrier to proper inhaler use  Will apply for PAP program Patient self care activities - Over the next 180 days, patient will: Continue to focus on medication adherence Contact providers with any sudden change or worsening of symptoms   Initial goal documentation         COPD/Tobacco   Eosinophil count:   Lab Results  Component Value Date/Time   EOSPCT 2.0 05/21/2019 02:28 PM  %                               Eos (Absolute):  Lab Results  Component Value Date/Time   EOSABS 260 05/21/2019 02:28 PM    Tobacco Status:  Social History   Tobacco Use  Smoking Status Current Some Day Smoker   Packs/day: 0.50   Years: 52.00   Pack years: 26.00   Types: Cigarettes  Smokeless Tobacco Never Used    Patient has failed these meds in past: none noted Patient is currently controlled on the following medications:  Symbicort 160-4.5mc13mentolin HFA 90mc62mn Using maintenance inhaler regularly? No Frequency of rescue inhaler use:  multiple times per day  She is only using her Symbicort once daily due to cost concerns. Counseled on proper use, patient only income is SSI so she may qualify for PAP on Symbicort.  Reports multiple uses per day of rescue inhalers.  I think it is important for her to use Symbicort twice daily as maintenance.  Patient aware and  gathering documents for application.  Plan  Continue current medications, apply for PAP on Symbicort  Hypertension   BP goal is:  <130/80  Office blood pressures are  BP Readings from Last 3 Encounters:  09/07/19 130/68  05/21/19 128/68  03/03/19 130/66   Patient checks BP at home  never Patient home BP readings are ranging: no logs  Patient has failed these meds in the past: none noted Patient is currently controlled on the following medications:  Metoprolol Xl 25mg 70my  Denies headaches, dizziness.  Takes daily no concerns.  Office BP has been well controlled in the past.  Plan  Continue current medications     Hypercholesterolemia   LDL goal < 100  Lipid Panel     Component Value Date/Time   CHOL 154 09/07/2019 1458   TRIG 109 09/07/2019 1458   HDL 51 09/07/2019 1458   LDLCALC 82 09/07/2019 1458    Hepatic Function Latest Ref Rng & Units 09/07/2019 05/21/2019 12/11/2018  Total Protein 6.1 - 8.1 g/dL 7.0  7.1 7.4  Albumin 3.6 - 5.1 g/dL - - -  AST 10 - 35 U/L '24 25 24  ' ALT 6 - 29 U/L '18 19 20  ' Alk Phosphatase 33 - 130 U/L - - -  Total Bilirubin 0.2 - 1.2 mg/dL 0.4 0.3 0.3  Bilirubin, Direct <=0.2 mg/dL - - -     The 10-year ASCVD risk score Mikey Bussing DC Jr., et al., 2013) is: 36.7%   Values used to calculate the score:     Age: 65 years     Sex: Female     Is Non-Hispanic African American: No     Diabetic: Yes     Tobacco smoker: Yes     Systolic Blood Pressure: 683 mmHg     Is BP treated: Yes     HDL Cholesterol: 51 mg/dL     Total Cholesterol: 154 mg/dL   Patient has failed these meds in past: none noted Patient is currently controlled on the following medications:  Atorvastatin 72m Denies myalgias.  Takes daily as directed.  LDL is WNL.   Plan  Continue current medications Diabetes   A1c goal <7%  Recent Relevant Labs: Lab Results  Component Value Date/Time   HGBA1C 6.0 (H) 09/07/2019 02:58 PM   HGBA1C 6.9 (H) 08/14/2018 09:46 AM    HGBA1C 6.1 (H) 12/07/2015 11:51 AM   MICROALBUR 1.1 09/07/2019 02:58 PM   MICROALBUR 0.5 01/24/2017 12:28 PM    Last diabetic Eye exam: No results found for: HMDIABEYEEXA  Last diabetic Foot exam: No results found for: HMDIABFOOTEX   Checking BG: Daily  Recent FBG Readings: 100s   Patient has failed these meds in past: metformin (GI) Patient is currently controlled on the following medications: Pioglitazone 124mdaily  Fasting blood sugars are great.  She reports almost every time it is around 100.  Adherent to medication with no concerns.  Denies any hypoglycemia.    Plan  Continue current medications  Depression/Anxiety   Patient has failed these meds in past: none noted Patient is currently controlled on the following medications:  Venlafaxine XR 7524maily  Denies depression.  Sleep between 6-8 hours per night no complaints.  Plan  Continue current medications CKD   Kidney Function Lab Results  Component Value Date/Time   CREATININE 1.24 (H) 09/07/2019 02:58 PM   CREATININE 1.07 (H) 05/21/2019 02:28 PM   GFRNONAA 44 (L) 09/07/2019 02:58 PM   GFRAA 51 (L) 09/07/2019 02:58 PM   K 4.9 09/07/2019 02:58 PM   K 5.5 (H) 05/21/2019 02:28 PM    Evaluated medication profile for current kidney function.    Continue current medications. Vaccines   Reviewed and discussed patient's vaccination history.    Immunization History  Administered Date(s) Administered   Fluad Quad(high Dose 65+) 12/11/2018   Influenza, High Dose Seasonal PF 12/24/2016, 12/09/2017   Influenza,inj,Quad PF,6+ Mos 01/12/2015, 12/07/2015   Influenza-Unspecified 02/27/2013   Pneumococcal Conjugate-13 01/23/2004, 01/12/2015   Tdap 11/30/2008    Plan  Recommended patient receive flu vaccine in office.  Medication Management   Miscellaneous medications:  Trazodone 78m18mntelukast 10mg8m's: none Patient currently uses [employer] order and [employer] reports using pill  box method to organize medications and promote adherence. Patient denies missed doses of medication.   ChrisBeverly MilchrmD Clinical Pharmacist [place])[phone] collaborated with the care management provider regarding care management and care coordination activities outlined in this encounter and have reviewed this encounter including documentation  in the note and care plan. I am certifying that I agree with the content of this note and encounter as supervising physician.

## 2019-10-23 NOTE — Patient Instructions (Addendum)
Visit Information Thank you for meeting with me today!  I look forward to working with you to help you meet all of your healthcare goals and answer any questions you may have.  Feel free to contact me anytime!  Goals Addressed            This Visit's Progress   . Pharmacy Care Plan:       CARE PLAN ENTRY (see longitudinal plan of care for additional care plan information)  Current Barriers:  . Chronic Disease Management support, education, and care coordination needs related to Hypertension, Hyperlipidemia, Diabetes, and COPD   Hypertension BP Readings from Last 3 Encounters:  09/07/19 130/68  05/21/19 128/68  03/03/19 130/66   . Pharmacist Clinical Goal(s): o Over the next 180 days, patient will work with PharmD and providers to maintain BP goal <130/80 . Current regimen:  o Metoprolol XL 25mg  . Interventions: o Reviewed office BP readings o Discussed medication adherence . Patient self care activities - Over the next 180 days, patient will: o Check BP periodically, document, and provide at future appointments o Ensure daily salt intake < 2300 mg/day  Hyperlipidemia Lab Results  Component Value Date/Time   LDLCALC 82 09/07/2019 02:58 PM   . Pharmacist Clinical Goal(s): o Over the next 180 days, patient will work with PharmD and providers to maintain LDL goal < 80 . Current regimen:  o Atorvastatin 80mg  . Interventions: o Reviewed most recent lipid panel . Patient self care activities - Over the next 180 days, patient will: o Continue to focus on medication adherence by pill count  Diabetes Lab Results  Component Value Date/Time   HGBA1C 6.0 (H) 09/07/2019 02:58 PM   HGBA1C 6.9 (H) 08/14/2018 09:46 AM   HGBA1C 6.1 (H) 12/07/2015 11:51 AM   . Pharmacist Clinical Goal(s): o Over the next 180 days, patient will work with PharmD and providers to maintain A1c goal <7% . Current regimen:  o Pioglitazone 15mg  daily . Interventions: o Reviewed home blood sugar  monitoring o Discussed signs/sx of hypoglycemia . Patient self care activities - Over the next 180 days, patient will: o Check blood sugar once daily, document, and provide at future appointments o Contact provider with any episodes of hypoglycemia COPD . Pharmacist Clinical Goal(s) o Over the next 180 days, patient will work with PharmD and providers to optimize medication and minimize symptoms of COPD. . Current regimen:   Symbicort 160-4.46mcg  Ventolin HFA 76mcg prn . Interventions: o Discussed cost barrier to proper inhaler use  o Will apply for PAP program . Patient self care activities - Over the next 180 days, patient will: o Continue to focus on medication adherence o Contact providers with any sudden change or worsening of symptoms   Initial goal documentation        Ms. [name] was given information about Chronic Care Management services today including:  1. CCM service includes personalized support from designated clinical staff supervised by her physician, including individualized plan of care and coordination with other care providers 2. 24/7 contact phone numbers for assistance for urgent and routine care needs. 3. Standard insurance, coinsurance, copays and deductibles apply for chronic care management only during months in which we provide at least 20 minutes of these services. Most insurances cover these services at 100%, however patients may be responsible for any copay, coinsurance and/or deductible if applicable. This service may help you avoid the need for more expensive face-to-face services. 4. Only one practitioner may furnish and bill  the service in a calendar month. 5. The patient may stop CCM services at any time (effective at the end of the month) by phone call to the office staff.  Patient agreed to services and verbal consent obtained.   The patient verbalized understanding of instructions provided today and agreed to receive a mailed copy of patient  instruction and/or educational materials. Telephone follow up appointment with pharmacy team member scheduled for: 93 months Keitha Kolk, PharmD Clinical Pharmacist [facility] [phone]   Diabetes Mellitus and Nutrition, Adult When you have diabetes (diabetes mellitus), it is very important to have healthy eating habits because your blood sugar (glucose) levels are greatly affected by what you eat and drink. Eating healthy foods in the appropriate amounts, at about the same times every day, can help you:  Control your blood glucose.  Lower your risk of heart disease.  Improve your blood pressure.  Reach or maintain a healthy weight. Every person with diabetes is different, and each person has different needs for a meal plan. Your health care provider may recommend that you work with a diet and nutrition specialist (dietitian) to make a meal plan that is best for you. Your meal plan may vary depending on factors such as:  The calories you need.  The medicines you take.  Your weight.  Your blood glucose, blood pressure, and cholesterol levels.  Your activity level.  Other health conditions you have, such as heart or kidney disease. How do carbohydrates affect me? Carbohydrates, also called carbs, affect your blood glucose level more than any other type of food. Eating carbs naturally raises the amount of glucose in your blood. Carb counting is a method for keeping track of how many carbs you eat. Counting carbs is important to keep your blood glucose at a healthy level, especially if you use insulin or take certain oral diabetes medicines. It is important to know how many carbs you can safely have in each meal. This is different for every person. Your dietitian can help you calculate how many carbs you should have at each meal and for each snack. Foods that contain carbs include:  Bread, cereal, rice, pasta, and crackers.  Potatoes and corn.  Peas,  beans, and lentils.  Milk and yogurt.  Fruit and juice.  Desserts, such as cakes, cookies, ice cream, and candy. How does alcohol affect me? Alcohol can cause a sudden decrease in blood glucose (hypoglycemia), especially if you use insulin or take certain oral diabetes medicines. Hypoglycemia can be a life-threatening condition. Symptoms of hypoglycemia (sleepiness, dizziness, and confusion) are similar to symptoms of having too much alcohol. If your health care provider says that alcohol is safe for you, follow these guidelines:  Limit alcohol intake to no more than 1 drink per day for nonpregnant women and 2 drinks per day for men. One drink equals 12 oz of beer, 5 oz of wine, or 1 oz of hard liquor.  Do not drink on an empty stomach.  Keep yourself hydrated with water, diet soda, or unsweetened iced tea.  Keep in mind that regular soda, juice, and other mixers may contain a lot of sugar and must be counted as carbs. What are tips for following this plan?  Reading food labels  Start by checking the serving size on the "Nutrition Facts" label of packaged foods and drinks. The amount of calories, carbs, fats, and other nutrients listed on the label is based on one serving of the item. Many items  contain more than one serving per package.  Check the total grams (g) of carbs in one serving. You can calculate the number of servings of carbs in one serving by dividing the total carbs by 15. For example, if a food has 30 g of total carbs, it would be equal to 2 servings of carbs.  Check the number of grams (g) of saturated and trans fats in one serving. Choose foods that have low or no amount of these fats.  Check the number of milligrams (mg) of salt (sodium) in one serving. Most people should limit total sodium intake to less than 2,300 mg per day.  Always check the nutrition information of foods labeled as "low-fat" or "nonfat". These foods may be higher in added sugar or refined carbs  and should be avoided.  Talk to your dietitian to identify your daily goals for nutrients listed on the label. Shopping  Avoid buying canned, premade, or processed foods. These foods tend to be high in fat, sodium, and added sugar.  Shop around the outside edge of the grocery store. This includes fresh fruits and vegetables, bulk grains, fresh meats, and fresh dairy. Cooking  Use low-heat cooking methods, such as baking, instead of high-heat cooking methods like deep frying.  Cook using healthy oils, such as olive, canola, or sunflower oil.  Avoid cooking with butter, cream, or high-fat meats. Meal planning  Eat meals and snacks regularly, preferably at the same times every day. Avoid going long periods of time without eating.  Eat foods high in fiber, such as fresh fruits, vegetables, beans, and whole grains. Talk to your dietitian about how many servings of carbs you can eat at each meal.  Eat 4-6 ounces (oz) of lean protein each day, such as lean meat, chicken, fish, eggs, or tofu. One oz of lean protein is equal to: ? 1 oz of meat, chicken, or fish. ? 1 egg. ?  cup of tofu.  Eat some foods each day that contain healthy fats, such as avocado, nuts, seeds, and fish. Lifestyle  Check your blood glucose regularly.  Exercise regularly as told by your health care provider. This may include: ? 150 minutes of moderate-intensity or vigorous-intensity exercise each week. This could be brisk walking, biking, or water aerobics. ? Stretching and doing strength exercises, such as yoga or weightlifting, at least 2 times a week.  Take medicines as told by your health care provider.  Do not use any products that contain nicotine or tobacco, such as cigarettes and e-cigarettes. If you need help quitting, ask your health care provider.  Work with a Social worker or diabetes educator to identify strategies to manage stress and any emotional and social challenges. Questions to ask a health care  provider  Do I need to meet with a diabetes educator?  Do I need to meet with a dietitian?  What number can I call if I have questions?  When are the best times to check my blood glucose? Where to find more information:  American Diabetes Association: diabetes.org  Academy of Nutrition and Dietetics: www.eatright.[employer] of Diabetes and Digestive and Kidney Diseases (NIH): [employer] Summary  A healthy meal plan will help you control your blood glucose and maintain a healthy lifestyle.  Working with a diet and nutrition specialist (dietitian) can help you make a meal plan that is best for you.  Keep in mind that carbohydrates (carbs) and alcohol have immediate effects on your blood glucose levels. It is important to  count carbs and to use alcohol carefully. This information is not intended to replace advice given to you by your health care provider. Make sure you discuss any questions you have with your health care provider. Document Revised: 12/21/2016 Document Reviewed: 02/13/2016 Elsevier Patient Education  2020 [employer].

## 2019-10-29 ENCOUNTER — Telehealth: Payer: Self-pay

## 2019-10-29 NOTE — Telephone Encounter (Signed)
[facility] Lelon Frohlich) called and needs Rheumatoid Factor and CK from labs this is missing from the labs that was sent over. Patient name is [name] DOB 10/27/1945  Ph [phone] Fax [phone]

## 2019-11-05 NOTE — Telephone Encounter (Signed)
Please Advise

## 2019-11-06 NOTE — Telephone Encounter (Signed)
Please send the requested labs. 11/2018

## 2019-11-16 NOTE — Telephone Encounter (Signed)
I have faxed the information requested to [facility] release # [id number]

## 2019-11-23 ENCOUNTER — Other Ambulatory Visit: Payer: Self-pay | Admitting: Family Medicine

## 2019-11-23 DIAGNOSIS — M797 Fibromyalgia: Secondary | ICD-10-CM

## 2019-11-23 DIAGNOSIS — J301 Allergic rhinitis due to pollen: Secondary | ICD-10-CM

## 2019-12-09 ENCOUNTER — Other Ambulatory Visit: Payer: Self-pay | Admitting: Family Medicine

## 2019-12-14 ENCOUNTER — Other Ambulatory Visit: Payer: Self-pay

## 2019-12-14 DIAGNOSIS — J209 Acute bronchitis, unspecified: Secondary | ICD-10-CM

## 2019-12-14 MED ORDER — VENLAFAXINE HCL ER 75 MG PO CP24
150.0000 mg | ORAL_CAPSULE | Freq: Every day | ORAL | 1 refills | Status: DC
Start: 2019-12-14 — End: 2020-06-24

## 2019-12-14 MED ORDER — ALBUTEROL SULFATE HFA 108 (90 BASE) MCG/ACT IN AERS: INHALATION_SPRAY | RESPIRATORY_TRACT | 3 refills | Status: DC

## 2019-12-14 MED ORDER — GABAPENTIN 300 MG PO CAPS
300.0000 mg | ORAL_CAPSULE | Freq: Three times a day (TID) | ORAL | 3 refills | Status: DC
Start: 2019-12-14 — End: 2020-04-18

## 2019-12-18 ENCOUNTER — Other Ambulatory Visit: Payer: Self-pay | Admitting: Family Medicine

## 2019-12-18 DIAGNOSIS — M797 Fibromyalgia: Secondary | ICD-10-CM

## 2019-12-18 DIAGNOSIS — J301 Allergic rhinitis due to pollen: Secondary | ICD-10-CM

## 2020-01-27 ENCOUNTER — Other Ambulatory Visit: Payer: Self-pay | Admitting: *Deleted

## 2020-01-27 MED ORDER — PIOGLITAZONE HCL 15 MG PO TABS
15.0000 mg | ORAL_TABLET | Freq: Every day | ORAL | 1 refills | Status: DC
Start: 2020-01-27 — End: 2020-06-14

## 2020-02-04 ENCOUNTER — Telehealth: Payer: Self-pay | Admitting: Pharmacist

## 2020-02-04 NOTE — Progress Notes (Signed)
Chronic Care Management Pharmacy Assistant   Name: [name]  MRN: [id number] DOB: 1945-04-22  Reason for Encounter: Disease State for COPD  Patient Questions:  1.  Have you seen any other providers since your last visit? No.   2.  Any changes in your medicines or health? No.    PCP : Susy Frizzle, MD  Their chronic conditions include: HTN, COPD, GERD, Diabetes, CKD, Depression/Anxiety, hypercholesterolemia,.  Office Visits: None since 10/23/19  Consults: None since 10/23/19  Allergies:   Allergies  Allergen Reactions  . Codeine Itching  . Flonase [Fluticasone Propionate] Cough    Severe coughing  . Metformin And Related Diarrhea    Caused severe diarrhea and severe GI symptoms---even on low dose 590m BID    Medications: Outpatient Encounter Medications as of 02/04/2020  Medication Sig  . albuterol (VENTOLIN HFA) 108 (90 Base) MCG/ACT inhaler INHALE 2 PUFFS EVERY 4 TO 6 HOURS AS NEEDED FOR WHEEZING AND SHORTNESS OF BREATH.  .Marland Kitchenatorvastatin (LIPITOR) 80 MG tablet TAKE 1 TABLET EVERY DAY  . benzonatate (TESSALON) 100 MG capsule TAKE 1 CAPSULE (100 MG TOTAL) BY MOUTH 2 (TWO) TIMES DAILY AS NEEDED FOR COUGH.  . Blood Glucose Monitoring Suppl (ACCU-CHEK AVIVA PLUS) w/Device KIT Use as directed to monitor FSBS 2x daily. Dx: E11.9  . fluticasone (FLONASE) 50 MCG/ACT nasal spray USE 2 SPRAYS IN EACH NOSTRIL EVERY DAY  . gabapentin (NEURONTIN) 300 MG capsule Take 1 capsule (300 mg total) by mouth 3 (three) times daily.  .Marland Kitchenglucose blood (ACCU-CHEK AVIVA PLUS) test strip USE AS DIRECTED TO MONITOR FINGER STICK BLOOD SUGAR 2 TIMES DAILY  . HYDROcodone-acetaminophen (NORCO/VICODIN) 5-325 MG tablet One tablet every six hours for pain.  Limit 7 days.  . metoprolol succinate (TOPROL-XL) 25 MG 24 hr tablet TAKE 1 TABLET EVERY DAY  . montelukast (SINGULAIR) 10 MG tablet Take 1 tablet (10 mg total) by mouth at bedtime.  . pioglitazone (ACTOS) 15 MG tablet Take 1 tablet (15 mg  total) by mouth daily.  .Vladimir FasterGlycol-Propyl Glycol (SYSTANE OP) Apply 1 drop to eye 3 (three) times daily.  . SYMBICORT 160-4.5 MCG/ACT inhaler INHALE 2 PUFFS INTO THE LUNGS TWICE DAILY.  . traZODone (DESYREL) 50 MG tablet TAKE 3 TABLETS BY MOUTH AT BEDTIME AS NEEDED FOR SLEEP.  .Marland Kitchenvenlafaxine XR (EFFEXOR-XR) 75 MG 24 hr capsule Take 2 capsules (150 mg total) by mouth daily.   No facility-administered encounter medications on file as of 02/04/2020.    Current Diagnosis: Patient Active Problem List   Diagnosis Date Noted  . Anxiety and depression 05/02/2017  . Helicobacter pylori gastritis 01/18/2017  . Abdominal pain, epigastric 09/21/2016  . RUQ pain 09/21/2016  . Hx of adenomatous colonic polyps 09/21/2016  . GERD (gastroesophageal reflux disease) 09/21/2016  . Chronic kidney disease (CKD), stage III (moderate) ([place] 12/07/2015  . Osteochondral lesion of talar dome 10/21/2015  . Panic disorder 03/28/2015  . Tobacco use disorder 01/12/2015  . Foot pain, bilateral 01/12/2015  . COPD (chronic obstructive pulmonary disease) ([place]   . Depression   . Diabetes mellitus without complication ([place]   . Hypercholesteremia   . Hypertension   . Anxiety 02/27/2013  . PERSISTENT DISORDER INITIATING/MAINTAINING SLEEP 11/26/2008  . Obstructive sleep apnea 11/26/2008  . HYPOXEMIA 11/26/2008  . HIP PAIN 03/10/2007  . DMiddle AmanaDEGENERATION 03/10/2007  . BURSITIS, HIP 03/10/2007    Goals Addressed   None    . Current COPD regimen:   Symbicort 160-4.5 mcg  Ventolin HFA 90 mcg prn  Adherence Review: . Does the patient have >5 day gap between last estimated fill date for maintenance inhaler medications? No, CPP Please Check.   Third unsuccessful telephone outreach was attempted today. The patient was referred to the pharmacist for assistance with care management and care coordination.    Follow-Up:  Pharmacist Review   [name], [facility] Pharmacist  Assistant [phone]

## 2020-03-24 ENCOUNTER — Ambulatory Visit: Payer: Medicare HMO | Admitting: Podiatry

## 2020-03-28 ENCOUNTER — Ambulatory Visit: Payer: Medicare HMO | Admitting: Podiatry

## 2020-03-29 ENCOUNTER — Ambulatory Visit: Payer: Medicare HMO | Admitting: Orthopaedic Surgery

## 2020-04-13 ENCOUNTER — Other Ambulatory Visit: Payer: Self-pay | Admitting: Family Medicine

## 2020-04-18 ENCOUNTER — Other Ambulatory Visit: Payer: Self-pay

## 2020-04-18 ENCOUNTER — Ambulatory Visit: Payer: Medicare HMO | Admitting: Family Medicine

## 2020-04-18 ENCOUNTER — Ambulatory Visit (INDEPENDENT_AMBULATORY_CARE_PROVIDER_SITE_OTHER): Payer: Medicare HMO | Admitting: Family Medicine

## 2020-04-18 ENCOUNTER — Encounter: Payer: Self-pay | Admitting: Family Medicine

## 2020-04-18 VITALS — BP 138/88 | HR 90 | Temp 98.5°F | Resp 16 | Ht 65.0 in | Wt 213.0 lb

## 2020-04-18 DIAGNOSIS — R053 Chronic cough: Secondary | ICD-10-CM | POA: Diagnosis not present

## 2020-04-18 DIAGNOSIS — I10 Essential (primary) hypertension: Secondary | ICD-10-CM | POA: Diagnosis not present

## 2020-04-18 DIAGNOSIS — E119 Type 2 diabetes mellitus without complications: Secondary | ICD-10-CM | POA: Diagnosis not present

## 2020-04-18 DIAGNOSIS — E7800 Pure hypercholesterolemia, unspecified: Secondary | ICD-10-CM | POA: Diagnosis not present

## 2020-04-18 MED ORDER — LEVOCETIRIZINE DIHYDROCHLORIDE 5 MG PO TABS: 5.0000 mg | ORAL_TABLET | Freq: Every evening | ORAL | 3 refills | Status: DC

## 2020-04-18 MED ORDER — GABAPENTIN 300 MG PO CAPS: 300.0000 mg | ORAL_CAPSULE | Freq: Three times a day (TID) | ORAL | 3 refills | Status: DC

## 2020-04-18 NOTE — Progress Notes (Signed)
Subjective:    Patient ID: [name], female    DOB: 1945/02/04, 75 y.o.   MRN: [id number]  HPI  Patient is here today reporting a chronic cough.  She states the cough has been going on every day for months.  Is been going on for more than 2 to 3 months.  The cough is occasionally productive of yellow sputum.  Is worse first thing in the morning.  She denies any pleurisy.  She denies any hemoptysis.  She denies any chest pain.  She denies any fevers or chills.  She denies any sinus pain or sinus headaches.  She does report constant chronic rhinorrhea however she cannot tolerate the Flonase because it "triggers the cough".  She does report postnasal drip hanging in her throat and a constant tickle in her throat.  She also has acid reflux but she states that this is mild.  She has a history of COPD and is on Symbicort however she is only using it as needed for when she is really congested.  She also continues to smoke.  She denies any weight loss, night sweats, fevers. Past Medical History:  Diagnosis Date  . Allergy   . Anxiety   . Arthritis   . Asthma   . Bursitis   . Cataract   . COPD (chronic obstructive pulmonary disease) ([place])   . Depression   . Hiatal hernia   . Hypercholesteremia   . Hypertension   . Type 2 diabetes mellitus ([place])   . Ulcer    Past Surgical History:  Procedure Laterality Date  . ABDOMINAL HYSTERECTOMY    . ANKLE ARTHROSCOPY WITH DRILLING/MICROFRACTURE Left 10/12/2015   Procedure: LEFT ANKLE ARTHROSCOPY, REMOVAL OF OSTEOCHONDRAL LESION WITH MICROFRACTURE;  Surgeon: Trula Slade, DPM;  Location: [place];  Service: Podiatry;  Laterality: Left;  . BIOPSY  10/24/2016   Procedure: BIOPSY;  Surgeon: Daneil Dolin, MD;  [facility];  Service: Endoscopy;;  gastric esophageal  . CATARACT EXTRACTION W/PHACO  10/16/2010   Procedure: CATARACT EXTRACTION PHACO AND INTRAOCULAR LENS PLACEMENT ([place]);  Surgeon: Tonny Branch;  Location: [facility];  Service: Ophthalmology;  Laterality: Left;  CDE: 6.99  . CATARACT EXTRACTION W/PHACO  12/18/2010   Procedure: CATARACT EXTRACTION PHACO AND INTRAOCULAR LENS PLACEMENT ([facility]);  Surgeon: Tonny Branch;  Location: [facility];  Service: Ophthalmology;  Laterality: Right;  CDE 12.50  . COLONOSCOPY     about 5 years ago/[facility]  . COLONOSCOPY WITH PROPOFOL N/A 10/24/2016   Procedure: COLONOSCOPY WITH PROPOFOL;  Surgeon: Daneil Dolin, MD;  [facility];  Service: Endoscopy;  Laterality: N/A;  215  . ESOPHAGOGASTRODUODENOSCOPY  2003   Gastritis, path unavailable.  . ESOPHAGOGASTRODUODENOSCOPY (EGD) WITH PROPOFOL N/A 10/24/2016   Procedure: ESOPHAGOGASTRODUODENOSCOPY (EGD) WITH PROPOFOL;  Surgeon: Daneil Dolin, MD;  [facility];  Service: Endoscopy;  Laterality: N/A;  . EYE SURGERY    . fracture lower back    . left elbow tendon repair    . NEUROPLASTY / TRANSPOSITION MEDIAN NERVE AT CARPAL TUNNEL BILATERAL    . plantar fasc bilateral    . right rotator cuff    . rt foot reconstruction    . trigger thumb     Left   Current Outpatient Medications on File Prior to Visit  Medication Sig Dispense Refill  . albuterol (VENTOLIN HFA) 108 (90 Base) MCG/ACT inhaler INHALE 2 PUFFS EVERY 4 TO 6 HOURS AS NEEDED FOR WHEEZING AND  SHORTNESS OF BREATH. 18 g 3  . atorvastatin (LIPITOR) 80 MG tablet TAKE 1 TABLET EVERY DAY 90 tablet 1  . Blood Glucose Monitoring Suppl (ACCU-CHEK AVIVA PLUS) w/Device KIT Use as directed to monitor FSBS 2x daily. Dx: E11.9 1 kit 1  . fluticasone (FLONASE) 50 MCG/ACT nasal spray USE 2 SPRAYS IN EACH NOSTRIL EVERY DAY 48 g 3  . glucose blood (ACCU-CHEK AVIVA PLUS) test strip USE AS DIRECTED TO MONITOR FINGER STICK BLOOD SUGAR 2 TIMES DAILY 200 strip 3  . HYDROcodone-acetaminophen (NORCO/VICODIN) 5-325 MG tablet One tablet every six hours for pain.  Limit 7 days. 28 tablet 0  . metoprolol succinate (TOPROL-XL) 25 MG 24 hr tablet TAKE 1 TABLET EVERY DAY 90 tablet 3   . montelukast (SINGULAIR) 10 MG tablet Take 1 tablet (10 mg total) by mouth at bedtime. 90 tablet 3  . pioglitazone (ACTOS) 15 MG tablet Take 1 tablet (15 mg total) by mouth daily. 90 tablet 1  . Polyethyl Glycol-Propyl Glycol (SYSTANE OP) Apply 1 drop to eye 3 (three) times daily.    . SYMBICORT 160-4.5 MCG/ACT inhaler INHALE 2 PUFFS INTO THE LUNGS TWICE DAILY. 10.2 g 5  . traZODone (DESYREL) 50 MG tablet TAKE 3 TABLETS BY MOUTH AT BEDTIME AS NEEDED FOR SLEEP. 270 tablet 2  . venlafaxine XR (EFFEXOR-XR) 75 MG 24 hr capsule Take 2 capsules (150 mg total) by mouth daily. 180 capsule 1   No current facility-administered medications on file prior to visit.   Allergies  Allergen Reactions  . Codeine Itching  . Flonase [Fluticasone Propionate] Cough    Severe coughing  . Metformin And Related Diarrhea    Caused severe diarrhea and severe GI symptoms---even on low dose 577m BID   Social History   Socioeconomic History  . Marital status: Single    Spouse name: Not on file  . Number of children: Not on file  . Years of education: Not on file  . Highest education level: Not on file  Occupational History  . Not on file  Tobacco Use  . Smoking status: Current Some Day Smoker    Packs/day: 0.50    Years: 52.00    Pack years: 26.00    Types: Cigarettes  . Smokeless tobacco: Never Used  Vaping Use  . Vaping Use: Never used  Substance and Sexual Activity  . Alcohol use: Yes    Comment: rare  . Drug use: Yes    Frequency: 3.0 times per week    Types: Marijuana    Comment: not in a few months   . Sexual activity: Not Currently    Birth control/protection: Surgical  Other Topics Concern  . Not on file  Social History Narrative  . Not on file   Social Determinants of Health   Financial Resource Strain: Not on file  Food Insecurity: Not on file  Transportation Needs: Not on file  Physical Activity: Not on file  Stress: Not on file  Social Connections: Not on file  Intimate  Partner Violence: Not on file      Review of Systems  All other systems reviewed and are negative.      Objective:   Physical Exam Vitals reviewed.  Constitutional:      General: She is not in acute distress.    Appearance: Normal appearance. She is obese. She is not ill-appearing or toxic-appearing.  HENT:     Right Ear: Tympanic membrane and ear canal normal.     Left Ear: Tympanic  membrane and ear canal normal.     Nose: Nose normal.  Cardiovascular:     Rate and Rhythm: Normal rate and regular rhythm.     Heart sounds: Murmur heard.    Pulmonary:     Effort: Pulmonary effort is normal. No respiratory distress.     Breath sounds: No stridor. Wheezing present. No rhonchi or rales.  Musculoskeletal:     Right lower leg: No edema.     Left lower leg: No edema.  Lymphadenopathy:     Cervical: No cervical adenopathy.  Neurological:     Mental Status: She is alert.           Assessment & Plan:  Chronic cough - Plan: DG Chest 2 View, CBC with Differential/Platelet, COMPLETE METABOLIC PANEL WITH GFR, Hemoglobin A1c, Lipid panel  Diabetes mellitus without complication (HCC) - Plan: CBC with Differential/Platelet, COMPLETE METABOLIC PANEL WITH GFR, Hemoglobin A1c, Lipid panel  Hypercholesteremia - Plan: CBC with Differential/Platelet, COMPLETE METABOLIC PANEL WITH GFR, Hemoglobin A1c, Lipid panel  Primary hypertension - Plan: CBC with Differential/Platelet, COMPLETE METABOLIC PANEL WITH GFR, Hemoglobin A1c, Lipid panel  I believe the patient chronic cough is due to a combination of COPD that is poorly controlled as well as her constant smoking as well as postnasal drip due to allergies and sinusitis and perhaps even a contribution of acid reflux.  Begin by obtaining a chest x-ray given the duration of the cough.  Begin Symbicort 160/4.5, 2 puffs twice daily every day.  Add Xyzal 5 mg daily for postnasal drip and allergies.  Recheck in 2 weeks if no better or sooner if  worse.  Obtain fasting lab work regarding her diabetes.

## 2020-04-19 LAB — COMPLETE METABOLIC PANEL WITH GFR
AG Ratio: 1.4 (calc) (ref 1.0–2.5)
ALT: 17 U/L (ref 6–29)
AST: 24 U/L (ref 10–35)
Albumin: 4.3 g/dL (ref 3.6–5.1)
Alkaline phosphatase (APISO): 129 U/L (ref 37–153)
BUN/Creatinine Ratio: 18 (calc) (ref 6–22)
BUN: 17 mg/dL (ref 7–25)
CO2: 28 mmol/L (ref 20–32)
Calcium: 9.3 mg/dL (ref 8.6–10.4)
Chloride: 101 mmol/L (ref 98–110)
Creat: 0.95 mg/dL — ABNORMAL HIGH (ref 0.60–0.93)
GFR, Est African American: 69 mL/min/{1.73_m2} (ref >=60)
GFR, Est Non African American: 60 mL/min/{1.73_m2} (ref >=60)
Globulin: 3 g/dL (calc) (ref 1.9–3.7)
Glucose, Bld: 98 mg/dL (ref 65–99)
Potassium: 4.7 mmol/L (ref 3.5–5.3)
Sodium: 140 mmol/L (ref 135–146)
Total Bilirubin: 0.4 mg/dL (ref 0.2–1.2)
Total Protein: 7.3 g/dL (ref 6.1–8.1)

## 2020-04-19 LAB — CBC WITH DIFFERENTIAL/PLATELET
Absolute Monocytes: 699 cells/uL (ref 200–950)
Basophils Absolute: 76 cells/uL (ref 0–200)
Basophils Relative: 0.6 %
Eosinophils Absolute: 203 cells/uL (ref 15–500)
Eosinophils Relative: 1.6 %
HCT: 37.7 % (ref 35.0–45.0)
Hemoglobin: 12.3 g/dL (ref 11.7–15.5)
Lymphs Abs: 3505 cells/uL (ref 850–3900)
MCH: 28.6 pg (ref 27.0–33.0)
MCHC: 32.6 g/dL (ref 32.0–36.0)
MCV: 87.7 fL (ref 80.0–100.0)
MPV: 11 fL (ref 7.5–12.5)
Monocytes Relative: 5.5 %
Neutro Abs: 8217 cells/uL — ABNORMAL HIGH (ref 1500–7800)
Neutrophils Relative %: 64.7 %
Platelets: 326 10*3/uL (ref 140–400)
RBC: 4.3 10*6/uL (ref 3.80–5.10)
RDW: 13.4 % (ref 11.0–15.0)
Total Lymphocyte: 27.6 %
WBC: 12.7 10*3/uL — ABNORMAL HIGH (ref 3.8–10.8)

## 2020-04-19 LAB — LIPID PANEL
Cholesterol: 157 mg/dL (ref <200)
HDL: 53 mg/dL (ref >=50)
LDL Cholesterol (Calc): 83 mg/dL (calc)
Non-HDL Cholesterol (Calc): 104 mg/dL (calc) (ref <130)
Total CHOL/HDL Ratio: 3 (calc) (ref <5.0)
Triglycerides: 117 mg/dL (ref <150)

## 2020-04-19 LAB — HEMOGLOBIN A1C
Hgb A1c MFr Bld: 6 % of total Hgb — ABNORMAL HIGH (ref <5.7)
Mean Plasma Glucose: 126 mg/dL
eAG (mmol/L): 7 mmol/L

## 2020-04-20 ENCOUNTER — Encounter: Payer: Self-pay | Admitting: *Deleted

## 2020-04-22 NOTE — Progress Notes (Signed)
Chronic Care Management Pharmacy Note  04/25/2020 Name:  [name] MRN:  [id number] DOB:  01-Dec-1945  Subjective: [name] is an 75 y.o. year old female who is a primary patient of Pickard, Angela Mcgee, MD.  The CCM team was consulted for assistance with disease management and care coordination needs.    Engaged with patient by telephone for follow up visit in response to provider referral for pharmacy case management and/or care coordination services.   Consent to Services:  The patient was given the following information about Chronic Care Management services today, agreed to services, and gave verbal consent: 1. CCM service includes personalized support from designated clinical staff supervised by the primary care provider, including individualized plan of care and coordination with other care providers 2. 24/7 contact phone numbers for assistance for urgent and routine care needs. 3. Service will only be billed when office clinical staff spend 20 minutes or more in a month to coordinate care. 4. Only one practitioner may furnish and bill the service in a calendar month. 5.The patient may stop CCM services at any time (effective at the end of the month) by phone call to the office staff. 6. The patient will be responsible for cost sharing (co-pay) of up to 20% of the service fee (after annual deductible is met). Patient agreed to services and consent obtained.  Patient Care Team: Susy Frizzle, MD as PCP - General (Family Medicine) Gala Romney Cristopher Estimable, MD as Consulting Physician (Gastroenterology) Edythe Clarity, [facility] as Pharmacist (Pharmacist)  Recent office visits: 04/18/20 Dennard Schaumann) - Symbicort emphasized to use twice daily every day, Xyzal added for post nasal drip and allergies.  Recent consult visits: None since last CCM visit on 10/23/19  Hospital visits: None in previous 6 months  Objective:  Lab Results  Component Value Date   CREATININE 0.95 (H) 04/18/2020    BUN 17 04/18/2020   GFRNONAA 60 04/18/2020   GFRAA 69 04/18/2020   NA 140 04/18/2020   K 4.7 04/18/2020   CALCIUM 9.3 04/18/2020   CO2 28 04/18/2020   GLUCOSE 98 04/18/2020    Lab Results  Component Value Date/Time   HGBA1C 6.0 (H) 04/18/2020 02:57 PM   HGBA1C 6.0 (H) 09/07/2019 02:58 PM   HGBA1C 6.1 (H) 12/07/2015 11:51 AM   MICROALBUR 1.1 09/07/2019 02:58 PM   MICROALBUR 0.5 01/24/2017 12:28 PM    Last diabetic Eye exam: No results found for: HMDIABEYEEXA  Last diabetic Foot exam: No results found for: HMDIABFOOTEX   Lab Results  Component Value Date   CHOL 157 04/18/2020   HDL 53 04/18/2020   LDLCALC 83 04/18/2020   TRIG 117 04/18/2020   CHOLHDL 3.0 04/18/2020    Hepatic Function Latest Ref Rng & Units 04/18/2020 09/07/2019 05/21/2019  Total Protein 6.1 - 8.1 g/dL 7.3 7.0 7.1  Albumin 3.6 - 5.1 g/dL - - -  AST 10 - 35 U/L '24 24 25  ' ALT 6 - 29 U/L '17 18 19  ' Alk Phosphatase 33 - 130 U/L - - -  Total Bilirubin 0.2 - 1.2 mg/dL 0.4 0.4 0.3  Bilirubin, Direct <=0.2 mg/dL - - -    Lab Results  Component Value Date/Time   TSH 1.76 02/28/2015 09:56 AM    CBC Latest Ref Rng & Units 04/18/2020 05/21/2019 12/11/2018  WBC 3.8 - 10.8 Thousand/uL 12.7(H) 13.0(H) 14.1(H)  Hemoglobin 11.7 - 15.5 g/dL 12.3 13.3 11.8  Hematocrit 35.0 - 45.0 % 37.7 40.5 36.2  Platelets 140 -  400 Thousand/uL 326 388 350    No results found for: VD25OH  Clinical ASCVD: No  The 10-year ASCVD risk score Mikey Bussing DC Jr., et al., 2013) is: 43.4%   Values used to calculate the score:     Age: 65 years     Sex: Female     Is Non-Hispanic African American: No     Diabetic: Yes     Tobacco smoker: Yes     Systolic Blood Pressure: 832 mmHg     Is BP treated: Yes     HDL Cholesterol: 53 mg/dL     Total Cholesterol: 157 mg/dL    Depression screen [facility] 2/9 04/18/2020 01/24/2017 03/23/2016  Decreased Interest '2 1 2  ' Down, Depressed, Hopeless '2 2 1  ' PHQ - 2 Score '4 3 3  ' Altered sleeping '3 3 1  ' Tired,  decreased energy '3 3 1  ' Change in appetite 3 3 0  Feeling bad or failure about yourself  2 2 0  Trouble concentrating '2 2 1  ' Moving slowly or fidgety/restless 0 1 0  Suicidal thoughts 0 0 0  PHQ-9 Score '17 17 6  ' Difficult doing work/chores Very difficult Somewhat difficult Somewhat difficult      Social History   Tobacco Use  Smoking Status Current Some Day Smoker  . Packs/day: 0.50  . Years: 52.00  . Pack years: 26.00  . Types: Cigarettes  Smokeless Tobacco Never Used   BP Readings from Last 3 Encounters:  04/18/20 138/88  09/07/19 130/68  05/21/19 128/68   Pulse Readings from Last 3 Encounters:  04/18/20 90  09/07/19 70  05/21/19 90   Wt Readings from Last 3 Encounters:  04/18/20 213 lb (96.6 kg)  09/07/19 215 lb (97.5 kg)  05/21/19 214 lb (97.1 kg)   BMI Readings from Last 3 Encounters:  04/18/20 35.45 kg/m  09/07/19 35.78 kg/m  05/21/19 35.61 kg/m    Assessment/Interventions: Review of patient past medical history, allergies, medications, health status, including review of consultants reports, laboratory and other test data, was performed as part of comprehensive evaluation and provision of chronic care management services.   SDOH:  (Social Determinants of Health) assessments and interventions performed: Yes  SDOH Screenings   Alcohol Screen: Low Risk   . Last Alcohol Screening Score (AUDIT): 0  Depression (PHQ2-9): Medium Risk  . PHQ-2 Score: 17  Financial Resource Strain: Not on file  Food Insecurity: Not on file  Housing: Not on file  Physical Activity: Not on file  Social Connections: Not on file  Stress: Not on file  Tobacco Use: High Risk  . Smoking Tobacco Use: Current Some Day Smoker  . Smokeless Tobacco Use: Never Used  Transportation Needs: Not on file    CCM Care Plan  Allergies  Allergen Reactions  . Codeine Itching  . Flonase [Fluticasone Propionate] Cough    Severe coughing  . Metformin And Related Diarrhea    Caused severe  diarrhea and severe GI symptoms---even on low dose 555m BID    Medications Reviewed Today    Reviewed by [facility]Ed[facility]the [facility]arity [facility] on 04/25/20 at 1422  Med List Status: <None>  Medication Order Taking? Sig Documenting Provider Last Dose Status Informant  albuterol (VENTOLIN HFA) 108 (90 Base) MCG/ACT inhaler [id number] INHALE 2 PUFFS EVERY 4 TO 6 HOURS AS NEEDED FOR WHEEZING AND SHORTNESS OF BREATH. PSusy Frizzle MD Taking Active   atorvastatin (LIPITOR) 80 MG tablet [id number] TAKE 1 TABLET EVERY DAY PJenna Luo  T, MD Taking Active   Blood Glucose Monitoring Suppl (ACCU-CHEK AVIVA PLUS) w/Device KIT [id number] Yes Use as directed to monitor FSBS 2x daily. Dx: E11.9 Susy Frizzle, MD Taking Active   fluticasone Asencion Islam) 50 MCG/ACT nasal spray [id number] Yes USE 2 SPRAYS IN [facility] NOSTRIL EVERY DAY Susy Frizzle, MD Taking Active   gabapentin (NEURONTIN) 300 MG capsule [id number] Yes Take 1 capsule (300 mg total) by mouth 3 (three) times daily. Susy Frizzle, MD Taking Active   glucose blood (ACCU-CHEK AVIVA PLUS) test strip [id number] Yes USE AS DIRECTED TO MONITOR FINGER STICK BLOOD SUGAR 2 TIMES DAILY Susy Frizzle, MD Taking Active   HYDROcodone-acetaminophen (NORCO/VICODIN) 5-325 MG tablet [id number] Yes One tablet every six hours for pain.  Limit 7 days. Sanjuana Kava, MD Taking Active   levocetirizine (XYZAL) 5 MG tablet [id number] Yes Take 1 tablet (5 mg total) by mouth every evening. Susy Frizzle, MD Taking Active   metoprolol succinate (TOPROL-XL) 25 MG 24 hr tablet [id number] Yes TAKE 1 TABLET EVERY DAY Pickard, Angela Mcgee, MD Taking Active   montelukast (SINGULAIR) 10 MG tablet [id number] Yes Take 1 tablet (10 mg total) by mouth at bedtime. Susy Frizzle, MD Taking Active   pioglitazone (ACTOS) 15 MG tablet [id number] Yes Take 1 tablet (15 mg total) by mouth daily. Susy Frizzle, MD Taking Active   Polyethyl Glycol-Propyl Glycol [facility] OP)  [id number] Yes Apply 1 drop to eye 3 (three) times daily. [provider] Taking Active Self  SYMBICORT 160-4.5 MCG/ACT inhaler [id number] Yes INHALE 2 PUFFS INTO THE LUNGS TWICE DAILY. Susy Frizzle, MD Taking Active   traZODone (DESYREL) 50 MG tablet [id number] Yes TAKE 3 TABLETS BY MOUTH AT BEDTIME AS NEEDED FOR SLEEP. Susy Frizzle, MD Taking Active   venlafaxine XR (EFFEXOR-XR) 75 MG 24 hr capsule [id number] Yes Take 2 capsules (150 mg total) by mouth daily. Susy Frizzle, MD Taking Active           Patient Active Problem List   Diagnosis Date Noted  . Anxiety and depression 05/02/2017  . Helicobacter pylori gastritis 01/18/2017  . Abdominal pain, epigastric 09/21/2016  . RUQ pain 09/21/2016  . Hx of adenomatous colonic polyps 09/21/2016  . GERD (gastroesophageal reflux disease) 09/21/2016  . Chronic kidney disease (CKD), stage III (moderate) ([place]) 12/07/2015  . Osteochondral lesion of talar dome 10/21/2015  . Panic disorder 03/28/2015  . Tobacco use disorder 01/12/2015  . Foot pain, bilateral 01/12/2015  . COPD (chronic obstructive pulmonary disease) ([place])   . Depression   . Diabetes mellitus without complication ([place])   . Hypercholesteremia   . Hypertension   . Anxiety 02/27/2013  . PERSISTENT DISORDER INITIATING/MAINTAINING SLEEP 11/26/2008  . Obstructive sleep apnea 11/26/2008  . HYPOXEMIA 11/26/2008  . HIP PAIN 03/10/2007  . [place] DEGENERATION 03/10/2007  . BURSITIS, HIP 03/10/2007    Immunization History  Administered Date(s) Administered  . Fluad Quad(high Dose 65+) 12/11/2018  . Influenza, High Dose Seasonal PF 12/24/2016, 12/09/2017  . Influenza,inj,Quad PF,6+ Mos 01/12/2015, 12/07/2015  . Influenza-Unspecified 02/27/2013  . Pneumococcal Conjugate-13 01/23/2004, 01/12/2015  . Tdap 11/30/2008    Conditions to be addressed/monitored:  HTN, COPD, GERD, Diabetes, CKD, Depression/Anxiety, hypercholesterolemia,.  Care Plan : General Pharmacy  (Adult)  Updates made by Edythe Clarity, RPH since 04/25/2020 12:00 AM    Problem: HTN, HLD, COPD, DM   Priority: High  Onset Date: 04/25/2020    Goal: Patient-Specific Goal   Start Date: 04/25/2020  Expected End Date: 10/25/2020  This Visit's Progress: On track  Priority: High  Note:   Current Barriers:  . Unable to independently afford treatment regimen  Pharmacist Clinical Goal(s):  Marland Kitchen Patient will verbalize ability to afford treatment regimen . maintain control of blood glucose and blood pressure as evidenced by home monitiring  . contact provider office for questions/concerns as evidenced notation of same in electronic health record through collaboration with PharmD and provider.   Interventions: . 1:1 collaboration with Susy Frizzle, MD regarding development and update of comprehensive plan of care as evidenced by provider attestation and co-signature . Inter-disciplinary care team collaboration (see longitudinal plan of care) . Comprehensive medication review performed; medication list updated in electronic medical record  Hypertension (BP goal <130/80) -Controlled -Current treatment:  Metoprolol XL 66m daily -Medications previously tried: lisinopril (d/c) -Current home readings: no logs available  -Current exercise habits: minimal lately, patient having pain that limits physical activity, able to do ADLs with no concern -Reports hypotensive/hypertensive symptoms - dizziness, possible vertigo seems to be unrelated to BP -Educated on BP goals and benefits of medications for prevention of heart attack, stroke and kidney damage; Importance of home blood pressure monitoring; -Counseled to monitor BP at home once or twice weekly, document, and provide log at future appointments -Recommended to continue current medication  Hyperlipidemia: (LDL goal < 100) -Controlled -Current treatment:  Atorvastatin 870m-Medications previously tried: none noted  -Educated on  Cholesterol goals;  Benefits of statin for ASCVD risk reduction; Importance of limiting foods high in cholesterol;  -Reviewed most recent lipid panel -Recommended to continue current medication  Diabetes (A1c goal <7%) -Controlled -Current medications:  Pioglitazone 1556maily -Medications previously tried: metformin (GI issues)  -Current home glucose readings . fasting glucose: 109-116 . post prandial glucose: not available -Denies hypoglycemic/hyperglycemic symptoms -She denies any recent episodes of hypoglycemia -Educated on A1c and blood sugar goals; Prevention and management of hypoglycemic episodes; Benefits of routine self-monitoring of blood sugar; -Counseled to check feet daily and get yearly eye exams -Recommended to continue current medication Consider d/c of pioglitazone pending next A1c.  If A1c < 6.0 she may could trial off pioglitazone to manage hypoglycemia risk.  COPD (Goal: control symptoms and prevent exacerbations) -Controlled -Current treatment   Symbicort 160-4.5mc75mVentolin HFA 90mc44mn -Medications previously tried: none noted  -Exacerbations requiring treatment in last 6 months: none -Patient denies consistent use of maintenance inhaler - still only using it once daily due to cost concern.  -Frequency of rescue inhaler use: daily prn -Counseled on Proper inhaler technique; Benefits of consistent maintenance inhaler use Differences between maintenance and rescue inhalers -Recommended to continue current medication Assessed patient finances. Will have Veronica reach out to her and help with process of applying for PAP for Symbicort.   Patient Goals/Self-Care Activities . Patient will:  - take medications as prescribed check glucose daily, document, and provide at future appointments check blood pressure a few times per week, document, and provide at future appointments collaborate with provider on medication access solutions  Follow Up Plan:  The care management team will reach out to the patient again over the next 90 days.        Medication Assistance: Application for Symbicort  medication assistance program. in process.  Anticipated assistance start date upon completion of application.  See plan of care for additional detail.  Patient[facility] [place]   [place] Phone: [phone] Fax: [phone]  [facility] [facility] [place] [address] [place] [address] Phone: [phone] Fax: [phone]  [id number] [place], [place] - [id number] [id number]: [phone]: [phone] Uses pill box? Yes Pt endorses 100% compliance  We discussed: Benefits of medication synchronization, packaging and delivery as well as enhanced pharmacist oversight with [employer]. Patient decided to: Continue current medication management strategy  Care Plan and Follow Up Patient Decision:  Patient agrees to Care Plan and Follow-up.  Plan: The care management team will reach out to the patient again over the next 90 days.  CBeverly Milch PharmD Clinical Pharmacist [place]([phone]

## 2020-04-25 ENCOUNTER — Ambulatory Visit (INDEPENDENT_AMBULATORY_CARE_PROVIDER_SITE_OTHER): Payer: Medicare HMO | Admitting: Pharmacist

## 2020-04-25 DIAGNOSIS — J439 Emphysema, unspecified: Secondary | ICD-10-CM

## 2020-04-25 DIAGNOSIS — I10 Essential (primary) hypertension: Secondary | ICD-10-CM | POA: Diagnosis not present

## 2020-04-25 DIAGNOSIS — E7800 Pure hypercholesterolemia, unspecified: Secondary | ICD-10-CM

## 2020-04-25 DIAGNOSIS — E119 Type 2 diabetes mellitus without complications: Secondary | ICD-10-CM

## 2020-04-25 NOTE — Patient Instructions (Addendum)
Visit Information  Goals Addressed            This Visit's Progress   . Track and Manage My Symptoms-COPD       Timeframe:  Long-Range Goal Priority:  High Start Date:  04/25/20                           Expected End Date:  10/25/20                     Follow Up Date 07/25/20   - eliminate symptom triggers at home - follow rescue plan if symptoms flare-up - keep follow-up appointments  -Await assistance from CCM team with patient assistance application   Why is this important?    Tracking your symptoms and other information about your health helps your doctor plan your care.   Write down the symptoms, the time of day, what you were doing and what medicine you are taking.   You will soon learn how to manage your symptoms.     Notes: Wants to take twice daily, cost is issue.      Patient Care Plan: General Pharmacy (Adult)    Problem Identified: HTN, HLD, COPD, DM   Priority: High  Onset Date: 04/25/2020    Goal: Patient-Specific Goal   Start Date: 04/25/2020  Expected End Date: 10/25/2020  This Visit's Progress: On track  Priority: High  Note:   Current Barriers:  . Unable to independently afford treatment regimen  Pharmacist Clinical Goal(s):  Marland Kitchen Patient will verbalize ability to afford treatment regimen . maintain control of blood glucose and blood pressure as evidenced by home monitiring  . contact provider office for questions/concerns as evidenced notation of same in electronic health record through collaboration with PharmD and provider.   Interventions: . 1:1 collaboration with Susy Frizzle, MD regarding development and update of comprehensive plan of care as evidenced by provider attestation and co-signature . Inter-disciplinary care team collaboration (see longitudinal plan of care) . Comprehensive medication review performed; medication list updated in electronic medical record  Hypertension (BP goal <130/80) -Controlled -Current  treatment:  Metoprolol XL 25mg  daily -Medications previously tried: lisinopril (d/c) -Current home readings: no logs available  -Current exercise habits: minimal lately, patient having pain that limits physical activity, able to do ADLs with no concern -Reports hypotensive/hypertensive symptoms - dizziness, possible vertigo seems to be unrelated to BP -Educated on BP goals and benefits of medications for prevention of heart attack, stroke and kidney damage; Importance of home blood pressure monitoring; -Counseled to monitor BP at home once or twice weekly, document, and provide log at future appointments -Recommended to continue current medication  Hyperlipidemia: (LDL goal < 100) -Controlled -Current treatment:  Atorvastatin 80mg  -Medications previously tried: none noted  -Educated on Cholesterol goals;  Benefits of statin for ASCVD risk reduction; Importance of limiting foods high in cholesterol;  -Reviewed most recent lipid panel -Recommended to continue current medication  Diabetes (A1c goal <7%) -Controlled -Current medications:  Pioglitazone 15mg  daily -Medications previously tried: metformin (GI issues)  -Current home glucose readings . fasting glucose: 109-116 . post prandial glucose: not available -Denies hypoglycemic/hyperglycemic symptoms -She denies any recent episodes of hypoglycemia -Educated on A1c and blood sugar goals; Prevention and management of hypoglycemic episodes; Benefits of routine self-monitoring of blood sugar; -Counseled to check feet daily and get yearly eye exams -Recommended to continue current medication Consider d/c of pioglitazone pending next A1c.  If  A1c < 6.0 she may could trial off pioglitazone to manage hypoglycemia risk.  COPD (Goal: control symptoms and prevent exacerbations) -Controlled -Current treatment   Symbicort 160-4.109mcg  Ventolin HFA 68mcg prn -Medications previously tried: none noted  -Exacerbations requiring  treatment in last 6 months: none -Patient denies consistent use of maintenance inhaler - still only using it once daily due to cost concern.  -Frequency of rescue inhaler use: daily prn -Counseled on Proper inhaler technique; Benefits of consistent maintenance inhaler use Differences between maintenance and rescue inhalers -Recommended to continue current medication Assessed patient finances. Will have Veronica reach out to her and help with process of applying for PAP for Symbicort.   Patient Goals/Self-Care Activities . Patient will:  - take medications as prescribed check glucose daily, document, and provide at future appointments check blood pressure a few times per week, document, and provide at future appointments collaborate with provider on medication access solutions  Follow Up Plan: The care management team will reach out to the patient again over the next 90 days.        The patient verbalized understanding of instructions, educational materials, and care plan provided today and agreed to receive a mailed copy of patient instructions, educational materials, and care plan.  Telephone follow up appointment with pharmacy team member scheduled for: 3 months  [name], [facility]  How to Take Your Blood Pressure Blood pressure measures how strongly your blood is pressing against the walls of your arteries. Arteries are blood vessels that carry blood from your heart throughout your body. You can take your blood pressure at home with a machine. You may need to check your blood pressure at home:  To check if you have high blood pressure (hypertension).  To check your blood pressure over time.  To make sure your blood pressure medicine is working. Supplies needed:  Blood pressure machine, or monitor.  Dining room chair to sit in.  Table or desk.  Small notebook.  Pencil or pen. How to prepare Avoid these things for 30 minutes before checking your blood  pressure:  Having drinks with caffeine in them, such as coffee or tea.  Drinking alcohol.  Eating.  Smoking.  Exercising. Do these things five minutes before checking your blood pressure:  Go to the bathroom and pee (urinate).  Sit in a dining chair. Do not sit in a soft couch or an armchair.  Be quiet. Do not talk. How to take your blood pressure Follow the instructions that came with your machine. If you have a digital blood pressure monitor, these may be the instructions: 1. Sit up straight. 2. Place your feet on the floor. Do not cross your ankles or legs. 3. Rest your left arm at the level of your heart. You may rest it on a table, desk, or chair. 4. Pull up your shirt sleeve. 5. Wrap the blood pressure cuff around the upper part of your left arm. The cuff should be 1 inch (2.5 cm) above your elbow. It is best to wrap the cuff around bare skin. 6. Fit the cuff snugly around your arm. You should be able to place only one finger between the cuff and your arm. 7. Place the cord so that it rests in the bend of your elbow. 8. Press the power button. 9. Sit quietly while the cuff fills with air and loses air. 10. Write down the numbers on the screen. 11. Wait 2-3 minutes and then repeat steps 1-10.   What  do the numbers mean? Two numbers make up your blood pressure. The first number is called systolic pressure. The second is called diastolic pressure. An example of a blood pressure reading is "120 over 80" (or 120/80). If you are an adult and do not have a medical condition, use this guide to find out if your blood pressure is normal: Normal  First number: below 120.  Second number: below 80. Elevated  First number: 120-129.  Second number: below 80. Hypertension stage 1  First number: 130-139.  Second number: 80-89. Hypertension stage 2  First number: 140 or above.  Second number: 55 or above. Your blood pressure is above normal even if only the top or bottom  number is above normal. Follow these instructions at home:  Check your blood pressure as often as your doctor tells you to.  Check your blood pressure at the same time every day.  Take your monitor to your next doctor's appointment. Your doctor will: ? Make sure you are using it correctly. ? Make sure it is working right.  Make sure you understand what your blood pressure numbers should be.  Tell your doctor if your medicine is causing side effects.  Keep all follow-up visits as told by your doctor. This is important. General tips:  You will need a blood pressure machine, or monitor. Your doctor can suggest a monitor. You can buy one at a drugstore or online. When choosing one: ? Choose one with an arm cuff. ? Choose one that wraps around your upper arm. Only one finger should fit between your arm and the cuff. ? Do not choose one that measures your blood pressure from your wrist or finger. Where to find more information American Heart Association: www.heart.org Contact a doctor if:  Your blood pressure keeps being high. Get help right away if:  Your first blood pressure number is higher than 180.  Your second blood pressure number is higher than 120. Summary  Check your blood pressure at the same time every day.  Avoid caffeine, alcohol, smoking, and exercise for 30 minutes before checking your blood pressure.  Make sure you understand what your blood pressure numbers should be. This information is not intended to replace advice given to you by your health care provider. Make sure you discuss any questions you have with your health care provider. Document Revised: 01/02/2019 Document Reviewed: 01/02/2019 Elsevier Patient Education  2021 [employer].

## 2020-04-26 ENCOUNTER — Telehealth: Payer: Self-pay | Admitting: Pharmacist

## 2020-04-26 NOTE — Progress Notes (Addendum)
    Chronic Care Management Pharmacy Assistant   Name: DALONDA SIMONI  MRN: [id number] DOB: Aug 29, 1945  Reason for Encounter: PAP  Medications: Outpatient Encounter Medications as of 04/26/2020  Medication Sig   albuterol (VENTOLIN HFA) 108 (90 Base) MCG/ACT inhaler INHALE 2 PUFFS EVERY 4 TO 6 HOURS AS NEEDED FOR WHEEZING AND SHORTNESS OF BREATH.   atorvastatin (LIPITOR) 80 MG tablet TAKE 1 TABLET EVERY DAY   Blood Glucose Monitoring Suppl (ACCU-CHEK AVIVA PLUS) w/Device KIT Use as directed to monitor FSBS 2x daily. Dx: E11.9   fluticasone (FLONASE) 50 MCG/ACT nasal spray USE 2 SPRAYS IN EACH NOSTRIL EVERY DAY   gabapentin (NEURONTIN) 300 MG capsule Take 1 capsule (300 mg total) by mouth 3 (three) times daily.   glucose blood (ACCU-CHEK AVIVA PLUS) test strip USE AS DIRECTED TO MONITOR FINGER STICK BLOOD SUGAR 2 TIMES DAILY   HYDROcodone-acetaminophen (NORCO/VICODIN) 5-325 MG tablet One tablet every six hours for pain.  Limit 7 days.   levocetirizine (XYZAL) 5 MG tablet Take 1 tablet (5 mg total) by mouth every evening.   metoprolol succinate (TOPROL-XL) 25 MG 24 hr tablet TAKE 1 TABLET EVERY DAY   montelukast (SINGULAIR) 10 MG tablet Take 1 tablet (10 mg total) by mouth at bedtime.   pioglitazone (ACTOS) 15 MG tablet Take 1 tablet (15 mg total) by mouth daily.   Polyethyl Glycol-Propyl Glycol (SYSTANE OP) Apply 1 drop to eye 3 (three) times daily.   SYMBICORT 160-4.5 MCG/ACT inhaler INHALE 2 PUFFS INTO THE LUNGS TWICE DAILY.   traZODone (DESYREL) 50 MG tablet TAKE 3 TABLETS BY MOUTH AT BEDTIME AS NEEDED FOR SLEEP.   venlafaxine XR (EFFEXOR-XR) 75 MG 24 hr capsule Take 2 capsules (150 mg total) by mouth daily.   No facility-administered encounter medications on file as of 04/26/2020.   New patient assistance application form filled out to [employer] for Symbicort 160-4.5 MCG/ACT. Waiting for patient and provider to complete and sign documentation. Called patient to inquire if they wanted the  application mailed to them or if they wanted to come into the office. Patient is required to sign application and to bring/have proof of income. She stated she would be willing to come into office to bring proof of income and sign application once she receives the application in the mail. She would like it mailed to their residence address [place] [address].   Follow-Up:Pharmacist Review  [name], RMA Clinical Pharmacist Assistant [phone]  3 minutes spent in review, coordination, and documentation.  Reviewed by: [name], PharmD Clinical Pharmacist [facility] [phone]

## 2020-04-27 ENCOUNTER — Other Ambulatory Visit: Payer: Self-pay | Admitting: Family Medicine

## 2020-05-09 ENCOUNTER — Telehealth: Payer: Self-pay | Admitting: Family Medicine

## 2020-05-09 ENCOUNTER — Other Ambulatory Visit: Payer: Self-pay

## 2020-05-09 DIAGNOSIS — J209 Acute bronchitis, unspecified: Secondary | ICD-10-CM

## 2020-05-09 MED ORDER — BUDESONIDE-FORMOTEROL FUMARATE 160-4.5 MCG/ACT IN AERO: 2.0000 | INHALATION_SPRAY | Freq: Two times a day (BID) | RESPIRATORY_TRACT | 5 refills | Status: DC

## 2020-05-09 NOTE — Telephone Encounter (Signed)
Patient called to follow up on request for refill of   SYMBICORT 160-4.5 MCG/ACT inhaler [[id number]]    Refill sent to wrong pharmacy. Please resend to   Pharmacy:  [id number] - [place], [place] - [address]  [address], [place] [address]  Phone:  [phone] Fax:  [phone]  DEA #:  [id number]  Patient has been without medication since last Monday. Please advise patient when Rx called in at [phone]

## 2020-05-09 NOTE — Telephone Encounter (Signed)
Prescription sent to pharmacy.

## 2020-05-11 ENCOUNTER — Other Ambulatory Visit: Payer: Self-pay | Admitting: Family Medicine

## 2020-05-26 ENCOUNTER — Telehealth: Payer: Self-pay | Admitting: Family Medicine

## 2020-05-26 NOTE — Telephone Encounter (Signed)
Forms completed and awaiting signature.

## 2020-05-26 NOTE — Telephone Encounter (Signed)
Patient dropped off paperwork for provider. Placed paperwork in Christina's folder at her desk.

## 2020-06-02 NOTE — Telephone Encounter (Signed)
Completed forms mailed to patient.

## 2020-06-09 ENCOUNTER — Telehealth: Payer: Self-pay | Admitting: Pharmacist

## 2020-06-09 NOTE — Progress Notes (Addendum)
Chronic Care Management Pharmacy Assistant   Name: [name]  MRN: [id number] DOB: 10-26-1945  Reason for Encounter: Disease State For DM   Conditions to be addressed/monitored: HTN, COPD, GERD, Diabetes, CKD, Depression/Anxiety, hypercholesterolemia,.  Recent office visits:  None since 04/26/20  Recent consult visits:  None since 04/26/20  Hospital visits:  None since 04/26/20  Medications: Outpatient Encounter Medications as of 06/09/2020  Medication Sig   traZODone (DESYREL) 50 MG tablet TAKE 3 TABLETS BY MOUTH AT BEDTIME AS NEEDED FOR SLEEP.   albuterol (VENTOLIN HFA) 108 (90 Base) MCG/ACT inhaler INHALE 2 PUFFS EVERY 4 TO 6 HOURS AS NEEDED FOR WHEEZING AND SHORTNESS OF BREATH.   atorvastatin (LIPITOR) 80 MG tablet TAKE 1 TABLET EVERY DAY   Blood Glucose Monitoring Suppl (ACCU-CHEK AVIVA PLUS) w/Device KIT Use as directed to monitor FSBS 2x daily. Dx: E11.9   budesonide-formoterol (SYMBICORT) 160-4.5 MCG/ACT inhaler Inhale 2 puffs into the lungs 2 (two) times daily.   fluticasone (FLONASE) 50 MCG/ACT nasal spray USE 2 SPRAYS IN EACH NOSTRIL EVERY DAY   gabapentin (NEURONTIN) 300 MG capsule Take 1 capsule (300 mg total) by mouth 3 (three) times daily.   glucose blood (ACCU-CHEK AVIVA PLUS) test strip USE AS DIRECTED TO MONITOR FINGER STICK BLOOD SUGAR 2 TIMES DAILY   HYDROcodone-acetaminophen (NORCO/VICODIN) 5-325 MG tablet One tablet every six hours for pain.  Limit 7 days.   levocetirizine (XYZAL) 5 MG tablet Take 1 tablet (5 mg total) by mouth every evening.   metoprolol succinate (TOPROL-XL) 25 MG 24 hr tablet TAKE 1 TABLET EVERY DAY   montelukast (SINGULAIR) 10 MG tablet TAKE 1 TABLET (10 MG TOTAL) BY MOUTH AT BEDTIME.   pioglitazone (ACTOS) 15 MG tablet Take 1 tablet (15 mg total) by mouth daily.   Polyethyl Glycol-Propyl Glycol (SYSTANE OP) Apply 1 drop to eye 3 (three) times daily.   venlafaxine XR (EFFEXOR-XR) 75 MG 24 hr capsule Take 2 capsules (150 mg total) by  mouth daily.   No facility-administered encounter medications on file as of 06/09/2020.    Recent Relevant Labs: Lab Results  Component Value Date/Time   HGBA1C 6.0 (H) 04/18/2020 02:57 PM   HGBA1C 6.0 (H) 09/07/2019 02:58 PM   HGBA1C 6.1 (H) 12/07/2015 11:51 AM   MICROALBUR 1.1 09/07/2019 02:58 PM   MICROALBUR 0.5 01/24/2017 12:28 PM    Kidney Function Lab Results  Component Value Date/Time   CREATININE 0.95 (H) 04/18/2020 02:57 PM   CREATININE 1.24 (H) 09/07/2019 02:58 PM   GFRNONAA 60 04/18/2020 02:57 PM   GFRAA 69 04/18/2020 02:57 PM    Current antihyperglycemic regimen:  Gabapentin 300 mg 1 capsule daily 3 times daily Pioglitazone 15 mg daily   What recent interventions/DTPs have been made to improve glycemic control:  None.  Have there been any recent hospitalizations or ED visits since last visit with CPP? Patient stated no.  Patient denies hypoglycemic symptoms, including Nervous/irritable   Patient denies hyperglycemic symptoms, including fatigue and polyuria   How often are you checking your blood sugar? twice daily   What are your blood sugars ranging? Patient stated her range is between 100-120  During the week, how often does your blood glucose drop below 70? Patient stated Never   Are you checking your feet daily/regularly?  Patient stated stated she checks her feet regularly.   Adherence Review: Is the patient currently on a STATIN medication? Atorvastatin 80 mg  Is the patient currently on ACE/ARB medication? None  Does the  patient have >5 day gap between last estimated fill dates? Per misc rtps, no.  [facility] Rating Drugs: Atorvastatin 80 mg 90 DS 04/18/20  Gabapentin 300 mg Pioglitazone 15 mg   Spoke with the [facility] her application for her Symbicort never made it to them. We dicussed the patient calling herself and the patient agreed. She was informed that if she has any problems she can call me back and we can all do it together.    Follow-Up:Pharmacist Review   [name], RMA Clinical Pharmacist Assistant [phone]  10 minutes spent in review, coordination, and documentation.  Reviewed by: [name], PharmD Clinical Pharmacist [facility] [phone]

## 2020-06-13 ENCOUNTER — Other Ambulatory Visit: Payer: Self-pay | Admitting: Family Medicine

## 2020-06-15 ENCOUNTER — Other Ambulatory Visit: Payer: Self-pay | Admitting: *Deleted

## 2020-06-15 MED ORDER — BUDESONIDE-FORMOTEROL FUMARATE 160-4.5 MCG/ACT IN AERO: 2.0000 | INHALATION_SPRAY | Freq: Two times a day (BID) | RESPIRATORY_TRACT | 5 refills | Status: DC

## 2020-06-17 ENCOUNTER — Other Ambulatory Visit: Payer: Self-pay | Admitting: Family Medicine

## 2020-06-17 DIAGNOSIS — Z139 Encounter for screening, unspecified: Secondary | ICD-10-CM

## 2020-06-21 ENCOUNTER — Telehealth: Payer: Self-pay | Admitting: Pharmacist

## 2020-06-21 NOTE — Chronic Care Management (AMB) (Signed)
    Chronic Care Management   Name: [name]  MRN: [id number] DOB: 09/02/1945  Reason for Encounter: Chart/Adherence Review  Recent office visits:  None since last Ccm call  Recent consult visits:  None since last Ccm call  Hospital visits:  None in previous 6 months  Medications: Outpatient Encounter Medications as of 06/21/2020  Medication Sig  . traZODone (DESYREL) 50 MG tablet TAKE 3 TABLETS BY MOUTH AT BEDTIME AS NEEDED FOR SLEEP.  Marland Kitchen albuterol (VENTOLIN HFA) 108 (90 Base) MCG/ACT inhaler INHALE 2 PUFFS EVERY 4 TO 6 HOURS AS NEEDED FOR WHEEZING AND SHORTNESS OF BREATH.  Marland Kitchen atorvastatin (LIPITOR) 80 MG tablet TAKE 1 TABLET EVERY DAY  . Blood Glucose Monitoring Suppl (ACCU-CHEK AVIVA PLUS) w/Device KIT Use as directed to monitor FSBS 2x daily. Dx: E11.9  . budesonide-formoterol (SYMBICORT) 160-4.5 MCG/ACT inhaler Inhale 2 puffs into the lungs 2 (two) times daily.  . fluticasone (FLONASE) 50 MCG/ACT nasal spray USE 2 SPRAYS IN EACH NOSTRIL EVERY DAY  . gabapentin (NEURONTIN) 300 MG capsule Take 1 capsule (300 mg total) by mouth 3 (three) times daily.  Marland Kitchen glucose blood (ACCU-CHEK AVIVA PLUS) test strip USE AS DIRECTED TO MONITOR FINGER STICK BLOOD SUGAR 2 TIMES DAILY  . HYDROcodone-acetaminophen (NORCO/VICODIN) 5-325 MG tablet One tablet every six hours for pain.  Limit 7 days.  Marland Kitchen levocetirizine (XYZAL) 5 MG tablet Take 1 tablet (5 mg total) by mouth every evening.  . metoprolol succinate (TOPROL-XL) 25 MG 24 hr tablet TAKE 1 TABLET EVERY DAY  . montelukast (SINGULAIR) 10 MG tablet TAKE 1 TABLET (10 MG TOTAL) BY MOUTH AT BEDTIME.  . pioglitazone (ACTOS) 15 MG tablet TAKE 1 TABLET EVERY DAY  . Polyethyl Glycol-Propyl Glycol (SYSTANE OP) Apply 1 drop to eye 3 (three) times daily.  Marland Kitchen venlafaxine XR (EFFEXOR-XR) 75 MG 24 hr capsule Take 2 capsules (150 mg total) by mouth daily.   No facility-administered encounter medications on file as of 06/21/2020.    Care Gaps: Needs AWV  not date for last AWV found.   [facility] Rating Drugs: Atorvastatin 80mg  04/18/20 90 ds Pioglitazone 15mg  04/12/20 90 ds  No recent changes to medication noted.  [name], PharmD Clinical Pharmacist [facility] [phone]

## 2020-06-24 ENCOUNTER — Other Ambulatory Visit: Payer: Self-pay | Admitting: Family Medicine

## 2020-06-24 ENCOUNTER — Ambulatory Visit
Admission: RE | Admit: 2020-06-24 | Discharge: 2020-06-24 | Disposition: A | Discharge status: Home / Self Care | Payer: Medicare HMO | Source: Ambulatory Visit | Attending: Family Medicine | Admitting: Family Medicine

## 2020-06-24 ENCOUNTER — Other Ambulatory Visit: Payer: Self-pay

## 2020-06-24 DIAGNOSIS — Z1231 Encounter for screening mammogram for malignant neoplasm of breast: Secondary | ICD-10-CM | POA: Diagnosis not present

## 2020-06-24 DIAGNOSIS — J301 Allergic rhinitis due to pollen: Secondary | ICD-10-CM

## 2020-06-24 DIAGNOSIS — Z139 Encounter for screening, unspecified: Secondary | ICD-10-CM

## 2020-06-24 DIAGNOSIS — M797 Fibromyalgia: Secondary | ICD-10-CM

## 2020-06-30 ENCOUNTER — Other Ambulatory Visit: Payer: Self-pay

## 2020-06-30 ENCOUNTER — Encounter: Payer: Self-pay | Admitting: *Deleted

## 2020-06-30 MED ORDER — ACCU-CHEK AVIVA VI SOLN: 0 refills | Status: DC

## 2020-06-30 MED ORDER — ALCOHOL SWABS 70 % PADS: MEDICATED_PAD | 1 refills | Status: DC

## 2020-06-30 MED ORDER — ACCU-CHEK SOFTCLIX LANCETS MISC: 12 refills | Status: DC

## 2020-07-01 ENCOUNTER — Other Ambulatory Visit: Payer: Self-pay | Admitting: *Deleted

## 2020-07-01 DIAGNOSIS — J209 Acute bronchitis, unspecified: Secondary | ICD-10-CM

## 2020-07-01 MED ORDER — ACCU-CHEK AVIVA PLUS VI STRP: ORAL_STRIP | 3 refills | Status: DC

## 2020-07-01 MED ORDER — ACCU-CHEK SOFTCLIX LANCETS MISC: 3 refills | Status: AC

## 2020-07-01 MED ORDER — ACCU-CHEK AVIVA VI SOLN: 0 refills | Status: AC

## 2020-07-01 MED ORDER — ACCU-CHEK AVIVA PLUS W/DEVICE KIT: PACK | 1 refills | Status: AC

## 2020-07-01 MED ORDER — ALBUTEROL SULFATE HFA 108 (90 BASE) MCG/ACT IN AERS: INHALATION_SPRAY | RESPIRATORY_TRACT | 3 refills | Status: DC

## 2020-07-01 MED ORDER — ALCOHOL SWABS 70 % PADS: MEDICATED_PAD | 1 refills | Status: DC

## 2020-07-15 ENCOUNTER — Other Ambulatory Visit: Payer: Self-pay

## 2020-07-15 DIAGNOSIS — J209 Acute bronchitis, unspecified: Secondary | ICD-10-CM

## 2020-07-15 MED ORDER — ALBUTEROL SULFATE HFA 108 (90 BASE) MCG/ACT IN AERS: INHALATION_SPRAY | RESPIRATORY_TRACT | 3 refills | Status: DC

## 2020-07-20 ENCOUNTER — Other Ambulatory Visit: Payer: Self-pay | Admitting: *Deleted

## 2020-07-20 DIAGNOSIS — J209 Acute bronchitis, unspecified: Secondary | ICD-10-CM

## 2020-07-20 MED ORDER — VENTOLIN HFA 108 (90 BASE) MCG/ACT IN AERS: INHALATION_SPRAY | RESPIRATORY_TRACT | 3 refills | Status: DC

## 2020-07-26 NOTE — Progress Notes (Signed)
Chronic Care Management Pharmacy Note  08/01/2020 Name:  [name] MRN:  [id number] DOB:  October 02, 1945  Subjective: [name] is an 75 y.o. year old female who is a primary patient of Pickard, Cammie Mcgee, MD.  The CCM team was consulted for assistance with disease management and care coordination needs.    Engaged with patient by telephone for follow up visit in response to provider referral for pharmacy case management and/or care coordination services.   Consent to Services:  The patient was given the following information about Chronic Care Management services today, agreed to services, and gave verbal consent: 1. CCM service includes personalized support from designated clinical staff supervised by the primary care provider, including individualized plan of care and coordination with other care providers 2. 24/7 contact phone numbers for assistance for urgent and routine care needs. 3. Service will only be billed when office clinical staff spend 20 minutes or more in a month to coordinate care. 4. Only one practitioner may furnish and bill the service in a calendar month. 5.The patient may stop CCM services at any time (effective at the end of the month) by phone call to the office staff. 6. The patient will be responsible for cost sharing (co-pay) of up to 20% of the service fee (after annual deductible is met). Patient agreed to services and consent obtained.  Patient Care Team: Susy Frizzle, MD as PCP - General (Family Medicine) Gala Romney Cristopher Estimable, MD as Consulting Physician (Gastroenterology) Edythe Clarity, [facility] as Pharmacist (Pharmacist)  Recent office visits: None since last CCM visit  Recent consult visits: None since last CCM visit on 10/23/19  Hospital visits: None in previous 6 months  Objective:  Lab Results  Component Value Date   CREATININE 0.95 (H) 04/18/2020   BUN 17 04/18/2020   GFRNONAA 60 04/18/2020   GFRAA 69 04/18/2020   NA 140 04/18/2020    K 4.7 04/18/2020   CALCIUM 9.3 04/18/2020   CO2 28 04/18/2020   GLUCOSE 98 04/18/2020    Lab Results  Component Value Date/Time   HGBA1C 6.0 (H) 04/18/2020 02:57 PM   HGBA1C 6.0 (H) 09/07/2019 02:58 PM   HGBA1C 6.1 (H) 12/07/2015 11:51 AM   MICROALBUR 1.1 09/07/2019 02:58 PM   MICROALBUR 0.5 01/24/2017 12:28 PM    Last diabetic Eye exam: No results found for: HMDIABEYEEXA  Last diabetic Foot exam: No results found for: HMDIABFOOTEX   Lab Results  Component Value Date   CHOL 157 04/18/2020   HDL 53 04/18/2020   LDLCALC 83 04/18/2020   TRIG 117 04/18/2020   CHOLHDL 3.0 04/18/2020    Hepatic Function Latest Ref Rng & Units 04/18/2020 09/07/2019 05/21/2019  Total Protein 6.1 - 8.1 g/dL 7.3 7.0 7.1  Albumin 3.6 - 5.1 g/dL - - -  AST 10 - 35 U/L '24 24 25  ' ALT 6 - 29 U/L '17 18 19  ' Alk Phosphatase 33 - 130 U/L - - -  Total Bilirubin 0.2 - 1.2 mg/dL 0.4 0.4 0.3  Bilirubin, Direct <=0.2 mg/dL - - -    Lab Results  Component Value Date/Time   TSH 1.76 02/28/2015 09:56 AM    CBC Latest Ref Rng & Units 04/18/2020 05/21/2019 12/11/2018  WBC 3.8 - 10.8 Thousand/uL 12.7(H) 13.0(H) 14.1(H)  Hemoglobin 11.7 - 15.5 g/dL 12.3 13.3 11.8  Hematocrit 35.0 - 45.0 % 37.7 40.5 36.2  Platelets 140 - 400 Thousand/uL 326 388 350    No results found for: VD25OH  Clinical ASCVD: No  The 10-year ASCVD risk score Mikey Bussing DC Jr., et al., 2013) is: 43.4%   Values used to calculate the score:     Age: 60 years     Sex: Female     Is Non-Hispanic African American: No     Diabetic: Yes     Tobacco smoker: Yes     Systolic Blood Pressure: 169 mmHg     Is BP treated: Yes     HDL Cholesterol: 53 mg/dL     Total Cholesterol: 157 mg/dL    Depression screen [facility] 2/9 04/18/2020 01/24/2017 03/23/2016  Decreased Interest '2 1 2  ' Down, Depressed, Hopeless '2 2 1  ' PHQ - 2 Score '4 3 3  ' Altered sleeping '3 3 1  ' Tired, decreased energy '3 3 1  ' Change in appetite 3 3 0  Feeling bad or failure about yourself  2 2  0  Trouble concentrating '2 2 1  ' Moving slowly or fidgety/restless 0 1 0  Suicidal thoughts 0 0 0  PHQ-9 Score '17 17 6  ' Difficult doing work/chores Very difficult Somewhat difficult Somewhat difficult      Social History   Tobacco Use  Smoking Status Some Days   Packs/day: 0.50   Years: 52.00   Pack years: 26.00   Types: Cigarettes  Smokeless Tobacco Never   BP Readings from Last 3 Encounters:  04/18/20 138/88  09/07/19 130/68  05/21/19 128/68   Pulse Readings from Last 3 Encounters:  04/18/20 90  09/07/19 70  05/21/19 90   Wt Readings from Last 3 Encounters:  04/18/20 213 lb (96.6 kg)  09/07/19 215 lb (97.5 kg)  05/21/19 214 lb (97.1 kg)   BMI Readings from Last 3 Encounters:  04/18/20 35.45 kg/m  09/07/19 35.78 kg/m  05/21/19 35.61 kg/m    Assessment/Interventions: Review of patient past medical history, allergies, medications, health status, including review of consultants reports, laboratory and other test data, was performed as part of comprehensive evaluation and provision of chronic care management services.   SDOH:  (Social Determinants of Health) assessments and interventions performed: Yes  SDOH Screenings   Alcohol Screen: Low Risk    Last Alcohol Screening Score (AUDIT): 0  Depression (PHQ2-9): Medium Risk   PHQ-2 Score: 17  Financial Resource Strain: Not on file  Food Insecurity: Not on file  Housing: Not on file  Physical Activity: Not on file  Social Connections: Not on file  Stress: Not on file  Tobacco Use: High Risk   Smoking Tobacco Use: Some Days   Smokeless Tobacco Use: Never  Transportation Needs: Not on file    [place]  Allergies  Allergen Reactions   Codeine Itching   Flonase [Fluticasone Propionate] Cough    Severe coughing   Metformin And Related Diarrhea    Caused severe diarrhea and severe GI symptoms---even on low dose 584m BID    Medications Reviewed Today     Reviewed by [facility] (Ph[facility]ist) on 04/25/20 at 1422  Med List Status: <None>   Medication Order Taking? Sig Documenting Provider Last Dose Status Informant  albuterol (VENTOLIN HFA) 108 (90 Base) MCG/ACT inhaler [id number] INHALE 2 PUFFS EVERY 4 TO 6 HOURS AS NEEDED FOR WHEEZING AND SHORTNESS OF BREATH. PSusy Frizzle MD Taking Active   atorvastatin (LIPITOR) 80 MG tablet [id number] TAKE 1 TABLET EVERY DAY PSusy Frizzle MD Taking Active   Blood Glucose Monitoring Suppl (ACCU-CHEK AVIVA PLUS) w/Device KIT [id number] Use  as directed to monitor FSBS 2x daily. Dx: E11.9 Susy Frizzle, MD Taking Active   fluticasone Asencion Islam) 50 MCG/ACT nasal spray [id number] Yes USE 2 SPRAYS IN [facility] NOSTRIL EVERY DAY Susy Frizzle, MD Taking Active   gabapentin (NEURONTIN) 300 MG capsule [id number] Yes Take 1 capsule (300 mg total) by mouth 3 (three) times daily. Susy Frizzle, MD Taking Active   glucose blood (ACCU-CHEK AVIVA PLUS) test strip [id number] Yes USE AS DIRECTED TO MONITOR FINGER STICK BLOOD SUGAR 2 TIMES DAILY Susy Frizzle, MD Taking Active   HYDROcodone-acetaminophen (NORCO/VICODIN) 5-325 MG tablet [id number] Yes One tablet every six hours for pain.  Limit 7 days. Sanjuana Kava, MD Taking Active   levocetirizine (XYZAL) 5 MG tablet [id number] Yes Take 1 tablet (5 mg total) by mouth every evening. Susy Frizzle, MD Taking Active   metoprolol succinate (TOPROL-XL) 25 MG 24 hr tablet [id number] Yes TAKE 1 TABLET EVERY DAY Pickard, Cammie Mcgee, MD Taking Active   montelukast (SINGULAIR) 10 MG tablet [id number] Yes Take 1 tablet (10 mg total) by mouth at bedtime. Susy Frizzle, MD Taking Active   pioglitazone (ACTOS) 15 MG tablet [id number] Yes Take 1 tablet (15 mg total) by mouth daily. Susy Frizzle, MD Taking Active   Polyethyl Glycol-Propyl Glycol [facility] OP) [id number] Yes Apply 1 drop to eye 3 (three) times daily. [provider] Taking Active Self  SYMBICORT 160-4.5 MCG/ACT  inhaler [id number] Yes INHALE 2 PUFFS INTO THE LUNGS TWICE DAILY. Susy Frizzle, MD Taking Active   traZODone (DESYREL) 50 MG tablet [id number] Yes TAKE 3 TABLETS BY MOUTH AT BEDTIME AS NEEDED FOR SLEEP. Susy Frizzle, MD Taking Active   venlafaxine XR (EFFEXOR-XR) 75 MG 24 hr capsule [id number] Yes Take 2 capsules (150 mg total) by mouth daily. Susy Frizzle, MD Taking Active             Patient Active Problem List   Diagnosis Date Noted   Anxiety and depression 00/37/9444   Helicobacter pylori gastritis 01/18/2017   Abdominal pain, epigastric 09/21/2016   RUQ pain 09/21/2016   Hx of adenomatous colonic polyps 09/21/2016   GERD (gastroesophageal reflux disease) 09/21/2016   Chronic kidney disease (CKD), stage III (moderate) ([place]) 12/07/2015   Osteochondral lesion of talar dome 10/21/2015   Panic disorder 03/28/2015   Tobacco use disorder 01/12/2015   Foot pain, bilateral 01/12/2015   COPD (chronic obstructive pulmonary disease) ([place])    Depression    Diabetes mellitus without complication ([place])    Hypercholesteremia    Hypertension    Anxiety 02/27/2013   PERSISTENT DISORDER INITIATING/MAINTAINING SLEEP 11/26/2008   Obstructive sleep apnea 11/26/2008   HYPOXEMIA 11/26/2008   HIP PAIN 03/10/2007   [place] DEGENERATION 03/10/2007   BURSITIS, HIP 03/10/2007    Immunization History  Administered Date(s) Administered   Fluad Quad(high Dose 65+) 12/11/2018   Influenza, High Dose Seasonal PF 12/24/2016, 12/09/2017   Influenza,inj,Quad PF,6+ Mos 01/12/2015, 12/07/2015   Influenza-Unspecified 02/27/2013   Pneumococcal Conjugate-13 01/23/2004, 01/12/2015   Tdap 11/30/2008    Conditions to be addressed/monitored:  HTN, COPD, GERD, Diabetes, CKD, Depression/Anxiety, hypercholesterolemia,.  Care Plan : General Pharmacy (Adult)  Updates made by Edythe Clarity, RPH since 08/01/2020 12:00 AM     Problem: HTN, HLD, COPD, DM   Priority: High  Onset Date: 04/25/2020      Goal: Patient-Specific Goal   Start Date: 04/25/2020  Expected End Date: 10/25/2020  Recent Progress: On track  Priority: High  Note:   Current Barriers:  Unable to independently afford treatment regimen  Pharmacist Clinical Goal(s):  Patient will verbalize ability to afford treatment regimen maintain control of blood glucose and blood pressure as evidenced by home monitiring  contact provider office for questions/concerns as evidenced notation of same in electronic health record through collaboration with PharmD and provider.   Interventions: 1:1 collaboration with Susy Frizzle, MD regarding development and update of comprehensive plan of care as evidenced by provider attestation and co-signature Inter-disciplinary care team collaboration (see longitudinal plan of care) Comprehensive medication review performed; medication list updated in electronic medical record  Hypertension (BP goal <130/80) -Controlled -Current treatment: Metoprolol XL 43m daily -Medications previously tried: lisinopril (d/c) -Current home readings: no logs available  -Current exercise habits: minimal lately, patient having pain that limits physical activity, able to do ADLs with no concern -Reports hypotensive/hypertensive symptoms - dizziness, possible vertigo seems to be unrelated to BP -Educated on BP goals and benefits of medications for prevention of heart attack, stroke and kidney damage; Importance of home blood pressure monitoring; -Counseled to monitor BP at home once or twice weekly, document, and provide log at future appointments -Recommended to continue current medication  Hyperlipidemia: (LDL goal < 100) -Controlled -Current treatment: Atorvastatin 852m-Medications previously tried: none noted  -Educated on Cholesterol goals;  Benefits of statin for ASCVD risk reduction; Importance of limiting foods high in cholesterol;  -Reviewed most recent lipid panel -Recommended to continue  current medication  Diabetes (A1c goal <7%) -Controlled -Current medications: Pioglitazone 1513maily -Medications previously tried: metformin (GI issues)  -Current home glucose readings fasting glucose: 109-116 post prandial glucose: not available -Denies hypoglycemic/hyperglycemic symptoms -She denies any recent episodes of hypoglycemia -Educated on A1c and blood sugar goals; Prevention and management of hypoglycemic episodes; Benefits of routine self-monitoring of blood sugar; -Counseled to check feet daily and get yearly eye exams -Recommended to continue current medication Consider d/c of pioglitazone pending next A1c.  If A1c < 6.0 she may could trial off pioglitazone to manage hypoglycemia risk.  Update 08/01/20  She has had no lower end blood sugars since out last conversation.  Averages between 90-120.  Continues to take Actos daily.  Recommend she follow up in in about 4 months for follow up A1c with Dr. PicDennard Schaumann COPD (Goal: control symptoms and prevent exacerbations) -Controlled -Current treatment  Symbicort 160-4.5mc60mentolin HFA 90mc81mn -Medications previously tried: none noted  -Exacerbations requiring treatment in last 6 months: none -Patient denies consistent use of maintenance inhaler - still only using it once daily due to cost concern.  -Frequency of rescue inhaler use: daily prn -Counseled on Proper inhaler technique; Benefits of consistent maintenance inhaler use Differences between maintenance and rescue inhalers -Recommended to continue current medication Assessed patient finances. Will have Veronica reach out to her and help with process of applying for PAP for Symbicort.  Update 08/01/20 Now receiving Symbicort through patient assistance program.  She reports she is not taking it twice daily consistently.  Not having to use her Ventolin often at all. No changes recommended.  Will need to renew application in late 2022 3154next year.    Patient  Goals/Self-Care Activities Patient will:  - take medications as prescribed check glucose daily, document, and provide at future appointments check blood pressure a few times per week, document, and provide at future appointments collaborate with provider on medication access solutions.  Follow Up Plan: The care management team will reach out to the patient  again over the next 180 days.         Medication Assistance:  Symbicort obtained through PAP medication assistance program.  Enrollment ends 01/21/21  Patient's preferred pharmacy is:  [place], [place] [address] [place] [address] Phone: [phone] Fax: [phone]  [facility] ([facility] - [place], [place] [address] Phone: [phone] Fax: [phone]  [id number] [place], [place] - [id number] [id number]: [phone]: [phone] Uses pill box? Yes Pt endorses 100% compliance  We discussed: Benefits of medication synchronization, packaging and delivery as well as enhanced pharmacist oversight with [employer]. Patient decided to: Continue current medication management strategy  Care Plan and Follow Up Patient Decision:  Patient agrees to Care Plan and Follow-up.  Plan: The care management team will reach out to the patient again over the next 180 days.  CBeverly Milch PharmD Clinical Pharmacist [phone]

## 2020-08-01 ENCOUNTER — Other Ambulatory Visit: Payer: Self-pay

## 2020-08-01 ENCOUNTER — Ambulatory Visit (INDEPENDENT_AMBULATORY_CARE_PROVIDER_SITE_OTHER): Payer: Medicare HMO

## 2020-08-01 ENCOUNTER — Ambulatory Visit (INDEPENDENT_AMBULATORY_CARE_PROVIDER_SITE_OTHER): Payer: Medicare HMO | Admitting: Pharmacist

## 2020-08-01 ENCOUNTER — Ambulatory Visit: Payer: Medicare HMO | Admitting: Podiatry

## 2020-08-01 DIAGNOSIS — M19071 Primary osteoarthritis, right ankle and foot: Secondary | ICD-10-CM | POA: Diagnosis not present

## 2020-08-01 DIAGNOSIS — J439 Emphysema, unspecified: Secondary | ICD-10-CM | POA: Diagnosis not present

## 2020-08-01 DIAGNOSIS — M2142 Flat foot [pes planus] (acquired), left foot: Secondary | ICD-10-CM | POA: Diagnosis not present

## 2020-08-01 DIAGNOSIS — E119 Type 2 diabetes mellitus without complications: Secondary | ICD-10-CM | POA: Diagnosis not present

## 2020-08-01 DIAGNOSIS — M19072 Primary osteoarthritis, left ankle and foot: Secondary | ICD-10-CM | POA: Diagnosis not present

## 2020-08-01 DIAGNOSIS — M79671 Pain in right foot: Secondary | ICD-10-CM

## 2020-08-01 DIAGNOSIS — M2141 Flat foot [pes planus] (acquired), right foot: Secondary | ICD-10-CM | POA: Diagnosis not present

## 2020-08-01 MED ORDER — HYDROCODONE-ACETAMINOPHEN 5-325 MG PO TABS: 1.0000 | ORAL_TABLET | Freq: Four times a day (QID) | ORAL | 0 refills | Status: DC | PRN

## 2020-08-01 NOTE — Patient Instructions (Signed)
Visit Information   Goals Addressed             This Visit's Progress    Track and Manage My Symptoms-COPD   On track    Timeframe:  Long-Range Goal Priority:  High Start Date:  04/25/20                           Expected End Date:  10/25/20                     Follow Up Date 07/25/20   - eliminate symptom triggers at home - follow rescue plan if symptoms flare-up - keep follow-up appointments  -Await assistance from CCM team with patient assistance application   Why is this important?   Tracking your symptoms and other information about your health helps your doctor plan your care.  Write down the symptoms, the time of day, what you were doing and what medicine you are taking.  You will soon learn how to manage your symptoms.     Notes: Wants to take twice daily, cost is issue.        Patient Care Plan: General Pharmacy (Adult)     Problem Identified: HTN, HLD, COPD, DM   Priority: High  Onset Date: 04/25/2020     Goal: Patient-Specific Goal   Start Date: 04/25/2020  Expected End Date: 10/25/2020  Recent Progress: On track  Priority: High  Note:   Current Barriers:  Unable to independently afford treatment regimen  Pharmacist Clinical Goal(s):  Patient will verbalize ability to afford treatment regimen maintain control of blood glucose and blood pressure as evidenced by home monitiring  contact provider office for questions/concerns as evidenced notation of same in electronic health record through collaboration with PharmD and provider.   Interventions: 1:1 collaboration with Susy Frizzle, MD regarding development and update of comprehensive plan of care as evidenced by provider attestation and co-signature Inter-disciplinary care team collaboration (see longitudinal plan of care) Comprehensive medication review performed; medication list updated in electronic medical record  Hypertension (BP goal <130/80) -Controlled -Current treatment: Metoprolol XL  25mg  daily -Medications previously tried: lisinopril (d/c) -Current home readings: no logs available  -Current exercise habits: minimal lately, patient having pain that limits physical activity, able to do ADLs with no concern -Reports hypotensive/hypertensive symptoms - dizziness, possible vertigo seems to be unrelated to BP -Educated on BP goals and benefits of medications for prevention of heart attack, stroke and kidney damage; Importance of home blood pressure monitoring; -Counseled to monitor BP at home once or twice weekly, document, and provide log at future appointments -Recommended to continue current medication  Hyperlipidemia: (LDL goal < 100) -Controlled -Current treatment: Atorvastatin 80mg  -Medications previously tried: none noted  -Educated on Cholesterol goals;  Benefits of statin for ASCVD risk reduction; Importance of limiting foods high in cholesterol;  -Reviewed most recent lipid panel -Recommended to continue current medication  Diabetes (A1c goal <7%) -Controlled -Current medications: Pioglitazone 15mg  daily -Medications previously tried: metformin (GI issues)  -Current home glucose readings fasting glucose: 109-116 post prandial glucose: not available -Denies hypoglycemic/hyperglycemic symptoms -She denies any recent episodes of hypoglycemia -Educated on A1c and blood sugar goals; Prevention and management of hypoglycemic episodes; Benefits of routine self-monitoring of blood sugar; -Counseled to check feet daily and get yearly eye exams -Recommended to continue current medication Consider d/c of pioglitazone pending next A1c.  If A1c < 6.0 she may could trial off pioglitazone  to manage hypoglycemia risk.  Update 08/01/20  She has had no lower end blood sugars since out last conversation.  Averages between 90-120.  Continues to take Actos daily.  Recommend she follow up in in about 4 months for follow up A1c with Dr. Dennard Schaumann.   COPD (Goal: control  symptoms and prevent exacerbations) -Controlled -Current treatment  Symbicort 160-4.49mcg Ventolin HFA 34mcg prn -Medications previously tried: none noted  -Exacerbations requiring treatment in last 6 months: none -Patient denies consistent use of maintenance inhaler - still only using it once daily due to cost concern.  -Frequency of rescue inhaler use: daily prn -Counseled on Proper inhaler technique; Benefits of consistent maintenance inhaler use Differences between maintenance and rescue inhalers -Recommended to continue current medication Assessed patient finances. Will have Veronica reach out to her and help with process of applying for PAP for Symbicort.  Update 08/01/20 Now receiving Symbicort through patient assistance program.  She reports she is not taking it twice daily consistently.  Not having to use her Ventolin often at all. No changes recommended.  Will need to renew application in late 0211 for next year.    Patient Goals/Self-Care Activities Patient will:  - take medications as prescribed check glucose daily, document, and provide at future appointments check blood pressure a few times per week, document, and provide at future appointments collaborate with provider on medication access solutions.  Follow Up Plan: The care management team will reach out to the patient again over the next 180 days.        Patient verbalizes understanding of instructions provided today and agrees to view in [place].  Telephone follow up appointment with pharmacy team member scheduled for:6 months  Edythe Clarity, [place]

## 2020-08-02 ENCOUNTER — Ambulatory Visit: Payer: Medicare HMO | Admitting: Orthopaedic Surgery

## 2020-08-02 ENCOUNTER — Encounter: Payer: Self-pay | Admitting: Orthopaedic Surgery

## 2020-08-02 ENCOUNTER — Ambulatory Visit: Payer: Medicare HMO

## 2020-08-02 VITALS — BP 138/67 | HR 105 | Ht 66.0 in | Wt 218.0 lb

## 2020-08-02 DIAGNOSIS — G8929 Other chronic pain: Secondary | ICD-10-CM

## 2020-08-02 DIAGNOSIS — M170 Bilateral primary osteoarthritis of knee: Secondary | ICD-10-CM

## 2020-08-02 DIAGNOSIS — M25561 Pain in right knee: Secondary | ICD-10-CM

## 2020-08-02 MED ORDER — NAPROXEN 500 MG PO TABS: 500.0000 mg | ORAL_TABLET | Freq: Two times a day (BID) | ORAL | 5 refills | Status: DC

## 2020-08-02 NOTE — Progress Notes (Signed)
My knees hurt.  She has bilateral knee pain, more on the right.  She has swelling, popping and no giving way.  There is no trauma, no redness.  She has tried Advil with some relief.  She has no swelling of the feet.  Exam shows bilateral effusions and crepitus.  ROM right is 0 to 100, left 0 to 105.  Limp right.  Positive right medial [place], negative left.  NV intact.  No distal edema.  X-rays were done of both knees, reported separately.  Encounter Diagnoses  Name Primary?   Chronic pain of right knee Yes   Chronic pain of left knee    I have explained the X-rays to her and shown them to her.  I will begin Naprosyn 500 po bid pc.  PROCEDURE NOTE:  The patient requests injections of the left knee , verbal consent was obtained.  The left knee was prepped appropriately after time out was performed.   Sterile technique was observed and injection of 1 cc of Celestone 6 mg with several cc's of plain xylocaine. Anesthesia was provided by ethyl chloride and a 20-gauge needle was used to inject the knee area. The injection was tolerated well.  A band aid dressing was applied.  The patient was advised to apply ice later today and tomorrow to the injection sight as needed.   PROCEDURE NOTE:  The patient requests injections of the right knee , verbal consent was obtained.  The right knee was prepped appropriately after time out was performed.   Sterile technique was observed and injection of 1 cc of Celestone 6mg  with several cc's of plain xylocaine. Anesthesia was provided by ethyl chloride and a 20-gauge needle was used to inject the knee area. The injection was tolerated well.  A band aid dressing was applied.  The patient was advised to apply ice later today and tomorrow to the injection sight as needed.   She may need MRI.  Return in three weeks.  Call if any problem.  Precautions discussed.  Electronically Signed Sanjuana Kava, MD 7/12/20222:38 PM

## 2020-08-04 NOTE — Progress Notes (Signed)
Subjective:   Patient ID: [name], female   DOB: 75 y.o.   MRN: [id number]   HPI 75 year old female presents the office today with concerns of left ankle discomfort/pain.  She states that pain is on a consistent basis.  She previously underwent surgery in 2017 with myself for OCD repair, ankle arthroscopy on October 12, 2015.  She has been wearing shoes and good arch supports but she is continuing discomfort of the ankle.  She denies any recent injury or trauma.   Review of Systems  All other systems reviewed and are negative.  Past Medical History:  Diagnosis Date   Allergy    Anxiety    Arthritis    Asthma    Bursitis    Cataract    COPD (chronic obstructive pulmonary disease) (HCC)    Depression    Hiatal hernia    Hypercholesteremia    Hypertension    Type 2 diabetes mellitus ([place])    Ulcer     Past Surgical History:  Procedure Laterality Date   ABDOMINAL HYSTERECTOMY     ANKLE ARTHROSCOPY WITH DRILLING/MICROFRACTURE Left 10/12/2015   Procedure: LEFT ANKLE ARTHROSCOPY, REMOVAL OF OSTEOCHONDRAL LESION WITH MICROFRACTURE;  Surgeon: Trula Slade, DPM;  Location: [place];  Service: Podiatry;  Laterality: Left;   BIOPSY  10/24/2016   Procedure: BIOPSY;  Surgeon: Daneil Dolin, MD;  [facility];  Service: Endoscopy;;  gastric esophageal   CATARACT EXTRACTION [facility]  10/16/2010   Procedure: CATARACT EXTRACTION PHACO AND INTRAOCULAR LENS PLACEMENT ([place]);  Surgeon: Tonny Branch;  Location: [facility];  Service: Ophthalmology;  Laterality: Left;  CDE: 6.99   CATARACT EXTRACTION W/PHACO  12/18/2010   Procedure: CATARACT EXTRACTION PHACO AND INTRAOCULAR LENS PLACEMENT ([facility]);  Surgeon: Tonny Branch;  Location: [facility];  Service: Ophthalmology;  Laterality: Right;  CDE 12.50   COLONOSCOPY     about 5 years ago/[facility]   COLONOSCOPY WITH PROPOFOL N/A 10/24/2016   Procedure: COLONOSCOPY WITH PROPOFOL;  Surgeon: Daneil Dolin, MD;  [facility];  Service: Endoscopy;  Laterality: N/A;  215   ESOPHAGOGASTRODUODENOSCOPY  2003   Gastritis, path unavailable.   ESOPHAGOGASTRODUODENOSCOPY (EGD) WITH PROPOFOL N/A 10/24/2016   Procedure: ESOPHAGOGASTRODUODENOSCOPY (EGD) WITH PROPOFOL;  Surgeon: Daneil Dolin, MD;  [facility];  Service: Endoscopy;  Laterality: N/A;   EYE SURGERY     fracture lower back     left elbow tendon repair     NEUROPLASTY / TRANSPOSITION MEDIAN NERVE AT CARPAL TUNNEL BILATERAL     plantar fasc bilateral     right rotator cuff     rt foot reconstruction     trigger thumb     Left     Current Outpatient Medications:    HYDROcodone-acetaminophen (NORCO/VICODIN) 5-325 MG tablet, Take 1 tablet by mouth every 6 (six) hours as needed., Disp: 15 tablet, Rfl: 0   Accu-Chek Softclix Lancets lancets, Use as directed to monitor FSBS 2x daily. Dx: E11.9, Disp: 200 each, Rfl: 3   Alcohol Swabs 70 % PADS, USE 1 WIPE TO CLEANSE SITE- Use as directed to monitor FSBS 2x daily. Dx: E11.9, Disp: 100 each, Rfl: 1   atorvastatin (LIPITOR) 80 MG tablet, TAKE 1 TABLET EVERY DAY, Disp: 90 tablet, Rfl: 1   benzonatate (TESSALON) 100 MG capsule, TAKE 1 CAPSULE (100 MG TOTAL) BY MOUTH 2 (TWO) TIMES DAILY AS NEEDED FOR COUGH., Disp: 60 capsule, Rfl: 0   Blood Glucose Calibration (ACCU-CHEK  AVIVA) SOLN, USE FOR GLUCOMETER COLLABORATION, Disp: 1 each, Rfl: 0   Blood Glucose Monitoring Suppl (ACCU-CHEK AVIVA PLUS) w/Device KIT, Use as directed to monitor FSBS 2x daily. Dx: E11.9, Disp: 1 kit, Rfl: 1   budesonide-formoterol (SYMBICORT) 160-4.5 MCG/ACT inhaler, Inhale 2 puffs into the lungs 2 (two) times daily., Disp: 10.2 g, Rfl: 5   fluticasone (FLONASE) 50 MCG/ACT nasal spray, USE 2 SPRAYS IN EACH NOSTRIL EVERY DAY, Disp: 48 g, Rfl: 3   gabapentin (NEURONTIN) 300 MG capsule, Take 1 capsule (300 mg total) by mouth 3 (three) times daily., Disp: 90 capsule, Rfl: 3   glucose blood (ACCU-CHEK AVIVA PLUS) test strip, Use as  directed to monitor FSBS 2x daily. Dx: E11.9, Disp: 200 strip, Rfl: 3   HYDROcodone-acetaminophen (NORCO/VICODIN) 5-325 MG tablet, One tablet every six hours for pain.  Limit 7 days., Disp: 28 tablet, Rfl: 0   levocetirizine (XYZAL) 5 MG tablet, Take 1 tablet (5 mg total) by mouth every evening., Disp: 90 tablet, Rfl: 3   metoprolol succinate (TOPROL-XL) 25 MG 24 hr tablet, TAKE 1 TABLET EVERY DAY, Disp: 90 tablet, Rfl: 3   montelukast (SINGULAIR) 10 MG tablet, TAKE 1 TABLET (10 MG TOTAL) BY MOUTH AT BEDTIME., Disp: 90 tablet, Rfl: 3   naproxen (NAPROSYN) 500 MG tablet, Take 1 tablet (500 mg total) by mouth 2 (two) times daily with a meal., Disp: 60 tablet, Rfl: 5   pioglitazone (ACTOS) 15 MG tablet, TAKE 1 TABLET EVERY DAY, Disp: 90 tablet, Rfl: 1   Polyethyl Glycol-Propyl Glycol (SYSTANE OP), Apply 1 drop to eye 3 (three) times daily., Disp: , Rfl:    traZODone (DESYREL) 50 MG tablet, TAKE 3 TABLETS BY MOUTH AT BEDTIME AS NEEDED FOR SLEEP., Disp: 270 tablet, Rfl: 2   venlafaxine XR (EFFEXOR-XR) 75 MG 24 hr capsule, TAKE 2 CAPSULES EVERY DAY, Disp: 180 capsule, Rfl: 1   VENTOLIN HFA 108 (90 Base) MCG/ACT inhaler, INHALE 2 PUFFS EVERY 4 TO 6 HOURS AS NEEDED FOR WHEEZING AND SHORTNESS OF BREATH. BRAND NAME ONLY, Disp: 18 g, Rfl: 3  Allergies  Allergen Reactions   Codeine Itching   Flonase [Fluticasone Propionate] Cough    Severe coughing   Metformin And Related Diarrhea    Caused severe diarrhea and severe GI symptoms---even on low dose 54m BID          Objective:  Physical Exam  General: AAO x3, NAD  Dermatological: Skin is warm, dry and supple bilateral.  There are no open sores, no preulcerative lesions, no rash or signs of infection present.  Vascular: Dorsalis Pedis artery and Posterior Tibial artery pedal pulses are 2/4 bilateral with immedate capillary fill time. Pedal hair growth present. There is no pain with calf compression, swelling, warmth, erythema.   Neruologic:  Grossly intact via light touch bilateral.   Musculoskeletal: There is generalized tenderness to palpation on the left ankle.  There is tenderness with ankle joint range of motion as well as along the lateral sinus tarsi.  There is decreased medial arch upon weightbearing.  There is no area of pinpoint tenderness.  MMT 5/5.  Gait: Unassisted, Nonantalgic.       Assessment:   Left ankle arthritis     Plan:  -Treatment options discussed including all alternatives, risks, and complications -Etiology of symptoms were discussed -X-rays obtained reviewed.  Arthritic changes present at the ankle with valgus deformity present. -We discussed both conservative as well as surgical options.  At this point given her prolonged nature  of symptoms I do not think that orthotics and shoe modifications will be very beneficial.  Discussed custom bracing for the ankle if needed.  I would like for her to get another opinion in regards to the ankle and we will refer her to Dr. Nicki Reaper.  Trula Slade DPM

## 2020-08-05 ENCOUNTER — Telehealth: Payer: Self-pay | Admitting: *Deleted

## 2020-08-05 NOTE — Telephone Encounter (Signed)
-----   Message from Trula Slade, DPM sent at 08/04/2020  6:51 PM EDT ----- I put in a referral for Dr. Francee Gentile at [facility] orthopedics.  Can you please fax this over to his office?  Thank you.

## 2020-08-05 NOTE — Telephone Encounter (Signed)
Called and left a message for the patient stating that I sent a referral over to [facility] orthopedics Dr Francee Gentile and the phone number is [phone] and fax number is [phone]. [name]

## 2020-08-17 ENCOUNTER — Telehealth: Payer: Self-pay | Admitting: *Deleted

## 2020-08-17 NOTE — Telephone Encounter (Signed)
-----   Message from Trula Slade, DPM sent at 08/04/2020  6:51 PM EDT ----- I put in a referral for Dr. Francee Gentile at [facility] orthopedics.  Can you please fax this over to his office?  Thank you.

## 2020-08-17 NOTE — Telephone Encounter (Signed)
Error

## 2020-08-23 ENCOUNTER — Encounter: Payer: Self-pay | Admitting: Orthopaedic Surgery

## 2020-08-23 ENCOUNTER — Ambulatory Visit: Payer: Medicare HMO | Admitting: Orthopaedic Surgery

## 2020-08-24 ENCOUNTER — Telehealth: Payer: Self-pay | Admitting: *Deleted

## 2020-08-24 DIAGNOSIS — M25572 Pain in left ankle and joints of left foot: Secondary | ICD-10-CM | POA: Diagnosis not present

## 2020-08-24 DIAGNOSIS — M19072 Primary osteoarthritis, left ankle and foot: Secondary | ICD-10-CM | POA: Diagnosis not present

## 2020-08-24 NOTE — Telephone Encounter (Signed)
I tried to call the patient again today and it stated to try the call later. [name]

## 2020-09-07 ENCOUNTER — Other Ambulatory Visit: Payer: Self-pay | Admitting: Family Medicine

## 2020-10-05 ENCOUNTER — Other Ambulatory Visit: Payer: Self-pay | Admitting: *Deleted

## 2020-10-05 MED ORDER — BUDESONIDE-FORMOTEROL FUMARATE 160-4.5 MCG/ACT IN AERO: 2.0000 | INHALATION_SPRAY | Freq: Two times a day (BID) | RESPIRATORY_TRACT | 3 refills | Status: DC

## 2020-10-10 ENCOUNTER — Telehealth: Payer: Self-pay | Admitting: Pharmacist

## 2020-10-10 NOTE — Progress Notes (Signed)
Chronic Care Management Pharmacy Assistant   Name: [name]  MRN: [id number] DOB: 05/02/1945  Reason for Encounter: Disease State For DM.    Conditions to be addressed/monitored: HTN, COPD, GERD, Diabetes, CKD, Depression/Anxiety, hypercholesterolemia.  Recent office visits:  None since 08/01/20  Recent consult visits:  08/24/20 Orthopedic Surgery Case, Martinique Miller, MD For initial consult/Osteoarthritis.  08/02/20 Sanjuana Kava, MD. For knee pain. STARTED Naproxen 500 mg 2 times daily. 08/01/20 Podiatry Trula Slade, DPM. For foot pain. No medication changes.   Hospital visits:  None since 08/01/20  Medications: Outpatient Encounter Medications as of 10/10/2020  Medication Sig   Accu-Chek Softclix Lancets lancets Use as directed to monitor FSBS 2x daily. Dx: E11.9   Alcohol Swabs 70 % PADS USE 1 WIPE TO CLEANSE SITE- Use as directed to monitor FSBS 2x daily. Dx: E11.9   atorvastatin (LIPITOR) 80 MG tablet TAKE 1 TABLET EVERY DAY   benzonatate (TESSALON) 100 MG capsule TAKE 1 CAPSULE (100 MG TOTAL) BY MOUTH 2 (TWO) TIMES DAILY AS NEEDED FOR COUGH.   Blood Glucose Calibration (ACCU-CHEK AVIVA) SOLN USE FOR GLUCOMETER COLLABORATION   Blood Glucose Monitoring Suppl (ACCU-CHEK AVIVA PLUS) w/Device KIT Use as directed to monitor FSBS 2x daily. Dx: E11.9   budesonide-formoterol (SYMBICORT) 160-4.5 MCG/ACT inhaler Inhale 2 puffs into the lungs 2 (two) times daily.   fluticasone (FLONASE) 50 MCG/ACT nasal spray USE 2 SPRAYS IN EACH NOSTRIL EVERY DAY   gabapentin (NEURONTIN) 300 MG capsule Take 1 capsule (300 mg total) by mouth 3 (three) times daily.   glucose blood (ACCU-CHEK AVIVA PLUS) test strip Use as directed to monitor FSBS 2x daily. Dx: E11.9   HYDROcodone-acetaminophen (NORCO/VICODIN) 5-325 MG tablet One tablet every six hours for pain.  Limit 7 days.   HYDROcodone-acetaminophen (NORCO/VICODIN) 5-325 MG tablet Take 1 tablet by mouth every 6 (six) hours as needed.    levocetirizine (XYZAL) 5 MG tablet Take 1 tablet (5 mg total) by mouth every evening.   metoprolol succinate (TOPROL-XL) 25 MG 24 hr tablet TAKE 1 TABLET EVERY DAY   montelukast (SINGULAIR) 10 MG tablet TAKE 1 TABLET (10 MG TOTAL) BY MOUTH AT BEDTIME.   naproxen (NAPROSYN) 500 MG tablet Take 1 tablet (500 mg total) by mouth 2 (two) times daily with a meal.   pioglitazone (ACTOS) 15 MG tablet TAKE 1 TABLET EVERY DAY   Polyethyl Glycol-Propyl Glycol (SYSTANE OP) Apply 1 drop to eye 3 (three) times daily.   traZODone (DESYREL) 50 MG tablet TAKE 3 TABLETS BY MOUTH AT BEDTIME AS NEEDED FOR SLEEP.   venlafaxine XR (EFFEXOR-XR) 75 MG 24 hr capsule TAKE 2 CAPSULES EVERY DAY   VENTOLIN HFA 108 (90 Base) MCG/ACT inhaler INHALE 2 PUFFS EVERY 4 TO 6 HOURS AS NEEDED FOR WHEEZING AND SHORTNESS OF BREATH. BRAND NAME ONLY   No facility-administered encounter medications on file as of 10/10/2020.   Recent Relevant Labs: Lab Results  Component Value Date/Time   HGBA1C 6.0 (H) 04/18/2020 02:57 PM   HGBA1C 6.0 (H) 09/07/2019 02:58 PM   HGBA1C 6.1 (H) 12/07/2015 11:51 AM   MICROALBUR 1.1 09/07/2019 02:58 PM   MICROALBUR 0.5 01/24/2017 12:28 PM    Kidney Function Lab Results  Component Value Date/Time   CREATININE 0.95 (H) 04/18/2020 02:57 PM   CREATININE 1.24 (H) 09/07/2019 02:58 PM   GFRNONAA 60 04/18/2020 02:57 PM   GFRAA 69 04/18/2020 02:57 PM    Current antihyperglycemic regimen:  Pioglitazone 36m daily  What recent interventions/DTPs have  been made to improve glycemic control:  None.   Have there been any recent hospitalizations or ED visits since last visit with CPP? Patient stated no.   Patient denies hypoglycemic symptoms, including None  Patient denies hyperglycemic symptoms, including none  How often are you checking your blood sugar? Patient stated once daily  What are your blood sugars ranging?  107-10/11/20  During the week, how often does your blood glucose drop below 70?  Patient stated Never.  Are you checking your feet daily/regularly? Patient stated she checks her feet regularly. she recently spoke with her foot her doctor last month.   Adherence Review: Is the patient currently on a STATIN medication? Atorvastatin 80 mg   Is the patient currently on ACE/ARB medication? N/A.  Does the patient have >5 day gap between last estimated fill dates? Per misc rpts, yes.   Care Gaps:Patient is over due for her AWV. Patient is due for eye and foot exam.  Star Rating Drugs:Atorvastatin 80 mg 06/25/20 90 DS, Pioglitazone 15 mg 5/929/22 DS.   Follow-Up:Pharmacist Review  [name], [place] Pharmacist Assistant [phone]

## 2020-10-12 ENCOUNTER — Telehealth: Payer: Self-pay | Admitting: *Deleted

## 2020-10-12 NOTE — Telephone Encounter (Signed)
Call placed to patient.   Reports that she has intermittent pain for months, but has had increase in pain.   States that pain is localized in buttocks and back of legs.   Advised that OV will be needed for evaluation. Appointment scheduled.

## 2020-10-12 NOTE — Telephone Encounter (Signed)
-----   Message from [place] sent at 10/12/2020  2:43 PM EDT ----- Regarding: Questions about fibromyalgia Patient stated she has been having increased pain in her leg up to her buttocks and she thinks it may be the cause of her fibromyalgia. Patient stated she would like call back in regards of this. Thanks.

## 2020-10-17 ENCOUNTER — Ambulatory Visit: Payer: Medicare HMO | Admitting: Family Medicine

## 2020-10-20 ENCOUNTER — Other Ambulatory Visit: Payer: Self-pay

## 2020-10-20 ENCOUNTER — Ambulatory Visit (INDEPENDENT_AMBULATORY_CARE_PROVIDER_SITE_OTHER): Payer: Medicare HMO

## 2020-10-20 VITALS — Ht 65.0 in | Wt 215.0 lb

## 2020-10-20 DIAGNOSIS — Z780 Asymptomatic menopausal state: Secondary | ICD-10-CM

## 2020-10-20 DIAGNOSIS — Z0000 Encounter for general adult medical examination without abnormal findings: Secondary | ICD-10-CM | POA: Diagnosis not present

## 2020-10-20 NOTE — Patient Instructions (Signed)
[name] , Thank you for taking time to come for your Medicare Wellness Visit. I appreciate your ongoing commitment to your health goals. Please review the following plan we discussed and let me know if I can assist you in the future.   Screening recommendations/referrals: Colonoscopy: 11/03/2016 Repeat in 5 years  Mammogram: Done 06/24/2020 Repeat annually  Bone Density: Done 09/12/15 Repeat every 2 years Order placed today. Recommended yearly ophthalmology/optometry visit for glaucoma screening and checkup Recommended yearly dental visit for hygiene and checkup  Vaccinations: Influenza vaccine: Done 12/11/2018 Repeat annually  Pneumococcal vaccine: Done 01/23/2004 and 01/12/2015 Tdap vaccine: Done 11/30/2008 Repeat in 10 years  Shingles vaccine: Shingrix discussed. Please contact your pharmacy for coverage information.     Covid-19:Done 2021 x 1 dose.  Advanced directives: Advance directive discussed with you today. Even though you declined this today, please call our office should you change your mind, and we can give you the proper paperwork for you to fill out.   Conditions/risks identified: Aim for 30 minutes of exercise or walking each day, drink 6-8 glasses of water and eat lots of fruits and vegetables.   Next appointment: Follow up in one year for your annual wellness visit 2023   Preventive Care 65 Years and Older, Female Preventive care refers to lifestyle choices and visits with your health care provider that can promote health and wellness. What does preventive care include? A yearly physical exam. This is also called an annual well check. Dental exams once or twice a year. Routine eye exams. Ask your health care provider how often you should have your eyes checked. Personal lifestyle choices, including: Daily care of your teeth and gums. Regular physical activity. Eating a healthy diet. Avoiding tobacco and drug use. Limiting alcohol use. Practicing safe  sex. Taking low-dose aspirin every day. Taking vitamin and mineral supplements as recommended by your health care provider. What happens during an annual well check? The services and screenings done by your health care provider during your annual well check will depend on your age, overall health, lifestyle risk factors, and family history of disease. Counseling  Your health care provider may ask you questions about your: Alcohol use. Tobacco use. Drug use. Emotional well-being. Home and relationship well-being. Sexual activity. Eating habits. History of falls. Memory and ability to understand (cognition). Work and work Statistician. Reproductive health. Screening  You may have the following tests or measurements: Height, weight, and BMI. Blood pressure. Lipid and cholesterol levels. These may be checked every 5 years, or more frequently if you are over 11 years old. Skin check. Lung cancer screening. You may have this screening every year starting at age 48 if you have a 30-pack-year history of smoking and currently smoke or have quit within the past 15 years. Fecal occult blood test (FOBT) of the stool. You may have this test every year starting at age 10. Flexible sigmoidoscopy or colonoscopy. You may have a sigmoidoscopy every 5 years or a colonoscopy every 10 years starting at age 64. Hepatitis C blood test. Hepatitis B blood test. Sexually transmitted disease (STD) testing. Diabetes screening. This is done by checking your blood sugar (glucose) after you have not eaten for a while (fasting). You may have this done every 1-3 years. Bone density scan. This is done to screen for osteoporosis. You may have this done starting at age 25. Mammogram. This may be done every 1-2 years. Talk to your health care provider about how often you should have regular mammograms.  Talk with your health care provider about your test results, treatment options, and if necessary, the need for more  tests. Vaccines  Your health care provider may recommend certain vaccines, such as: Influenza vaccine. This is recommended every year. Tetanus, diphtheria, and acellular pertussis (Tdap, Td) vaccine. You may need a Td booster every 10 years. Zoster vaccine. You may need this after age 15. Pneumococcal 13-valent conjugate (PCV13) vaccine. One dose is recommended after age 86. Pneumococcal polysaccharide (PPSV23) vaccine. One dose is recommended after age 30. Talk to your health care provider about which screenings and vaccines you need and how often you need them. This information is not intended to replace advice given to you by your health care provider. Make sure you discuss any questions you have with your health care provider. Document Released: 02/04/2015 Document Revised: 09/28/2015 Document Reviewed: 11/09/2014 Elsevier Interactive Patient Education  2017 [place] Prevention in the Home Falls can cause injuries. They can happen to people of all ages. There are many things you can do to make your home safe and to help prevent falls. What can I do on the outside of my home? Regularly fix the edges of walkways and driveways and fix any cracks. Remove anything that might make you trip as you walk through a door, such as a raised step or threshold. Trim any bushes or trees on the path to your home. Use bright outdoor lighting. Clear any walking paths of anything that might make someone trip, such as rocks or tools. Regularly check to see if handrails are loose or broken. Make sure that both sides of any steps have handrails. Any raised decks and porches should have guardrails on the edges. Have any leaves, snow, or ice cleared regularly. Use sand or salt on walking paths during winter. Clean up any spills in your garage right away. This includes oil or grease spills. What can I do in the bathroom? Use night lights. Install grab bars by the toilet and in the tub and shower.  Do not use towel bars as grab bars. Use non-skid mats or decals in the tub or shower. If you need to sit down in the shower, use a plastic, non-slip stool. Keep the floor dry. Clean up any water that spills on the floor as soon as it happens. Remove soap buildup in the tub or shower regularly. Attach bath mats securely with double-sided non-slip rug tape. Do not have throw rugs and other things on the floor that can make you trip. What can I do in the bedroom? Use night lights. Make sure that you have a light by your bed that is easy to reach. Do not use any sheets or blankets that are too big for your bed. They should not hang down onto the floor. Have a firm chair that has side arms. You can use this for support while you get dressed. Do not have throw rugs and other things on the floor that can make you trip. What can I do in the kitchen? Clean up any spills right away. Avoid walking on wet floors. Keep items that you use a lot in easy-to-reach places. If you need to reach something above you, use a strong step stool that has a grab bar. Keep electrical cords out of the way. Do not use floor polish or wax that makes floors slippery. If you must use wax, use non-skid floor wax. Do not have throw rugs and other things on the floor that can make  you trip. What can I do with my stairs? Do not leave any items on the stairs. Make sure that there are handrails on both sides of the stairs and use them. Fix handrails that are broken or loose. Make sure that handrails are as long as the stairways. Check any carpeting to make sure that it is firmly attached to the stairs. Fix any carpet that is loose or worn. Avoid having throw rugs at the top or bottom of the stairs. If you do have throw rugs, attach them to the floor with carpet tape. Make sure that you have a light switch at the top of the stairs and the bottom of the stairs. If you do not have them, ask someone to add them for you. What else  can I do to help prevent falls? Wear shoes that: Do not have high heels. Have rubber bottoms. Are comfortable and fit you well. Are closed at the toe. Do not wear sandals. If you use a stepladder: Make sure that it is fully opened. Do not climb a closed stepladder. Make sure that both sides of the stepladder are locked into place. Ask someone to hold it for you, if possible. Clearly mark and make sure that you can see: Any grab bars or handrails. First and last steps. Where the edge of each step is. Use tools that help you move around (mobility aids) if they are needed. These include: Canes. Walkers. Scooters. Crutches. Turn on the lights when you go into a dark area. Replace any light bulbs as soon as they burn out. Set up your furniture so you have a clear path. Avoid moving your furniture around. If any of your floors are uneven, fix them. If there are any pets around you, be aware of where they are. Review your medicines with your doctor. Some medicines can make you feel dizzy. This can increase your chance of falling. Ask your doctor what other things that you can do to help prevent falls. This information is not intended to replace advice given to you by your health care provider. Make sure you discuss any questions you have with your health care provider. Document Released: 11/04/2008 Document Revised: 06/16/2015 Document Reviewed: 02/12/2014 Elsevier Interactive Patient Education  2017 [employer].

## 2020-10-20 NOTE — Progress Notes (Signed)
Subjective:   [name] is a 75 y.o. female who presents for an Initial Medicare Annual Wellness Visit. Virtual Visit via Telephone Note  I connected with  [name] on 10/20/20 at 12:00 PM EDT by telephone and verified that I am speaking with the correct person using two identifiers.  Location: Patient: HOME Provider: [facility] Persons participating in the virtual visit: patient/Nurse Health Advisor   I discussed the limitations, risks, security and privacy concerns of performing an evaluation and management service by telephone and the availability of in person appointments. The patient expressed understanding and agreed to proceed.  Interactive audio and video telecommunications were attempted between this nurse and patient, however failed, due to patient having technical difficulties OR patient did not have access to video capability.  We continued and completed visit with audio only.  Some vital signs may be absent or patient reported.   [name], LPN  Review of Systems     Cardiac Risk Factors include: advanced age (>21mn, >>53women);diabetes mellitus;hypertension;dyslipidemia;obesity (BMI >30kg/m2);sedentary lifestyle     Objective:    Today's Vitals   10/20/20 1153  Weight: 215 lb (97.5 kg)  Height: '5\' 5"'  (1.651 m)   Body mass index is 35.78 kg/m.  Advanced Directives 10/20/2020 10/24/2016 10/18/2016 10/12/2015 10/06/2015 12/18/2010 12/13/2010  Does Patient Have a Medical Advance Directive? No No No No No Patient does not have advance directive;Patient would not like information Patient does not have advance directive;Patient would not like information  Would patient like information on creating a medical advance directive? No - Patient declined - Yes (MAU/Ambulatory/Procedural Areas - Information given) No - patient declined information - - -  Pre-existing out of facility DNR order (yellow form or pink MOST form) - - - - - No No    Current  Medications (verified) Outpatient Encounter Medications as of 10/20/2020  Medication Sig   Accu-Chek Softclix Lancets lancets Use as directed to monitor FSBS 2x daily. Dx: E11.9   Alcohol Swabs 70 % PADS USE 1 WIPE TO CLEANSE SITE- Use as directed to monitor FSBS 2x daily. Dx: E11.9   atorvastatin (LIPITOR) 80 MG tablet TAKE 1 TABLET EVERY DAY   Blood Glucose Calibration (ACCU-CHEK AVIVA) SOLN USE FOR GLUCOMETER COLLABORATION   Blood Glucose Monitoring Suppl (ACCU-CHEK AVIVA PLUS) w/Device KIT Use as directed to monitor FSBS 2x daily. Dx: E11.9   budesonide-formoterol (SYMBICORT) 160-4.5 MCG/ACT inhaler Inhale 2 puffs into the lungs 2 (two) times daily.   gabapentin (NEURONTIN) 300 MG capsule Take 1 capsule (300 mg total) by mouth 3 (three) times daily.   glucose blood (ACCU-CHEK AVIVA PLUS) test strip Use as directed to monitor FSBS 2x daily. Dx: E11.9   levocetirizine (XYZAL) 5 MG tablet Take 1 tablet (5 mg total) by mouth every evening.   metoprolol succinate (TOPROL-XL) 25 MG 24 hr tablet TAKE 1 TABLET EVERY DAY   montelukast (SINGULAIR) 10 MG tablet TAKE 1 TABLET (10 MG TOTAL) BY MOUTH AT BEDTIME.   pioglitazone (ACTOS) 15 MG tablet TAKE 1 TABLET EVERY DAY   Polyethyl Glycol-Propyl Glycol (SYSTANE OP) Apply 1 drop to eye 3 (three) times daily.   traZODone (DESYREL) 50 MG tablet TAKE 3 TABLETS BY MOUTH AT BEDTIME AS NEEDED FOR SLEEP.   venlafaxine XR (EFFEXOR-XR) 75 MG 24 hr capsule TAKE 2 CAPSULES EVERY DAY   VENTOLIN HFA 108 (90 Base) MCG/ACT inhaler INHALE 2 PUFFS EVERY 4 TO 6 HOURS AS NEEDED FOR WHEEZING AND SHORTNESS OF BREATH. BRAND  NAME ONLY   benzonatate (TESSALON) 100 MG capsule TAKE 1 CAPSULE (100 MG TOTAL) BY MOUTH 2 (TWO) TIMES DAILY AS NEEDED FOR COUGH. (Patient not taking: Reported on 10/20/2020)   fluticasone (FLONASE) 50 MCG/ACT nasal spray USE 2 SPRAYS IN EACH NOSTRIL EVERY DAY (Patient not taking: Reported on 10/20/2020)   HYDROcodone-acetaminophen (NORCO/VICODIN) 5-325 MG  tablet One tablet every six hours for pain.  Limit 7 days. (Patient not taking: Reported on 10/20/2020)   HYDROcodone-acetaminophen (NORCO/VICODIN) 5-325 MG tablet Take 1 tablet by mouth every 6 (six) hours as needed. (Patient not taking: Reported on 10/20/2020)   naproxen (NAPROSYN) 500 MG tablet Take 1 tablet (500 mg total) by mouth 2 (two) times daily with a meal. (Patient not taking: Reported on 10/20/2020)   No facility-administered encounter medications on file as of 10/20/2020.    Allergies (verified) Codeine, Flonase [fluticasone propionate], and Metformin and related   History: Past Medical History:  Diagnosis Date   Allergy    Anxiety    Arthritis    Asthma    Bursitis    Cataract    COPD (chronic obstructive pulmonary disease) (HCC)    Depression    Hiatal hernia    Hypercholesteremia    Hypertension    Type 2 diabetes mellitus ([place])    Ulcer    Past Surgical History:  Procedure Laterality Date   ABDOMINAL HYSTERECTOMY     ANKLE ARTHROSCOPY WITH DRILLING/MICROFRACTURE Left 10/12/2015   Procedure: LEFT ANKLE ARTHROSCOPY, REMOVAL OF OSTEOCHONDRAL LESION WITH MICROFRACTURE;  Surgeon: Trula Slade, DPM;  Location: [facility];  Service: Podiatry;  Laterality: Left;   BIOPSY  10/24/2016   Procedure: BIOPSY;  Surgeon: Daneil Dolin, MD;  [facility];  Service: Endoscopy;;  gastric esophageal   CATARACT EXTRACTION [facility]  10/16/2010   Procedure: CATARACT EXTRACTION PHACO AND INTRAOCULAR LENS PLACEMENT ([place]);  Surgeon: Tonny Branch;  Location: [facility];  Service: Ophthalmology;  Laterality: Left;  CDE: 6.99   CATARACT EXTRACTION W/PHACO  12/18/2010   Procedure: CATARACT EXTRACTION PHACO AND INTRAOCULAR LENS PLACEMENT ([facility]);  Surgeon: Tonny Branch;  Location: [facility];  Service: Ophthalmology;  Laterality: Right;  CDE 12.50   COLONOSCOPY     about 5 years ago/[facility]   COLONOSCOPY WITH PROPOFOL N/A 10/24/2016   Procedure: COLONOSCOPY WITH PROPOFOL;  Surgeon:  Daneil Dolin, MD;  [facility];  Service: Endoscopy;  Laterality: N/A;  215   ESOPHAGOGASTRODUODENOSCOPY  2003   Gastritis, path unavailable.   ESOPHAGOGASTRODUODENOSCOPY (EGD) WITH PROPOFOL N/A 10/24/2016   Procedure: ESOPHAGOGASTRODUODENOSCOPY (EGD) WITH PROPOFOL;  Surgeon: Daneil Dolin, MD;  [facility];  Service: Endoscopy;  Laterality: N/A;   EYE SURGERY     fracture lower back     left elbow tendon repair     NEUROPLASTY / TRANSPOSITION MEDIAN NERVE AT CARPAL TUNNEL BILATERAL     plantar fasc bilateral     right rotator cuff     rt foot reconstruction     trigger thumb     Left   Family History  Problem Relation Age of Onset   Breast cancer Daughter    Heart disease Mother    Hyperlipidemia Mother    Hyperlipidemia Father    Cancer Sister    Birth defects Grandchild    Anesthesia problems Neg Hx    Hypotension Neg Hx    Malignant hyperthermia Neg Hx    Pseudochol deficiency Neg Hx    Colon cancer Neg Hx  Social History   Socioeconomic History   Marital status: Single    Spouse name: Not on file   Number of children: 2   Years of education: Not on file   Highest education level: Not on file  Occupational History   Not on file  Tobacco Use   Smoking status: Some Days    Packs/day: 0.50    Years: 52.00    Pack years: 26.00    Types: Cigarettes   Smokeless tobacco: Never   Tobacco comments:    10/20/20-declines low dose CT scan  Vaping Use   Vaping Use: Never used  Substance and Sexual Activity   Alcohol use: Yes    Comment: rare   Drug use: Yes    Frequency: 3.0 times per week    Types: Marijuana    Comment: not in a few months    Sexual activity: Not Currently    Birth control/protection: Surgical  Other Topics Concern   Not on file  Social History Narrative   2 sons, 1 son lives with patient.   2 boys 3 girls grandchildren, 3 great grandchildren.   Social Determinants of Health   Financial Resource Strain: Low Risk     Difficulty of Paying Living Expenses: Not hard at all  Food Insecurity: No Food Insecurity   Worried About Charity fundraiser in the Last Year: Never true   [place] in the Last Year: Never true  Transportation Needs: No Transportation Needs   Lack of Transportation (Medical): No   Lack of Transportation (Non-Medical): No  Physical Activity: Insufficiently Active   Days of Exercise per Week: 3 days   Minutes of Exercise per Session: 30 min  Stress: No Stress Concern Present   Feeling of Stress : Not at all  Social Connections: Socially Isolated   Frequency of Communication with Friends and Family: More than three times a week   Frequency of Social Gatherings with Friends and Family: More than three times a week   Attends Religious Services: Never   Marine scientist or Organizations: No   Attends Music therapist: Never   Marital Status: Divorced    Tobacco Counseling Ready to quit: Not Answered Counseling given: Not Answered Tobacco comments: 10/20/20-declines low dose CT scan   Clinical Intake:  Pre-visit preparation completed: Yes  Pain : No/denies pain     BMI - recorded: 35.78 Nutritional Status: BMI > 30  Obese Nutritional Risks: Unintentional weight gain Diabetes: Yes  How often do you need to have someone help you when you read instructions, pamphlets, or other written materials from your doctor or pharmacy?: 1 - Never  Diabetic?Nutrition Risk Assessment:  Has the patient had any N/V/D within the last 2 months?  No  Does the patient have any non-healing wounds?  No  Has the patient had any unintentional weight loss or weight gain?  Yes   Diabetes:  Is the patient diabetic?  Yes  If diabetic, was a CBG obtained today?  No  Did the patient bring in their glucometer from home?  No  Phone visit How often do you monitor your CBG's? Daily, 107 this am.   Financial Strains and Diabetes Management:  Are you having any financial  strains with the device, your supplies or your medication? No .  Does the patient want to be seen by Chronic Care Management for management of their diabetes?  No  Would the patient like to be referred to a Nutritionist  or for Diabetic Management?  No   Diabetic Exams:  Diabetic Eye Exam: Completed 2021. Overdue for diabetic eye exam. Pt has been advised about the importance in completing this exam. Pt states she will call this week to schedule.  Diabetic Foot Exam: Completed Not done. Pt has been advised about the importance in completing this exam.   Interpreter Needed?: No  Information entered by :: MJ Hilbert Briggs, LPN   Activities of Daily Living In your present state of health, do you have any difficulty performing the following activities: 10/20/2020  Hearing? N  Vision? N  Walking or climbing stairs? N  Dressing or bathing? N  Doing errands, shopping? N  Preparing Food and eating ? N  Using the Toilet? N  In the past six months, have you accidently leaked urine? Y  Comment stress incontinence. appt made to discuss with Dr. Dennard Schaumann  Do you have problems with loss of bowel control? N  Managing your Medications? N  Managing your Finances? N  Housekeeping or managing your Housekeeping? N  Some recent data might be hidden    Patient Care Team: Susy Frizzle, MD as PCP - General (Family Medicine) Gala Romney Cristopher Estimable, MD as Consulting Physician (Gastroenterology) Edythe Clarity, [facility] as Pharmacist (Pharmacist)  Indicate any recent Medical Services you may have received from other than [facility] providers in the past year (date may be approximate).     Assessment:   This is a routine wellness examination for [name].  Hearing/Vision screen Hearing Screening - Comments:: No hearing issues.  Vision Screening - Comments:: Readers. Overdue for eye exam. [facility].  Dietary issues and exercise activities discussed: Current Exercise Habits: Home exercise routine, Type of  exercise: walking, Time (Minutes): 30, Frequency (Times/Week): 3, Weekly Exercise (Minutes/Week): 90, Intensity: Mild, Exercise limited by: orthopedic condition(s);cardiac condition(s);respiratory conditions(s)   Goals Addressed             This Visit's Progress    Exercise 3x per week (30 min per time)       Increase exercise.        Depression Screen PHQ 2/9 Scores 10/20/2020 04/18/2020 01/24/2017 03/23/2016 01/12/2015  PHQ - 2 Score 0 '4 3 3 1  ' PHQ- 9 Score - '17 17 6 ' -    Fall Risk Fall Risk  10/20/2020 04/18/2020 01/24/2017 03/23/2016 01/12/2015  Falls in the past year? 0 0 No No No  Number falls in past yr: 0 - - - -  Injury with Fall? 0 - - - -  Risk for fall due to : Impaired vision;Impaired balance/gait No Fall Risks - - -  Follow up Falls evaluation completed Falls evaluation completed - - -    FALL RISK PREVENTION PERTAINING TO THE HOME:  Any stairs in or around the home? No  If so, are there any without handrails? No  Home free of loose throw rugs in walkways, pet beds, electrical cords, etc? Yes  Adequate lighting in your home to reduce risk of falls? Yes   ASSISTIVE DEVICES UTILIZED TO PREVENT FALLS:  Life alert? Yes  Use of a cane, walker or w/c? No  Grab bars in the bathroom? Yes  Shower chair or bench in shower? Yes  Elevated toilet seat or a handicapped toilet? No   TIMED UP AND GO:  Was the test performed? No . Phone visit.  Cognitive Function:     6CIT Screen 10/20/2020  What Year? 0 points  What month? 0 points  What time? 0 points  Count back from 20 0 points  Months in reverse 0 points  Repeat phrase 0 points  Total Score 0    Immunizations Immunization History  Administered Date(s) Administered   Fluad Quad(high Dose 65+) 12/11/2018   Influenza, High Dose Seasonal PF 12/24/2016, 12/09/2017   Influenza,inj,Quad PF,6+ Mos 01/12/2015, 12/07/2015   Influenza-Unspecified 02/27/2013   Pneumococcal Conjugate-13 01/23/2004, 01/12/2015   Tdap  11/30/2008    TDAP status: Due, Education has been provided regarding the importance of this vaccine. Advised may receive this vaccine at local pharmacy or Health Dept. Aware to provide a copy of the vaccination record if obtained from local pharmacy or Health Dept. Verbalized acceptance and understanding.  Flu Vaccine status: Due, Education has been provided regarding the importance of this vaccine. Advised may receive this vaccine at local pharmacy or Health Dept. Aware to provide a copy of the vaccination record if obtained from local pharmacy or Health Dept. Verbalized acceptance and understanding.  Pneumococcal vaccine status: Up to date  Covid-19 vaccine status: Information provided on how to obtain vaccines.   Qualifies for Shingles Vaccine? Yes   Zostavax completed No   Shingrix Completed?: No.    Education has been provided regarding the importance of this vaccine. Patient has been advised to call insurance company to determine out of pocket expense if they have not yet received this vaccine. Advised may also receive vaccine at local pharmacy or Health Dept. Verbalized acceptance and understanding.  Screening Tests Health Maintenance  Topic Date Due   COVID-19 Vaccine (1) Never done   Zoster Vaccines- Shingrix (1 of 2) Never done   TETANUS/TDAP  12/01/2018   INFLUENZA VACCINE  08/22/2020   FOOT EXAM  09/06/2020   URINE MICROALBUMIN  09/06/2020   OPHTHALMOLOGY EXAM  09/20/2020   HEMOGLOBIN A1C  10/19/2020   COLONOSCOPY (Pts 45-55yr Insurance coverage will need to be confirmed)  10/24/2021   MAMMOGRAM  06/25/2022   DEXA SCAN  Completed   Hepatitis C Screening  Completed   HPV VACCINES  Aged Out    Health Maintenance  Health Maintenance Due  Topic Date Due   COVID-19 Vaccine (1) Never done   Zoster Vaccines- Shingrix (1 of 2) Never done   TETANUS/TDAP  12/01/2018   INFLUENZA VACCINE  08/22/2020   FOOT EXAM  09/06/2020   URINE MICROALBUMIN  09/06/2020   OPHTHALMOLOGY  EXAM  09/20/2020   HEMOGLOBIN A1C  10/19/2020    Colorectal cancer screening: Type of screening: Colonoscopy. Completed 11/03/16. Repeat every 5 years  Mammogram status: Completed 06/24/20. Repeat every year  Bone Density status: Ordered 10/20/20. Pt provided with contact info and advised to call to schedule appt.  Lung Cancer Screening: (Low Dose CT Chest recommended if Age 73-80years, 30 pack-year currently smoking OR have quit w/in 15years.) does qualify.   Lung Cancer Screening Referral: Pt declined at this time.   Additional Screening:  Hepatitis C Screening: does qualify; Completed 02/28/2015  Vision Screening: Recommended annual ophthalmology exams for early detection of glaucoma and other disorders of the eye. Is the patient up to date with their annual eye exam?  No  Who is the provider or what is the name of the office in which the patient attends annual eye exams? [facility] . Pt states she will call to schedule. If pt is not established with a provider, would they like to be referred to a provider to establish care? No .   Dental Screening: Recommended annual dental exams for proper oral hygiene  Community Resource Referral / Chronic Care Management: CRR required this visit?  No   CCM required this visit?  No      Plan:     I have personally reviewed and noted the following in the patient's chart:   Medical and social history Use of alcohol, tobacco or illicit drugs  Current medications and supplements including opioid prescriptions. Patient is not currently taking opioid prescriptions. Functional ability and status Nutritional status Physical activity Advanced directives List of other physicians Hospitalizations, surgeries, and ER visits in previous 12 months Vitals Screenings to include cognitive, depression, and falls Referrals and appointments  In addition, I have reviewed and discussed with patient certain preventive protocols, quality metrics,  and best practice recommendations. A written personalized care plan for preventive services as well as general preventive health recommendations were provided to patient.     [name], LPN   10/11/1658   Nurse Notes: Pt states she is doing well. Up to date on health maintenance, bone density ordered for today. Pt due for flu, td, and shingles. Advised pt on how to obtain. Declines low dose CT scan at this time.

## 2020-10-24 ENCOUNTER — Ambulatory Visit: Payer: Medicare HMO | Admitting: Family Medicine

## 2020-10-24 ENCOUNTER — Other Ambulatory Visit: Payer: Self-pay | Admitting: Family Medicine

## 2020-11-23 ENCOUNTER — Other Ambulatory Visit: Payer: Self-pay | Admitting: Family Medicine

## 2020-12-08 ENCOUNTER — Other Ambulatory Visit: Payer: Self-pay | Admitting: Family Medicine

## 2020-12-27 ENCOUNTER — Ambulatory Visit: Payer: Medicare HMO | Admitting: Family Medicine

## 2021-01-02 ENCOUNTER — Ambulatory Visit: Payer: Medicare HMO

## 2021-01-02 ENCOUNTER — Ambulatory Visit (INDEPENDENT_AMBULATORY_CARE_PROVIDER_SITE_OTHER): Payer: Medicare HMO | Admitting: Family Medicine

## 2021-01-02 ENCOUNTER — Other Ambulatory Visit: Payer: Self-pay

## 2021-01-02 ENCOUNTER — Encounter: Payer: Self-pay | Admitting: Family Medicine

## 2021-01-02 VITALS — BP 148/82 | HR 82 | Resp 18 | Ht 65.0 in | Wt 215.0 lb

## 2021-01-02 DIAGNOSIS — M797 Fibromyalgia: Secondary | ICD-10-CM | POA: Diagnosis not present

## 2021-01-02 DIAGNOSIS — M5136 Other intervertebral disc degeneration, lumbar region: Secondary | ICD-10-CM

## 2021-01-02 DIAGNOSIS — G8929 Other chronic pain: Secondary | ICD-10-CM

## 2021-01-02 DIAGNOSIS — M25572 Pain in left ankle and joints of left foot: Secondary | ICD-10-CM | POA: Diagnosis not present

## 2021-01-02 DIAGNOSIS — M25561 Pain in right knee: Secondary | ICD-10-CM

## 2021-01-02 DIAGNOSIS — M25562 Pain in left knee: Secondary | ICD-10-CM | POA: Diagnosis not present

## 2021-01-02 DIAGNOSIS — Z23 Encounter for immunization: Secondary | ICD-10-CM

## 2021-01-02 MED ORDER — CELECOXIB 200 MG PO CAPS: 200.0000 mg | ORAL_CAPSULE | Freq: Every day | ORAL | 3 refills | Status: DC

## 2021-01-02 NOTE — Progress Notes (Signed)
Subjective:    Patient ID: [name], female    DOB: Sep 03, 1945, 75 y.o.   MRN: [id number]  HPI  Patient is here today requesting pain medication.  When I asked her to be specific, she states that she hurts all over her body.  I then asked her to try to pinpoint this exact location of her pain.  She states that she hurts in both hips posteriorly.  She hurts in both knees.  She hurts in both of her feet primarily her ankles.  She saw orthopedics having some.  She had x-rays obtained of both knees which showed moderate arthritis in the left and severe arthritis in the medial compartment on the right.  Orthopedics performed cortisone injections that helped temporarily.  However the patient states that she does not want to have to drive all the way to [place] cortisone shots.  "She wants them fixed".  She also saw podiatry who stated that there was nothing they can do to help her ankle other than to fuse the ankle due to arthritis between the tibia and talus.  She is not taking the Naprosyn prescribed by orthopedics because it upsets her stomach.  She also complains of burning posterior hip pain which is alleviated somewhat by gabapentin.  This is likely due to her degenerative disc disease and back.  For all of these reasons, she is very sedentary.  She states that her son is mad at her because he will not get up and try doing anything to get healthier such as exercise or walk.  Her son is concerned that she is going to continue to decline in health due to her sedentary nature.  I told the patient that I agree with her findings concern.  Sedentary lifestyle could lead to weakening of the muscles in her hips and legs and ultimately inability to perform activities of daily living.  However the patient states she hurts too much to get up and walk.  Therefore I spent our entire visit on trying to focus on ways to improve her pain.  Past Medical History:  Diagnosis Date   Allergy    Anxiety     Arthritis    Asthma    Bursitis    Cataract    COPD (chronic obstructive pulmonary disease) (HCC)    Depression    Hiatal hernia    Hypercholesteremia    Hypertension    Type 2 diabetes mellitus ([place])    Ulcer    Past Surgical History:  Procedure Laterality Date   ABDOMINAL HYSTERECTOMY     ANKLE ARTHROSCOPY WITH DRILLING/MICROFRACTURE Left 10/12/2015   Procedure: LEFT ANKLE ARTHROSCOPY, REMOVAL OF OSTEOCHONDRAL LESION WITH MICROFRACTURE;  Surgeon: Trula Slade, DPM;  Location: [place] [facility];  Service: Podiatry;  Laterality: Left;   BIOPSY  10/24/2016   Procedure: BIOPSY;  Surgeon: Daneil Dolin, MD;  [facility];  Service: Endoscopy;;  gastric esophageal   CATARACT EXTRACTION [facility]  10/16/2010   Procedure: CATARACT EXTRACTION PHACO AND INTRAOCULAR LENS PLACEMENT ([place]);  Surgeon: Tonny Branch;  Location: [facility];  Service: Ophthalmology;  Laterality: Left;  CDE: 6.99   CATARACT EXTRACTION W/PHACO  12/18/2010   Procedure: CATARACT EXTRACTION PHACO AND INTRAOCULAR LENS PLACEMENT ([facility]);  Surgeon: Tonny Branch;  Location: [facility];  Service: Ophthalmology;  Laterality: Right;  CDE 12.50   COLONOSCOPY     about 5 years ago/[facility]   COLONOSCOPY WITH PROPOFOL N/A 10/24/2016   Procedure: COLONOSCOPY WITH PROPOFOL;  Surgeon:  Rourk, Cristopher Estimable, MD;  [facility]: [facility];  Service: Endoscopy;  Laterality: N/A;  215   ESOPHAGOGASTRODUODENOSCOPY  2003   Gastritis, path unavailable.   ESOPHAGOGASTRODUODENOSCOPY (EGD) WITH PROPOFOL N/A 10/24/2016   Procedure: ESOPHAGOGASTRODUODENOSCOPY (EGD) WITH PROPOFOL;  Surgeon: Daneil Dolin, MD;  [facility];  Service: Endoscopy;  Laterality: N/A;   EYE SURGERY     fracture lower back     left elbow tendon repair     NEUROPLASTY / TRANSPOSITION MEDIAN NERVE AT CARPAL TUNNEL BILATERAL     plantar fasc bilateral     right rotator cuff     rt foot reconstruction     trigger thumb     Left   Current Outpatient  Medications on File Prior to Visit  Medication Sig Dispense Refill   Accu-Chek Softclix Lancets lancets Use as directed to monitor FSBS 2x daily. Dx: E11.9 200 each 3   Alcohol Swabs (DROPSAFE ALCOHOL PREP) 70 % PADS USE AS DIRECTED TO MONITOR FINGERSTICK BLOOD SUGAR TWICE DAILY 200 each 0   atorvastatin (LIPITOR) 80 MG tablet TAKE 1 TABLET EVERY DAY 90 tablet 1   Blood Glucose Calibration (ACCU-CHEK AVIVA) SOLN USE FOR GLUCOMETER COLLABORATION 1 each 0   Blood Glucose Monitoring Suppl (ACCU-CHEK AVIVA PLUS) w/Device KIT Use as directed to monitor FSBS 2x daily. Dx: E11.9 1 kit 1   budesonide-formoterol (SYMBICORT) 160-4.5 MCG/ACT inhaler Inhale 2 puffs into the lungs 2 (two) times daily. 3 each 3   gabapentin (NEURONTIN) 300 MG capsule TAKE 1 CAPSULE THREE TIMES DAILY 90 capsule 3   glucose blood (ACCU-CHEK AVIVA PLUS) test strip Use as directed to monitor FSBS 2x daily. Dx: E11.9 200 strip 3   levocetirizine (XYZAL) 5 MG tablet Take 1 tablet (5 mg total) by mouth every evening. 90 tablet 3   metoprolol succinate (TOPROL-XL) 25 MG 24 hr tablet TAKE 1 TABLET EVERY DAY 90 tablet 3   montelukast (SINGULAIR) 10 MG tablet TAKE 1 TABLET (10 MG TOTAL) BY MOUTH AT BEDTIME. 90 tablet 3   pioglitazone (ACTOS) 15 MG tablet TAKE 1 TABLET EVERY DAY 90 tablet 1   Polyethyl Glycol-Propyl Glycol (SYSTANE OP) Apply 1 drop to eye 3 (three) times daily.     traZODone (DESYREL) 50 MG tablet TAKE 3 TABLETS BY MOUTH AT BEDTIME AS NEEDED FOR SLEEP. 270 tablet 2   venlafaxine XR (EFFEXOR-XR) 75 MG 24 hr capsule TAKE 2 CAPSULES EVERY DAY 180 capsule 1   VENTOLIN HFA 108 (90 Base) MCG/ACT inhaler INHALE 2 PUFFS EVERY 4 TO 6 HOURS AS NEEDED FOR WHEEZING AND SHORTNESS OF BREATH. BRAND NAME ONLY 18 g 3   naproxen (NAPROSYN) 500 MG tablet Take 1 tablet (500 mg total) by mouth 2 (two) times daily with a meal. (Patient not taking: Reported on 01/02/2021) 60 tablet 5   No current facility-administered medications on file prior  to visit.   Allergies  Allergen Reactions   Codeine Itching   Flonase [Fluticasone Propionate] Cough    Severe coughing   Metformin And Related Diarrhea    Caused severe diarrhea and severe GI symptoms---even on low dose 580m BID   Social History   Socioeconomic History   Marital status: Single    Spouse name: Not on file   Number of children: 2   Years of education: Not on file   Highest education level: Not on file  Occupational History   Not on file  Tobacco Use   Smoking status: Some Days  Packs/day: 0.50    Years: 52.00    Pack years: 26.00    Types: Cigarettes   Smokeless tobacco: Never   Tobacco comments:    10/20/20-declines low dose CT scan  Vaping Use   Vaping Use: Never used  Substance and Sexual Activity   Alcohol use: Yes    Comment: rare   Drug use: Yes    Frequency: 3.0 times per week    Types: Marijuana    Comment: not in a few months    Sexual activity: Not Currently    Birth control/protection: Surgical  Other Topics Concern   Not on file  Social History Narrative   2 sons, 1 son lives with patient.   2 boys 3 girls grandchildren, 3 great grandchildren.   Social Determinants of Health   Financial Resource Strain: Low Risk    Difficulty of Paying Living Expenses: Not hard at all  Food Insecurity: No Food Insecurity   Worried About Charity fundraiser in the Last Year: Never true   [place] in the Last Year: Never true  Transportation Needs: No Transportation Needs   Lack of Transportation (Medical): No   Lack of Transportation (Non-Medical): No  Physical Activity: Insufficiently Active   Days of Exercise per Week: 3 days   Minutes of Exercise per Session: 30 min  Stress: No Stress Concern Present   Feeling of Stress : Not at all  Social Connections: Socially Isolated   Frequency of Communication with Friends and Family: More than three times a week   Frequency of Social Gatherings with Friends and Family: More than three times a  week   Attends Religious Services: Never   Marine scientist or Organizations: No   Attends Archivist Meetings: Never   Marital Status: Divorced  Human resources officer Violence: Not At Risk   Fear of Current or Ex-Partner: No   Emotionally Abused: No   Physically Abused: No   Sexually Abused: No      Review of Systems  All other systems reviewed and are negative.     Objective:   Physical Exam Vitals reviewed.  Constitutional:      General: She is not in acute distress.    Appearance: Normal appearance. She is obese. She is not ill-appearing or toxic-appearing.  HENT:     Right Ear: Tympanic membrane and ear canal normal.     Left Ear: Tympanic membrane and ear canal normal.     Nose: Nose normal.  Cardiovascular:     Rate and Rhythm: Normal rate and regular rhythm.     Heart sounds: Murmur heard.  Pulmonary:     Effort: Pulmonary effort is normal. No respiratory distress.     Breath sounds: No stridor. Wheezing present. No rhonchi or rales.  Musculoskeletal:     Right hip: No bony tenderness. Decreased range of motion.     Left hip: No bony tenderness. Decreased range of motion.     Right knee: Decreased range of motion. Tenderness present over the medial joint line.     Left knee: Decreased range of motion. Tenderness present over the medial joint line.     Right lower leg: No edema.     Left lower leg: No edema.     Right ankle: Decreased range of motion.     Left ankle: Swelling present. Tenderness present. Decreased range of motion.  Lymphadenopathy:     Cervical: No cervical adenopathy.  Neurological:  Mental Status: She is alert.          Assessment & Plan:  Need for immunization against influenza - Plan: Flu Vaccine QUAD High Dose(Fluad)  DDD (degenerative disc disease), lumbar  Fibromyalgia  Chronic pain of both knees  Chronic pain of left ankle Patient is not currently taking any NSAID.  Recommended starting Celebrex 200 mg daily  to try to help with her multifactorial pain including her arthritis in her knees and ankle and degenerative disc disease.  Also performed cortisone shots in both knees.  Using sterile technique, I injected the right knee with 2 cc lidocaine, 2 cc of Marcaine, and 2 cc of 40 mg/mL Kenalog.  She tolerated this procedure well without complication.  I then injected the left knee using the same technique and medication.  She tolerated this as well.  Also recommended physical therapy with patient is resistant to physical therapy.  I encouraged physical therapy to try to prevent deconditioning.  If the patient changes her mind I will be glad to consult physical therapy moving forward.

## 2021-01-26 ENCOUNTER — Ambulatory Visit: Payer: Medicare HMO | Admitting: Family Medicine

## 2021-01-26 NOTE — Progress Notes (Signed)
Chronic Care Management Pharmacy Note  02/02/2021 Name:  [name] MRN:  [id number] DOB:  [birth year]  Subjective: [name] is an 76 y.o. year old female who is a primary patient of Pickard, Cammie Mcgee, MD.  The CCM team was consulted for assistance with disease management and care coordination needs.    Engaged with patient by telephone for follow up visit in response to provider referral for pharmacy case management and/or care coordination services.   Consent to Services:  The patient was given the following information about Chronic Care Management services today, agreed to services, and gave verbal consent: 1. CCM service includes personalized support from designated clinical staff supervised by the primary care provider, including individualized plan of care and coordination with other care providers 2. 24/7 contact phone numbers for assistance for urgent and routine care needs. 3. Service will only be billed when office clinical staff spend 20 minutes or more in a month to coordinate care. 4. Only one practitioner may furnish and bill the service in a calendar month. 5.The patient may stop CCM services at any time (effective at the end of the month) by phone call to the office staff. 6. The patient will be responsible for cost sharing (co-pay) of up to 20% of the service fee (after annual deductible is met). Patient agreed to services and consent obtained.  Patient Care Team: Susy Frizzle, MD as PCP - General (Family Medicine) Gala Romney Cristopher Estimable, MD as Consulting Physician (Gastroenterology) Edythe Clarity, [facility] as Pharmacist (Pharmacist)  Recent office visits: None since last CCM visit  Recent consult visits: None since last CCM visit on 10/23/19  Hospital visits: None in previous 6 months  Objective:  Lab Results  Component Value Date   CREATININE 0.95 (H) 04/18/2020   BUN 17 04/18/2020   GFRNONAA 60 04/18/2020   GFRAA 69 04/18/2020   NA 140 04/18/2020    K 4.7 04/18/2020   CALCIUM 9.3 04/18/2020   CO2 28 04/18/2020   GLUCOSE 98 04/18/2020    Lab Results  Component Value Date/Time   HGBA1C 6.0 (H) 04/18/2020 02:57 PM   HGBA1C 6.0 (H) 09/07/2019 02:58 PM   HGBA1C 6.1 (H) 12/07/2015 11:51 AM   MICROALBUR 1.1 09/07/2019 02:58 PM   MICROALBUR 0.5 01/24/2017 12:28 PM    Last diabetic Eye exam: No results found for: HMDIABEYEEXA  Last diabetic Foot exam: No results found for: HMDIABFOOTEX   Lab Results  Component Value Date   CHOL 157 04/18/2020   HDL 53 04/18/2020   LDLCALC 83 04/18/2020   TRIG 117 04/18/2020   CHOLHDL 3.0 04/18/2020    Hepatic Function Latest Ref Rng & Units 04/18/2020 09/07/2019 05/21/2019  Total Protein 6.1 - 8.1 g/dL 7.3 7.0 7.1  Albumin 3.6 - 5.1 g/dL - - -  AST 10 - 35 U/L '24 24 25  ' ALT 6 - 29 U/L '17 18 19  ' Alk Phosphatase 33 - 130 U/L - - -  Total Bilirubin 0.2 - 1.2 mg/dL 0.4 0.4 0.3  Bilirubin, Direct <=0.2 mg/dL - - -    Lab Results  Component Value Date/Time   TSH 1.76 02/28/2015 09:56 AM    CBC Latest Ref Rng & Units 04/18/2020 05/21/2019 12/11/2018  WBC 3.8 - 10.8 Thousand/uL 12.7(H) 13.0(H) 14.1(H)  Hemoglobin 11.7 - 15.5 g/dL 12.3 13.3 11.8  Hematocrit 35.0 - 45.0 % 37.7 40.5 36.2  Platelets 140 - 400 Thousand/uL 326 388 350    No results found for: VD25OH  Clinical ASCVD: No  The 10-year ASCVD risk score (Arnett DK, et al., 2019) is: 51.5%   Values used to calculate the score:     Age: 19 years     Sex: Female     Is Non-Hispanic African American: No     Diabetic: Yes     Tobacco smoker: Yes     Systolic Blood Pressure: 244 mmHg     Is BP treated: Yes     HDL Cholesterol: 53 mg/dL     Total Cholesterol: 157 mg/dL    Depression screen [facility] 2/9 10/20/2020 04/18/2020 01/24/2017  Decreased Interest 0 2 1  Down, Depressed, Hopeless 0 2 2  PHQ - 2 Score 0 4 3  Altered sleeping - 3 3  Tired, decreased energy - 3 3  Change in appetite - 3 3  Feeling bad or failure about yourself  - 2 2   Trouble concentrating - 2 2  Moving slowly or fidgety/restless - 0 1  Suicidal thoughts - 0 0  PHQ-9 Score - 17 17  Difficult doing work/chores - Very difficult Somewhat difficult      Social History   Tobacco Use  Smoking Status Some Days   Packs/day: 0.50   Years: 52.00   Pack years: 26.00   Types: Cigarettes  Smokeless Tobacco Never  Tobacco Comments   10/20/20-declines low dose CT scan   BP Readings from Last 3 Encounters:  01/02/21 (!) 148/82  08/02/20 138/67  04/18/20 138/88   Pulse Readings from Last 3 Encounters:  01/02/21 82  08/02/20 (!) 105  04/18/20 90   Wt Readings from Last 3 Encounters:  01/02/21 215 lb (97.5 kg)  10/20/20 215 lb (97.5 kg)  08/02/20 218 lb (98.9 kg)   BMI Readings from Last 3 Encounters:  01/02/21 35.78 kg/m  10/20/20 35.78 kg/m  08/02/20 35.19 kg/m    Assessment/Interventions: Review of patient past medical history, allergies, medications, health status, including review of consultants reports, laboratory and other test data, was performed as part of comprehensive evaluation and provision of chronic care management services.   SDOH:  (Social Determinants of Health) assessments and interventions performed: Yes  SDOH Screenings   Alcohol Screen: Low Risk    Last Alcohol Screening Score (AUDIT): 0  Depression (PHQ2-9): Low Risk    PHQ-2 Score: 0  Financial Resource Strain: Low Risk    Difficulty of Paying Living Expenses: Not hard at all  Food Insecurity: No Food Insecurity   Worried About Charity fundraiser in the Last Year: Never true   Ran Out of Food in the Last Year: Never true  Housing: Low Risk    Last Housing Risk Score: 0  Physical Activity: Insufficiently Active   Days of Exercise per Week: 3 days   Minutes of Exercise per Session: 30 min  Social Connections: Socially Isolated   Frequency of Communication with Friends and Family: More than three times a week   Frequency of Social Gatherings with Friends and  Family: More than three times a week   Attends Religious Services: Never   Marine scientist or Organizations: No   Attends Music therapist: Never   Marital Status: Divorced  Stress: No Stress Concern Present   Feeling of Stress : Not at all  Tobacco Use: High Risk   Smoking Tobacco Use: Some Days   Smokeless Tobacco Use: Never   Passive Exposure: Not on file  Transportation Needs: No Transportation Needs   Lack of Transportation (  Medical): No   Lack of Transportation (Non-Medical): No    CCM Care Plan  Allergies  Allergen Reactions   Codeine Itching   Flonase [Fluticasone Propionate] Cough    Severe coughing   Metformin And Related Diarrhea    Caused severe diarrhea and severe GI symptoms---even on low dose 585m BID    Medications Reviewed Today     Reviewed by [facility]Edythe Clarity [facility](Pharmacist) on 02/02/21 at 1533  Med List Status: <None>   Medication Order Taking? Sig Documenting Provider Last Dose Status Informant  Accu-Chek Softclix Lancets lancets [id number] Use as directed to monitor FSBS 2x daily. Dx: E11.9 PSusy Frizzle MD Taking Active   Alcohol Swabs (DROPSAFE ALCOHOL PREP) 70 % PADS [id number] USE AS DIRECTED TO MONITOR FINGERSTICK BLOOD SUGAR TWICE DAILY PSusy Frizzle MD Taking Active   atorvastatin (LIPITOR) 80 MG tablet [id number] TAKE 1 TABLET EVERY DAY PSusy Frizzle MD Taking Active   Blood Glucose Calibration (APerry SOLN [id number] USE FOR GLUCOMETER COLLABORATION PSusy Frizzle MD Taking Active   Blood Glucose Monitoring Suppl (ACCU-CHEK AVIVA PLUS) w/Device KIT [id number] Use as directed to monitor FSBS 2x daily. Dx: E11.9 PSusy Frizzle MD Taking Active   budesonide-formoterol ([facility] 160-4.5 MCG/ACT inhaler [id number] Inhale 2 puffs into the lungs 2 (two) times daily. PSusy Frizzle MD Taking Active   celecoxib (CELEBREX) 200 MG capsule [id number] Take 1 capsule (200 mg  total) by mouth daily. PSusy Frizzle MD Taking Active   gabapentin (NEURONTIN) 300 MG capsule [id number] TAKE 1 CAPSULE THREE TIMES DAILY PSusy Frizzle MD Taking Active   glucose blood (ACCU-CHEK AVIVA PLUS) test strip [id number] Use as directed to monitor FSBS 2x daily. Dx: E11.9 PSusy Frizzle MD Taking Active   levocetirizine (XYZAL) 5 MG tablet [id number] TAKE 1 TABLET EVERY EVENING PSusy Frizzle MD Taking Active   metoprolol succinate (TOPROL-XL) 25 MG 24 hr tablet [id number] TAKE 1 TABLET EVERY DAY PSusy Frizzle MD Taking Active   montelukast (SINGULAIR) 10 MG tablet [id number] TAKE 1 TABLET (10 MG TOTAL) BY MOUTH AT BEDTIME. PSusy Frizzle MD Taking Active   pioglitazone (ACTOS) 15 MG tablet [id number] TAKE 1 TABLET EVERY DAY PSusy Frizzle MD Taking Active   Polyethyl Glycol-Propyl Glycol (SYSTANE OP) [id number] Apply 1 drop to eye 3 (three) times daily. [provider] Taking Active Self  traZODone (DESYREL) 50 MG tablet [id number] TAKE 3 TABLETS BY MOUTH AT BEDTIME AS NEEDED FOR SLEEP. PSusy Frizzle MD Taking Active   venlafaxine XR (EFFEXOR-XR) 75 MG 24 hr capsule [id number] TAKE 2 CAPSULES EVERY DAY PSusy Frizzle MD Taking Active   VENTOLIN HFA 108 (90 Base) MCG/ACT inhaler [id number] INHALE 2 PUFFS EVERY 4 TO 6 HOURS AS NEEDED FOR WHEEZING AND SHORTNESS OF BREATH. BRAND NAME ONLY PSusy Frizzle MD Taking Active             Patient Active Problem List   Diagnosis Date Noted   Body mass index (BMI) 35.0-35.9, adult 07/13/2019   Anxiety and depression 084/53/6468  Helicobacter pylori gastritis 01/18/2017   Abdominal pain, epigastric 09/21/2016   RUQ pain 09/21/2016   Hx of adenomatous colonic polyps 09/21/2016   GERD (gastroesophageal reflux disease) 09/21/2016   Chronic kidney disease (CKD), stage III (moderate) ([place] 12/07/2015   Osteochondral lesion of talar dome 10/21/2015   Panic disorder  03/28/2015   Tobacco use disorder 01/12/2015   Foot pain, bilateral 01/12/2015   COPD (chronic obstructive pulmonary disease) ([place])    Depression    Diabetes mellitus without complication ([place])    Hypercholesteremia    Hypertension    Anxiety 02/27/2013   PERSISTENT DISORDER INITIATING/MAINTAINING SLEEP 11/26/2008   Obstructive sleep apnea 11/26/2008   HYPOXEMIA 11/26/2008   HIP PAIN 03/10/2007   [place] DEGENERATION 03/10/2007   BURSITIS, HIP 03/10/2007    Immunization History  Administered Date(s) Administered   Fluad Quad(high Dose 65+) 12/11/2018, 01/02/2021   Influenza, High Dose Seasonal PF 12/24/2016, 12/09/2017   Influenza,inj,Quad PF,6+ Mos 01/12/2015, 12/07/2015   Influenza-Unspecified 02/27/2013   Pneumococcal Conjugate-13 01/23/2004, 01/12/2015   Tdap 11/30/2008    Conditions to be addressed/monitored:  HTN, COPD, GERD, Diabetes, CKD, Depression/Anxiety, hypercholesterolemia,.  Care Plan : General Pharmacy (Adult)  Updates made by Edythe Clarity, RPH since 02/02/2021 12:00 AM     Problem: HTN, HLD, COPD, DM   Priority: High  Onset Date: 04/25/2020     Goal: Patient-Specific Goal   Start Date: 04/25/2020  Expected End Date: 10/25/2020  Recent Progress: On track  Priority: High  Note:   Current Barriers:  Unable to independently afford treatment regimen  Pharmacist Clinical Goal(s):  Patient will verbalize ability to afford treatment regimen maintain control of blood glucose and blood pressure as evidenced by home monitiring  contact provider office for questions/concerns as evidenced notation of same in electronic health record through collaboration with PharmD and provider.   Interventions: 1:1 collaboration with Susy Frizzle, MD regarding development and update of comprehensive plan of care as evidenced by provider attestation and co-signature Inter-disciplinary care team collaboration (see longitudinal plan of care) Comprehensive medication review  performed; medication list updated in electronic medical record  Hypertension (BP goal <130/80) -Controlled -Current treatment: Metoprolol XL 53m daily -Medications previously tried: lisinopril (d/c) -Current home readings: no logs available  -Current exercise habits: minimal lately, patient having pain that limits physical activity, able to do ADLs with no concern -Reports hypotensive/hypertensive symptoms - dizziness, possible vertigo seems to be unrelated to BP -Educated on BP goals and benefits of medications for prevention of heart attack, stroke and kidney damage; Importance of home blood pressure monitoring; -Counseled to monitor BP at home once or twice weekly, document, and provide log at future appointments -Recommended to continue current medication  Hyperlipidemia: (LDL goal < 100) -Controlled -Current treatment: Atorvastatin 891m-Medications previously tried: none noted  -Educated on Cholesterol goals;  Benefits of statin for ASCVD risk reduction; Importance of limiting foods high in cholesterol;  -Reviewed most recent lipid panel -Recommended to continue current medication  Diabetes (A1c goal <7%) -Controlled -Current medications: Pioglitazone 1553maily -Medications previously tried: metformin (GI issues)  -Current home glucose readings fasting glucose: 109-116 post prandial glucose: not available -Denies hypoglycemic/hyperglycemic symptoms -She denies any recent episodes of hypoglycemia -Educated on A1c and blood sugar goals; Prevention and management of hypoglycemic episodes; Benefits of routine self-monitoring of blood sugar; -Counseled to check feet daily and get yearly eye exams -Recommended to continue current medication Consider d/c of pioglitazone pending next A1c.  If A1c < 6.0 she may could trial off pioglitazone to manage hypoglycemia risk.  Update 08/01/20  She has had no lower end blood sugars since our last conversation.  Averages between  90-120.  Continues to take Actos daily.  Recommend she follow up in in about 4 months for follow up A1c with Dr. PicDennard SchaumannUpdate 02/02/21  Patient is due for updated A1c and diabetic foot/eye exam. Recommended she schedule for physical with PCP sometime after March of this year. Denies any hypoglycemia. Blood sugars are ranging from 120s to 150s per her home reports. No changes at this time - if A1c is low patient again could trial off pioglitazone to see if she can manage with diet/exercise alone.  COPD (Goal: control symptoms and prevent exacerbations) -Controlled -Current treatment  Symbicort 160-4.59mg -Appropriate, Effective, Safe, Accessible Ventolin HFA 992m prn -Appropriate, Effective, Safe, Accessible -Medications previously tried: none noted  -Exacerbations requiring treatment in last 6 months: none -Patient denies consistent use of maintenance inhaler - still only using it once daily due to cost concern.  -Frequency of rescue inhaler use: daily prn -Counseled on Proper inhaler technique; Benefits of consistent maintenance inhaler use Differences between maintenance and rescue inhalers -Recommended to continue current medication Assessed patient finances. Will have Veronica reach out to her and help with process of applying for PAP for Symbicort.  Update 08/01/20 Now receiving Symbicort through patient assistance program.  She reports she is not taking it twice daily consistently.  Not having to use her Ventolin often at all. No changes recommended.  Will need to renew application in late [id number] next year.   Update 02/02/21 She has not received her Symbicort in a while and reports she is out of the medication. [place]  Completed renewal steps to have medication delivered to patient's home. She was rarely having to use Albuterol HFA while she was taking Symbicort. Denies any shortness of breath unless she over exerts. Stressed importance of  staying on Symbicort for maintenance, No changes to meds - work on [employer]  Patient Goals/Self-Care Activities Patient will:  - take medications as prescribed check glucose daily, document, and provide at future appointments check blood pressure a few times per week, document, and provide at future appointments collaborate with provider on medication access solutions.  Follow Up Plan: The care management team will reach out to the patient again over the next 180 days.          Care Gaps: Foot exam Eye Exam Need A1c  Star meds: Atorvastatin 11/19/20 90ds   Medication Assistance:  Symbicort obtained through PAP medication assistance program.  Enrollment ends 01/21/21  Patient's preferred pharmacy is:  [place]: [phone]: [id number] [place] [facility] [id number] [place]one: [phone]: [phone] pill box? Yes Pt endorses 100% compliance  We discussed: Benefits of medication synchronization, packaging and delivery as well as enhanced pharmacist oversight with [employer]. Patient decided to: Continue current medication management strategy  Care Plan and Follow Up Patient Decision:  Patient agrees to Care Plan and Follow-up.  Plan: The care management team will reach out to the patient again over the next 180 days.  ChrBeverly MilchharmD Clinical Pharmacist [phone]

## 2021-02-01 ENCOUNTER — Other Ambulatory Visit: Payer: Self-pay | Admitting: Family Medicine

## 2021-02-02 ENCOUNTER — Ambulatory Visit (INDEPENDENT_AMBULATORY_CARE_PROVIDER_SITE_OTHER): Payer: Medicare HMO | Admitting: Pharmacist

## 2021-02-02 DIAGNOSIS — E119 Type 2 diabetes mellitus without complications: Secondary | ICD-10-CM

## 2021-02-02 DIAGNOSIS — J439 Emphysema, unspecified: Secondary | ICD-10-CM

## 2021-02-02 NOTE — Patient Instructions (Addendum)
Visit Information   Goals Addressed   None    Patient Care Plan: General Pharmacy (Adult)     Problem Identified: HTN, HLD, COPD, DM   Priority: High  Onset Date: 04/25/2020     Goal: Patient-Specific Goal   Start Date: 04/25/2020  Expected End Date: 10/25/2020  Recent Progress: On track  Priority: High  Note:   Current Barriers:  Unable to independently afford treatment regimen  Pharmacist Clinical Goal(s):  Patient will verbalize ability to afford treatment regimen maintain control of blood glucose and blood pressure as evidenced by home monitiring  contact provider office for questions/concerns as evidenced notation of same in electronic health record through collaboration with PharmD and provider.   Interventions: 1:1 collaboration with Susy Frizzle, MD regarding development and update of comprehensive plan of care as evidenced by provider attestation and co-signature Inter-disciplinary care team collaboration (see longitudinal plan of care) Comprehensive medication review performed; medication list updated in electronic medical record  Hypertension (BP goal <130/80) -Controlled -Current treatment: Metoprolol XL 25mg  daily -Medications previously tried: lisinopril (d/c) -Current home readings: no logs available  -Current exercise habits: minimal lately, patient having pain that limits physical activity, able to do ADLs with no concern -Reports hypotensive/hypertensive symptoms - dizziness, possible vertigo seems to be unrelated to BP -Educated on BP goals and benefits of medications for prevention of heart attack, stroke and kidney damage; Importance of home blood pressure monitoring; -Counseled to monitor BP at home once or twice weekly, document, and provide log at future appointments -Recommended to continue current medication  Hyperlipidemia: (LDL goal < 100) -Controlled -Current treatment: Atorvastatin 80mg  -Medications previously tried: none noted   -Educated on Cholesterol goals;  Benefits of statin for ASCVD risk reduction; Importance of limiting foods high in cholesterol;  -Reviewed most recent lipid panel -Recommended to continue current medication  Diabetes (A1c goal <7%) -Controlled -Current medications: Pioglitazone 15mg  daily -Medications previously tried: metformin (GI issues)  -Current home glucose readings fasting glucose: 109-116 post prandial glucose: not available -Denies hypoglycemic/hyperglycemic symptoms -She denies any recent episodes of hypoglycemia -Educated on A1c and blood sugar goals; Prevention and management of hypoglycemic episodes; Benefits of routine self-monitoring of blood sugar; -Counseled to check feet daily and get yearly eye exams -Recommended to continue current medication Consider d/c of pioglitazone pending next A1c.  If A1c < 6.0 she may could trial off pioglitazone to manage hypoglycemia risk.  Update 08/01/20  She has had no lower end blood sugars since our last conversation.  Averages between 90-120.  Continues to take Actos daily.  Recommend she follow up in in about 4 months for follow up A1c with Dr. Dennard Schaumann.  Update 02/02/21 Patient is due for updated A1c and diabetic foot/eye exam. Recommended she schedule for physical with PCP sometime after March of this year. Denies any hypoglycemia. Blood sugars are ranging from 120s to 150s per her home reports. No changes at this time - if A1c is low patient again could trial off pioglitazone to see if she can manage with diet/exercise alone.  COPD (Goal: control symptoms and prevent exacerbations) -Controlled -Current treatment  Symbicort 160-4.50mcg -Appropriate, Effective, Safe, Accessible Ventolin HFA 70mcg prn -Appropriate, Effective, Safe, Accessible -Medications previously tried: none noted  -Exacerbations requiring treatment in last 6 months: none -Patient denies consistent use of maintenance inhaler - still only using it once  daily due to cost concern.  -Frequency of rescue inhaler use: daily prn -Counseled on Proper inhaler technique; Benefits of consistent maintenance inhaler use  Differences between maintenance and rescue inhalers -Recommended to continue current medication Assessed patient finances. Will have Veronica reach out to her and help with process of applying for PAP for Symbicort.  Update 08/01/20 Now receiving Symbicort through patient assistance program.  She reports she is not taking it twice daily consistently.  Not having to use her Ventolin often at all. No changes recommended.  Will need to renew application in late 8891 for next year.   Update 02/02/21 She has not received her Symbicort in a while and reports she is out of the medication. Due for renewal to [place] program.  Completed renewal steps to have medication delivered to patient's home. She was rarely having to use Albuterol HFA while she was taking Symbicort. Denies any shortness of breath unless she over exerts. Stressed importance of staying on Symbicort for maintenance, No changes to meds - work on [employer]   Patient Goals/Self-Care Activities Patient will:  - take medications as prescribed check glucose daily, document, and provide at future appointments check blood pressure a few times per week, document, and provide at future appointments collaborate with provider on medication access solutions.  Follow Up Plan: The care management team will reach out to the patient again over the next 180 days.           Patient verbalizes understanding of instructions and care plan provided today and agrees to view in [place]. Active MyChart status confirmed with patient.   Telephone follow up appointment with pharmacy team member scheduled for: 6 months  Edythe Clarity, [place], PharmD, [place] Clinical Pharmacist Practitioner [place] [phone]

## 2021-02-08 ENCOUNTER — Other Ambulatory Visit: Payer: Self-pay

## 2021-02-08 MED ORDER — GABAPENTIN 300 MG PO CAPS: ORAL_CAPSULE | ORAL | 3 refills | Status: DC

## 2021-02-20 ENCOUNTER — Other Ambulatory Visit: Payer: Self-pay | Admitting: Family Medicine

## 2021-02-21 DIAGNOSIS — I10 Essential (primary) hypertension: Secondary | ICD-10-CM | POA: Diagnosis not present

## 2021-02-21 DIAGNOSIS — E785 Hyperlipidemia, unspecified: Secondary | ICD-10-CM

## 2021-02-21 DIAGNOSIS — J439 Emphysema, unspecified: Secondary | ICD-10-CM | POA: Diagnosis not present

## 2021-02-21 DIAGNOSIS — E119 Type 2 diabetes mellitus without complications: Secondary | ICD-10-CM | POA: Diagnosis not present

## 2021-04-04 ENCOUNTER — Telehealth: Payer: Self-pay | Admitting: Family Medicine

## 2021-04-04 MED ORDER — ALBUTEROL SULFATE HFA 108 (90 BASE) MCG/ACT IN AERS: 1.0000 | INHALATION_SPRAY | Freq: Four times a day (QID) | RESPIRATORY_TRACT | 1 refills | Status: DC | PRN

## 2021-04-04 NOTE — Telephone Encounter (Signed)
New rx for ProAir sent to pharmacy.  ?

## 2021-04-04 NOTE — Telephone Encounter (Signed)
New script requested. Please advise.  ?

## 2021-04-04 NOTE — Telephone Encounter (Signed)
Received eFax from pharmacy to request refill of albuterol (VENTOLIN HFA) 108 (90 Base) MCG/ACT inhaler [[id number]]  ? ?Pharmacy info: ? ?[facility]  ?[place], [place] [address]  ?Phone:  [phone]  Fax:  [phone]  ? ?Please advise pharmacist.  ?

## 2021-04-07 ENCOUNTER — Telehealth: Payer: Self-pay | Admitting: Family Medicine

## 2021-04-07 NOTE — Telephone Encounter (Signed)
Received eFax from pharmacy to request new script of ? ?gabapentin (NEURONTIN) 300 MG capsule [[id number]]  ? ?Pharmacy fax received from:  ? ?[facility]  ?[place], [place] [address]  ?Phone:  [phone]  Fax:  [phone]  ? ?Please advise pharmacist.  ?

## 2021-04-07 NOTE — Telephone Encounter (Signed)
Refill sent 02/08/21, #90, # refills ? ?Spoke with [facility] and they do have refills on file. Nothing further needed at this time.  ? ?

## 2021-04-15 ENCOUNTER — Other Ambulatory Visit: Payer: Self-pay | Admitting: Family Medicine

## 2021-06-12 ENCOUNTER — Ambulatory Visit: Payer: Medicare HMO | Admitting: Family Medicine

## 2021-06-15 ENCOUNTER — Ambulatory Visit: Payer: Medicare HMO | Admitting: Family Medicine

## 2021-06-22 ENCOUNTER — Ambulatory Visit: Payer: Medicare HMO | Admitting: Family Medicine

## 2021-06-28 ENCOUNTER — Other Ambulatory Visit: Payer: Self-pay | Admitting: Family Medicine

## 2021-06-29 NOTE — Telephone Encounter (Signed)
Requested Prescriptions  Pending Prescriptions Disp Refills  . ACCU-CHEK GUIDE test strip [Pharmacy Med Name: ACCU-CHEK GUIDE   Strip] 200 strip 3    Sig: TEST BLOOD SUGAR TWICE DAILY AS DIRECTED     Endocrinology: Diabetes - Testing Supplies Passed - 06/28/2021  8:01 PM      Passed - Valid encounter within last 12 months    Recent Outpatient Visits          5 months ago Need for immunization against influenza   [place] Susy Frizzle, MD   1 year ago Chronic cough   [place] Susy Frizzle, MD   1 year ago Diabetes mellitus without complication [facility])   [place] Susy Frizzle, MD   2 years ago Other elevated white blood cell (WBC) count   [place] Dennard Schaumann, Cammie Mcgee, MD   2 years ago Viral upper respiratory tract infection   [place], [place], FNP

## 2021-07-20 ENCOUNTER — Ambulatory Visit (INDEPENDENT_AMBULATORY_CARE_PROVIDER_SITE_OTHER): Payer: Medicare HMO | Admitting: Family Medicine

## 2021-07-20 VITALS — BP 122/90 | HR 81 | Temp 98.4°F | Ht 65.0 in | Wt 207.0 lb

## 2021-07-20 DIAGNOSIS — G8929 Other chronic pain: Secondary | ICD-10-CM

## 2021-07-20 DIAGNOSIS — Z1231 Encounter for screening mammogram for malignant neoplasm of breast: Secondary | ICD-10-CM | POA: Diagnosis not present

## 2021-07-20 DIAGNOSIS — M25562 Pain in left knee: Secondary | ICD-10-CM

## 2021-07-20 DIAGNOSIS — M25551 Pain in right hip: Secondary | ICD-10-CM | POA: Diagnosis not present

## 2021-07-20 DIAGNOSIS — M25561 Pain in right knee: Secondary | ICD-10-CM | POA: Diagnosis not present

## 2021-07-20 DIAGNOSIS — M25552 Pain in left hip: Secondary | ICD-10-CM | POA: Diagnosis not present

## 2021-07-20 DIAGNOSIS — E119 Type 2 diabetes mellitus without complications: Secondary | ICD-10-CM

## 2021-07-20 MED ORDER — CELECOXIB 200 MG PO CAPS: 200.0000 mg | ORAL_CAPSULE | Freq: Every day | ORAL | 3 refills | Status: DC

## 2021-07-20 NOTE — Progress Notes (Signed)
Subjective:    Patient ID: [name], female    DOB: [birth year], 76 y.o.   MRN: [id number]  Knee Pain   01/02/21 Patient is here today requesting pain medication.  When I asked her to be specific, she states that she hurts all over her body.  I then asked her to try to pinpoint this exact location of her pain.  She states that she hurts in both hips posteriorly.  She hurts in both knees.  She hurts in both of her feet primarily her ankles.  She saw orthopedics.  She had x-rays obtained of both knees which showed moderate arthritis in the left and severe arthritis in the medial compartment on the right.  Orthopedics performed cortisone injections that helped temporarily.  However the patient states that she does not want to have to drive all the way to [place] cortisone shots.  "She wants them fixed".  She also saw podiatry who stated that there was nothing they can do to help her ankle other than to fuse the ankle due to arthritis between the tibia and talus.  She is not taking the Naprosyn prescribed by orthopedics because it upsets her stomach.  She also complains of burning posterior hip pain which is alleviated somewhat by gabapentin.  This is likely due to her degenerative disc disease and back.  For all of these reasons, she is very sedentary.  She states that her son is mad at her because he will not get up and try doing anything to get healthier such as exercise or walk.  Her son is concerned that she is going to continue to decline in health due to her sedentary nature.  I told the patient that I agree with her son's concerns.  Sedentary lifestyle could lead to weakening of the muscles in her hips and legs and ultimately inability to perform activities of daily living.  However the patient states she hurts too much to get up and walk.  Therefore I spent our entire visit on trying to focus on ways to improve her pain.  At that time, my plan was:  Patient is not currently taking any  NSAID.  Recommended starting Celebrex 200 mg daily to try to help with her multifactorial pain including her arthritis in her knees and ankle and degenerative disc disease.  Also performed cortisone shots in both knees.  Using sterile technique, I injected the right knee with 2 cc lidocaine, 2 cc of Marcaine, and 2 cc of 40 mg/mL Kenalog.  She tolerated this procedure well without complication.  I then injected the left knee using the same technique and medication.  She tolerated this as well.  Also recommended physical therapy with patient is resistant to physical therapy.  I encouraged physical therapy to try to prevent deconditioning.  If the patient changes her mind I will be glad to consult physical therapy moving forward.  07/20/21 Patient presents today requesting cortisone shots in both of her knees.  She states that I have to do something about the pain all over the body.  She states that her syndrome feels like she should see a doctor closer to home who "knows what he is doing".  Patient is not taking Celebrex.  She never got the prescription filled.  She also never went to physical therapy but she declined last time.  She states the cortisone shots helped for a few months and.  She had an MRI of her lower back performed in 2021.  Results  are included below IMPRESSION: 1. Interval development of severe degenerative disc disease at T12-L1, L1-2 and L2-3. Small disc extrusion at L2-3 inferior and to the left might affect the left L3 nerve. 2. Interval solid interbody fusion and posterior decompression at L4-5 and L5-S1. 3. Slight progression of degenerative disc disease at L3-4 without focal neural impingement. She denies numbness or tingling in her legs.  She does complain of bilateral hip pain with standing and walking.   Apparently she was getting cortisone injections in her hip from her previous orthopedist in [place].  She is no longer seeing.  Past Medical History:  Diagnosis Date    Allergy    Anxiety    Arthritis    Asthma    Bursitis    Cataract    COPD (chronic obstructive pulmonary disease) (HCC)    Depression    Hiatal hernia    Hypercholesteremia    Hypertension    Type 2 diabetes mellitus ([place])    Ulcer    Past Surgical History:  Procedure Laterality Date   ABDOMINAL HYSTERECTOMY     ANKLE ARTHROSCOPY WITH DRILLING/MICROFRACTURE Left 10/12/2015   Procedure: LEFT ANKLE ARTHROSCOPY, REMOVAL OF OSTEOCHONDRAL LESION WITH MICROFRACTURE;  Surgeon: Trula Slade, DPM;  Location: [place];  Service: Podiatry;  Laterality: Left;   BIOPSY  10/24/2016   Procedure: BIOPSY;  Surgeon: Daneil Dolin, MD;  [facility];  Service: Endoscopy;;  gastric esophageal   CATARACT EXTRACTION [facility]  10/16/2010   Procedure: CATARACT EXTRACTION PHACO AND INTRAOCULAR LENS PLACEMENT ([place]);  Surgeon: Tonny Branch;  Location: [facility];  Service: Ophthalmology;  Laterality: Left;  CDE: 6.99   CATARACT EXTRACTION W/PHACO  12/18/2010   Procedure: CATARACT EXTRACTION PHACO AND INTRAOCULAR LENS PLACEMENT ([facility]);  Surgeon: Tonny Branch;  Location: [facility];  Service: Ophthalmology;  Laterality: Right;  CDE 12.50   COLONOSCOPY     about 5 years ago/[facility]   COLONOSCOPY WITH PROPOFOL N/A 10/24/2016   Procedure: COLONOSCOPY WITH PROPOFOL;  Surgeon: Daneil Dolin, MD;  [facility];  Service: Endoscopy;  Laterality: N/A;  215   ESOPHAGOGASTRODUODENOSCOPY  2003   Gastritis, path unavailable.   ESOPHAGOGASTRODUODENOSCOPY (EGD) WITH PROPOFOL N/A 10/24/2016   Procedure: ESOPHAGOGASTRODUODENOSCOPY (EGD) WITH PROPOFOL;  Surgeon: Daneil Dolin, MD;  [facility];  Service: Endoscopy;  Laterality: N/A;   EYE SURGERY     fracture lower back     left elbow tendon repair     NEUROPLASTY / TRANSPOSITION MEDIAN NERVE AT CARPAL TUNNEL BILATERAL     plantar fasc bilateral     right rotator cuff     rt foot reconstruction     trigger thumb     Left    Current Outpatient Medications on File Prior to Visit  Medication Sig Dispense Refill   ACCU-CHEK GUIDE test strip TEST BLOOD SUGAR TWICE DAILY AS DIRECTED 200 strip 3   Accu-Chek Softclix Lancets lancets Use as directed to monitor FSBS 2x daily. Dx: E11.9 200 each 3   albuterol (PROAIR HFA) 108 (90 Base) MCG/ACT inhaler Inhale 1-2 puffs into the lungs every 6 (six) hours as needed for wheezing or shortness of breath. 6.7 g 1   Alcohol Swabs (DROPSAFE ALCOHOL PREP) 70 % PADS USE AS DIRECTED TO MONITOR FINGERSTICK BLOOD SUGAR TWICE DAILY 200 each 0   atorvastatin (LIPITOR) 80 MG tablet TAKE 1 TABLET EVERY DAY 90 tablet 1   Blood Glucose Calibration (ACCU-CHEK AVIVA) SOLN USE FOR GLUCOMETER  COLLABORATION 1 each 0   Blood Glucose Monitoring Suppl (ACCU-CHEK AVIVA PLUS) w/Device KIT Use as directed to monitor FSBS 2x daily. Dx: E11.9 1 kit 1   budesonide-formoterol (SYMBICORT) 160-4.5 MCG/ACT inhaler Inhale 2 puffs into the lungs 2 (two) times daily. 3 each 3   celecoxib (CELEBREX) 200 MG capsule Take 1 capsule (200 mg total) by mouth daily. 30 capsule 3   gabapentin (NEURONTIN) 300 MG capsule TAKE 1 CAPSULE THREE TIMES DAILY 90 capsule 3   levocetirizine (XYZAL) 5 MG tablet TAKE 1 TABLET EVERY EVENING 90 tablet 3   metoprolol succinate (TOPROL-XL) 25 MG 24 hr tablet TAKE 1 TABLET EVERY DAY 90 tablet 3   montelukast (SINGULAIR) 10 MG tablet TAKE 1 TABLET (10 MG TOTAL) BY MOUTH AT BEDTIME. 90 tablet 3   pioglitazone (ACTOS) 15 MG tablet TAKE 1 TABLET EVERY DAY 90 tablet 3   Polyethyl Glycol-Propyl Glycol (SYSTANE OP) Apply 1 drop to eye 3 (three) times daily.     traZODone (DESYREL) 50 MG tablet TAKE 3 TABLETS BY MOUTH AT BEDTIME AS NEEDED FOR SLEEP. 270 tablet 2   venlafaxine XR (EFFEXOR-XR) 75 MG 24 hr capsule TAKE 2 CAPSULES EVERY DAY 180 capsule 1   No current facility-administered medications on file prior to visit.   Allergies  Allergen Reactions   Codeine Itching   Flonase [Fluticasone  Propionate] Cough    Severe coughing   Metformin And Related Diarrhea    Caused severe diarrhea and severe GI symptoms---even on low dose 589m BID   Social History   Socioeconomic History   Marital status: Single    Spouse name: Not on file   Number of children: 2   Years of education: Not on file   Highest education level: Not on file  Occupational History   Not on file  Tobacco Use   Smoking status: Some Days    Packs/day: 0.50    Years: 52.00    Total pack years: 26.00    Types: Cigarettes   Smokeless tobacco: Never   Tobacco comments:    10/20/20-declines low dose CT scan  Vaping Use   Vaping Use: Never used  Substance and Sexual Activity   Alcohol use: Yes    Comment: rare   Drug use: Yes    Frequency: 3.0 times per week    Types: Marijuana    Comment: not in a few months    Sexual activity: Not Currently    Birth control/protection: Surgical  Other Topics Concern   Not on file  Social History Narrative   2 sons, 1 son lives with patient.   2 boys 3 girls grandchildren, 3 great grandchildren.   Social Determinants of Health   Financial Resource Strain: Low Risk  (10/20/2020)   Overall Financial Resource Strain (CARDIA)    Difficulty of Paying Living Expenses: Not hard at all  Food Insecurity: No Food Insecurity (10/20/2020)   Hunger Vital Sign    Worried About Running Out of Food in the Last Year: Never true    Ran Out of Food in the Last Year: Never true  Transportation Needs: No Transportation Needs (10/20/2020)   PRAPARE - THydrologist(Medical): No    Lack of Transportation (Non-Medical): No  Physical Activity: Insufficiently Active (10/20/2020)   Exercise Vital Sign    Days of Exercise per Week: 3 days    Minutes of Exercise per Session: 30 min  Stress: No Stress Concern Present (10/20/2020)   [facility]    Feeling of Stress : Not at all  Social Connections:  Socially Isolated (10/20/2020)   Social Connection and Isolation Panel [NHANES]    Frequency of Communication with Friends and Family: More than three times a week    Frequency of Social Gatherings with Friends and Family: More than three times a week    Attends Religious Services: Never    Marine scientist or Organizations: No    Attends Archivist Meetings: Never    Marital Status: Divorced  Human resources officer Violence: Not At Risk (10/20/2020)   Humiliation, Afraid, Rape, and Kick questionnaire    Fear of Current or Ex-Partner: No    Emotionally Abused: No    Physically Abused: No    Sexually Abused: No      Review of Systems  All other systems reviewed and are negative.      Objective:   Physical Exam Vitals reviewed.  Constitutional:      General: She is not in acute distress.    Appearance: Normal appearance. She is obese. She is not ill-appearing or toxic-appearing.  HENT:     Right Ear: Tympanic membrane and ear canal normal.     Left Ear: Tympanic membrane and ear canal normal.     Nose: Nose normal.  Cardiovascular:     Rate and Rhythm: Normal rate and regular rhythm.     Heart sounds: Murmur heard.  Pulmonary:     Effort: Pulmonary effort is normal. No respiratory distress.     Breath sounds: No stridor. Wheezing present. No rhonchi or rales.  Musculoskeletal:     Right hip: No bony tenderness. Decreased range of motion.     Left hip: No bony tenderness. Decreased range of motion.     Right knee: Decreased range of motion. Tenderness present over the medial joint line.     Left knee: Decreased range of motion. Tenderness present over the medial joint line.     Right lower leg: No edema.     Left lower leg: No edema.     Right ankle: Decreased range of motion.     Left ankle: Swelling present. Tenderness present. Decreased range of motion.  Lymphadenopathy:     Cervical: No cervical adenopathy.  Neurological:     Mental Status: She is alert.            Assessment & Plan:  Diabetes mellitus without complication ([place]) - Plan: Hemoglobin A1c, CBC with Differential/Platelet, Lipid panel, Microalbumin, urine, COMPLETE METABOLIC PANEL WITH GFR  Bilateral hip pain - Plan: DG Hip Unilat W OR W/O Pelvis 1V Right, DG Hip Unilat W OR W/O Pelvis 1V Left  Encounter for screening mammogram for malignant neoplasm of breast - Plan: MM Digital Screening  Chronic pain of both knees Patient reports diffuse body pain.  I recommended that she start by taking an NSAID that I prescribed at the last visit Celebrex 200 mg daily in addition to the gabapentin 3 times a day.  Using sterile technique I injected both knees with the most of his lidocaine, 2 cc of Marcaine, and 2 cc of 40 mg per mill Kenalog.  She tolerated both injections without complication.  Hopefully this will help her knee pain.  Regarding her hip and low back pain, the question is whether this is due to lumbar radiculopathy and hip pain.  Based on the previous MRI I suspect hip pain secondary to osteoarthritis.  Obtain  x-rays of both hips today and if osteoarthritis is centered in the hips I would recommend referral back to orthopedics for cortisone injections to try to improve functional mobility.  All the patient is here, she is overdue for lab work.  I will check a CBC CMP lipid panel and.  She is also due for mammogram.  Blood pressure is acceptable.

## 2021-07-21 LAB — CBC WITH DIFFERENTIAL/PLATELET
Absolute Monocytes: 549 {cells}/uL (ref 200–950)
Basophils Absolute: 61 {cells}/uL (ref 0–200)
Basophils Relative: 0.5 %
Eosinophils Absolute: 134 {cells}/uL (ref 15–500)
Eosinophils Relative: 1.1 %
HCT: 37.6 % (ref 35.0–45.0)
Hemoglobin: 12.6 g/dL (ref 11.7–15.5)
Lymphs Abs: 3660 {cells}/uL (ref 850–3900)
MCH: 29.2 pg (ref 27.0–33.0)
MCHC: 33.5 g/dL (ref 32.0–36.0)
MCV: 87.2 fL (ref 80.0–100.0)
MPV: 11.1 fL (ref 7.5–12.5)
Monocytes Relative: 4.5 %
Neutro Abs: 7796 {cells}/uL (ref 1500–7800)
Neutrophils Relative %: 63.9 %
Platelets: 322 10*3/uL (ref 140–400)
RBC: 4.31 Million/uL (ref 3.80–5.10)
RDW: 13.1 % (ref 11.0–15.0)
Total Lymphocyte: 30 %
WBC: 12.2 10*3/uL — ABNORMAL HIGH (ref 3.8–10.8)

## 2021-07-21 LAB — COMPLETE METABOLIC PANEL WITHOUT GFR
AG Ratio: 1.4 (calc) (ref 1.0–2.5)
ALT: 16 U/L (ref 6–29)
AST: 20 U/L (ref 10–35)
Albumin: 4 g/dL (ref 3.6–5.1)
Alkaline phosphatase (APISO): 135 U/L (ref 37–153)
BUN/Creatinine Ratio: 14 (calc) (ref 6–22)
BUN: 15 mg/dL (ref 7–25)
CO2: 29 mmol/L (ref 20–32)
Calcium: 9.3 mg/dL (ref 8.6–10.4)
Chloride: 102 mmol/L (ref 98–110)
Creat: 1.07 mg/dL — ABNORMAL HIGH (ref 0.60–1.00)
Globulin: 2.9 g/dL (ref 1.9–3.7)
Glucose, Bld: 97 mg/dL (ref 65–99)
Potassium: 5 mmol/L (ref 3.5–5.3)
Sodium: 141 mmol/L (ref 135–146)
Total Bilirubin: 0.3 mg/dL (ref 0.2–1.2)
Total Protein: 6.9 g/dL (ref 6.1–8.1)
eGFR: 55 mL/min/{1.73_m2} — ABNORMAL LOW

## 2021-07-21 LAB — LIPID PANEL
Cholesterol: 136 mg/dL (ref <200)
HDL: 48 mg/dL — ABNORMAL LOW (ref >=50)
LDL Cholesterol (Calc): 67 mg/dL (calc)
Non-HDL Cholesterol (Calc): 88 mg/dL (calc) (ref <130)
Total CHOL/HDL Ratio: 2.8 (calc) (ref <5.0)
Triglycerides: 119 mg/dL (ref <150)

## 2021-07-21 LAB — MICROALBUMIN, URINE: Microalb, Ur: 2.1 mg/dL

## 2021-07-21 LAB — HEMOGLOBIN A1C
Hgb A1c MFr Bld: 6.1 % of total Hgb — ABNORMAL HIGH (ref <5.7)
Mean Plasma Glucose: 128 mg/dL
eAG (mmol/L): 7.1 mmol/L

## 2021-07-22 ENCOUNTER — Other Ambulatory Visit: Payer: Self-pay | Admitting: Family Medicine

## 2021-07-24 NOTE — Telephone Encounter (Signed)
Requested Prescriptions  Pending Prescriptions Disp Refills  . albuterol (VENTOLIN HFA) 108 (90 Base) MCG/ACT inhaler [Pharmacy Med Name: ALBUTEROL SULFATE HFA 108 (90 Base) MCG/ACT Aerosol Solution] 1 each 0    Sig: INHALE 1 TO 2 PUFFS EVERY 6 HOURS AS NEEDED FOR WHEEZING OR FOR SHORTNESS OF BREATH     Pulmonology:  Beta Agonists 2 Failed - 07/22/2021 10:17 AM      Failed - Last BP in normal range    BP Readings from Last 1 Encounters:  07/20/21 122/90         Passed - Last Heart Rate in normal range    Pulse Readings from Last 1 Encounters:  07/20/21 81         Passed - Valid encounter within last 12 months    Recent Outpatient Visits          6 months ago Need for immunization against influenza   [place] Susy Frizzle, MD   1 year ago Chronic cough   [place] Susy Frizzle, MD   1 year ago Diabetes mellitus without complication [facility])   [place] Susy Frizzle, MD   2 years ago Other elevated white blood cell (WBC) count   [name], Angela Mcgee, MD   2 years ago Viral upper respiratory tract infection   [place], [place], FNP

## 2021-07-28 NOTE — Progress Notes (Deleted)
Chronic Care Management Pharmacy Note  07/28/2021 Name:  [name] MRN:  [id number] DOB:  [birth year]  Subjective: [name] is an 76 y.o. year old female who is a primary patient of Pickard, Cammie Mcgee, MD.  The CCM team was consulted for assistance with disease management and care coordination needs.    Engaged with patient by telephone for follow up visit in response to provider referral for pharmacy case management and/or care coordination services.   Consent to Services:  The patient was given the following information about Chronic Care Management services today, agreed to services, and gave verbal consent: 1. CCM service includes personalized support from designated clinical staff supervised by the primary care provider, including individualized plan of care and coordination with other care providers 2. 24/7 contact phone numbers for assistance for urgent and routine care needs. 3. Service will only be billed when office clinical staff spend 20 minutes or more in a month to coordinate care. 4. Only one practitioner may furnish and bill the service in a calendar month. 5.The patient may stop CCM services at any time (effective at the end of the month) by phone call to the office staff. 6. The patient will be responsible for cost sharing (co-pay) of up to 20% of the service fee (after annual deductible is met). Patient agreed to services and consent obtained.  Patient Care Team: Susy Frizzle, MD as PCP - General (Family Medicine) Gala Romney Cristopher Estimable, MD as Consulting Physician (Gastroenterology) Edythe Clarity, [facility] as Pharmacist (Pharmacist)  Recent office visits: None since last CCM visit  Recent consult visits: None since last CCM visit on 10/23/19  Hospital visits: None in previous 6 months  Objective:  Lab Results  Component Value Date   CREATININE 1.07 (H) 07/20/2021   BUN 15 07/20/2021   GFRNONAA 60 04/18/2020   GFRAA 69 04/18/2020   NA 141 07/20/2021    K 5.0 07/20/2021   CALCIUM 9.3 07/20/2021   CO2 29 07/20/2021   GLUCOSE 97 07/20/2021    Lab Results  Component Value Date/Time   HGBA1C 6.1 (H) 07/20/2021 10:14 AM   HGBA1C 6.0 (H) 04/18/2020 02:57 PM   HGBA1C 6.1 (H) 12/07/2015 11:51 AM   MICROALBUR 2.1 07/20/2021 10:14 AM   MICROALBUR 1.1 09/07/2019 02:58 PM    Last diabetic Eye exam: No results found for: "HMDIABEYEEXA"  Last diabetic Foot exam: No results found for: "HMDIABFOOTEX"   Lab Results  Component Value Date   CHOL 136 07/20/2021   HDL 48 (L) 07/20/2021   LDLCALC 67 07/20/2021   TRIG 119 07/20/2021   CHOLHDL 2.8 07/20/2021       Latest Ref Rng & Units 07/20/2021   10:14 AM 04/18/2020    2:57 PM 09/07/2019    2:58 PM  Hepatic Function  Total Protein 6.1 - 8.1 g/dL 6.9  7.3  7.0   AST 10 - 35 U/L '20  24  24   ' ALT 6 - 29 U/L '16  17  18   ' Total Bilirubin 0.2 - 1.2 mg/dL 0.3  0.4  0.4     Lab Results  Component Value Date/Time   TSH 1.76 02/28/2015 09:56 AM       Latest Ref Rng & Units 07/20/2021   10:14 AM 04/18/2020    2:57 PM 05/21/2019    2:28 PM  CBC  WBC 3.8 - 10.8 Thousand/uL 12.2  12.7  13.0   Hemoglobin 11.7 - 15.5 g/dL 12.6  12.3  13.3   Hematocrit 35.0 - 45.0 % 37.6  37.7  40.5   Platelets 140 - 400 Thousand/uL 322  326  388     No results found for: "VD25OH"  Clinical ASCVD: No  The 10-year ASCVD risk score (Arnett DK, et al., 2019) is: 38.1%   Values used to calculate the score:     Age: 73 years     Sex: Female     Is Non-Hispanic African American: No     Diabetic: Yes     Tobacco smoker: Yes     Systolic Blood Pressure: 321 mmHg     Is BP treated: Yes     HDL Cholesterol: 48 mg/dL     Total Cholesterol: 136 mg/dL       10/20/2020   12:04 PM 04/18/2020    2:27 PM 01/24/2017   12:00 PM  Depression screen PHQ 2/9  Decreased Interest 0 2 1  Down, Depressed, Hopeless 0 2 2  PHQ - 2 Score 0 4 3  Altered sleeping  3 3  Tired, decreased energy  3 3  Change in appetite  3 3   Feeling bad or failure about yourself   2 2  Trouble concentrating  2 2  Moving slowly or fidgety/restless  0 1  Suicidal thoughts  0 0  PHQ-9 Score  17 17  Difficult doing work/chores  Very difficult Somewhat difficult      Social History   Tobacco Use  Smoking Status Some Days   Packs/day: 0.50   Years: 52.00   Total pack years: 26.00   Types: Cigarettes  Smokeless Tobacco Never  Tobacco Comments   10/20/20-declines low dose CT scan   BP Readings from Last 3 Encounters:  07/20/21 122/90  01/02/21 (!) 148/82  08/02/20 138/67   Pulse Readings from Last 3 Encounters:  07/20/21 81  01/02/21 82  08/02/20 (!) 105   Wt Readings from Last 3 Encounters:  07/20/21 207 lb (93.9 kg)  01/02/21 215 lb (97.5 kg)  10/20/20 215 lb (97.5 kg)   BMI Readings from Last 3 Encounters:  07/20/21 34.45 kg/m  01/02/21 35.78 kg/m  10/20/20 35.78 kg/m    Assessment/Interventions: Review of patient past medical history, allergies, medications, health status, including review of consultants reports, laboratory and other test data, was performed as part of comprehensive evaluation and provision of chronic care management services.   SDOH:  (Social Determinants of Health) assessments and interventions performed: Yes  SDOH Screenings   Alcohol Screen: Low Risk  (10/20/2020)   Alcohol Screen    Last Alcohol Screening Score (AUDIT): 0  Depression (PHQ2-9): Low Risk  (10/20/2020)   Depression (PHQ2-9)    PHQ-2 Score: 0  Financial Resource Strain: Low Risk  (10/20/2020)   Overall Financial Resource Strain (CARDIA)    Difficulty of Paying Living Expenses: Not hard at all  Food Insecurity: No Food Insecurity (10/20/2020)   Hunger Vital Sign    Worried About Running Out of Food in the Last Year: Never true    Ran Out of Food in the Last Year: Never true  Housing: Low Risk  (10/20/2020)   Housing    Last Housing Risk Score: 0  Physical Activity: Insufficiently Active (10/20/2020)   Exercise  Vital Sign    Days of Exercise per Week: 3 days    Minutes of Exercise per Session: 30 min  Social Connections: Socially Isolated (10/20/2020)   Social Connection and Isolation Panel [NHANES]    Frequency of Communication  with Friends and Family: More than three times a week    Frequency of Social Gatherings with Friends and Family: More than three times a week    Attends Religious Services: Never    Marine scientist or Organizations: No    Attends Archivist Meetings: Never    Marital Status: Divorced  Stress: No Stress Concern Present (10/20/2020)   [place]    Feeling of Stress : Not at all  Tobacco Use: High Risk (01/02/2021)   Patient History    Smoking Tobacco Use: Some Days    Smokeless Tobacco Use: Never    Passive Exposure: Not on file  Transportation Needs: No Transportation Needs (10/20/2020)   PRAPARE - Transportation    Lack of Transportation (Medical): No    Lack of Transportation (Non-Medical): No    CCM Care Plan  Allergies  Allergen Reactions   Codeine Itching   Flonase [Fluticasone Propionate] Cough    Severe coughing   Metformin And Related Diarrhea    Caused severe diarrhea and severe GI symptoms---even on low dose 526m BID    Medications Reviewed Today     Reviewed by TColman Cater CMA (Certified Medical Assistant) on 07/20/21 at [phone] Med List Status: <None>   Medication Order Taking? Sig Documenting Provider Last Dose Status Informant  ACCU-CHEK GUIDE test strip [id number] TEST BLOOD SUGAR TWICE DAILY AS DIRECTED PSusy Frizzle MD Taking Active   Accu-Chek Softclix Lancets lancets [id number] Use as directed to monitor FSBS 2x daily. Dx: E11.9 PSusy Frizzle MD Taking Active   albuterol ([facility]) 108 ([id number]) MCG/ACT inhaler [id number] Inhale 1-2 puffs into the lungs every 6 (six) hours as needed for wheezing or shortness of breath. PSusy Frizzle  MD Taking Active   Alcohol Swabs (DROPSAFE ALCOHOL PREP) 70 % PADS [id number] USE AS DIRECTED TO MONITOR FINGERSTICK BLOOD SUGAR TWICE DAILY PSusy Frizzle MD Taking Active   atorvastatin (LIPITOR) 80 MG tablet [id number] TAKE 1 TABLET EVERY DAY PSusy Frizzle MD Taking Active   Blood Glucose Calibration ([place]N [id number] USE FOR GLUCOMETER COLLABORATION PSusy Frizzle MD Taking Active   Blood Glucose Monitoring Suppl (ACCU-CHEK AVIVA PLUS) w/Device KIT [id number] Use as directed to monitor FSBS 2x daily. Dx: E11.9 PSusy Frizzle MD Taking Active   budesonide-formoterol ([facility] 160-4.5 MCG/ACT inhaler [id number] Inhale 2 puffs into the lungs 2 (two) times daily. PSusy Frizzle MD Taking Active   celecoxib (CELEBREX) 200 MG capsule [id number] Take 1 capsule (200 mg total) by mouth daily. PSusy Frizzle MD Taking Active   gabapentin (NEURONTIN) 300 MG capsule [id number] TAKE 1 CAPSULE THREE TIMES DAILY PSusy Frizzle MD Taking Active   levocetirizine (XYZAL) 5 MG tablet [id number] TAKE 1 TABLET EVERY EVENING PSusy Frizzle MD Taking Active   metoprolol succinate (TOPROL-XL) 25 MG 24 hr tablet [id number] TAKE 1 TABLET EVERY DAY PSusy Frizzle MD Taking Active   montelukast (SINGULAIR) 10 MG tablet [id number] TAKE 1 TABLET (10 MG TOTAL) BY MOUTH AT BEDTIME. PSusy Frizzle MD Taking Active   pioglitazone (ACTOS) 15 MG tablet [id number] TAKE 1 TABLET EVERY DAY PSusy Frizzle MD Taking Active   Polyethyl Glycol-Propyl Glycol (SYSTANE OP) [id number] Apply 1 drop to eye 3 (three) times daily. [provider] Taking Active Self  traZODone (DESYREL) 50 MG tablet  [id number] Yes TAKE 3 TABLETS BY MOUTH AT BEDTIME AS NEEDED FOR SLEEP. Susy Frizzle, MD Taking Active   venlafaxine XR (EFFEXOR-XR) 75 MG 24 hr capsule [id number] Yes TAKE 2 CAPSULES EVERY DAY Susy Frizzle, MD Taking Active              Patient Active Problem List   Diagnosis Date Noted   Body mass index (BMI) 35.0-35.9, adult 07/13/2019   Anxiety and depression 58/59/2924   Helicobacter pylori gastritis 01/18/2017   Abdominal pain, epigastric 09/21/2016   RUQ pain 09/21/2016   Hx of adenomatous colonic polyps 09/21/2016   GERD (gastroesophageal reflux disease) 09/21/2016   Chronic kidney disease (CKD), stage III (moderate) ([place]) 12/07/2015   Osteochondral lesion of talar dome 10/21/2015   Panic disorder 03/28/2015   Tobacco use disorder 01/12/2015   Foot pain, bilateral 01/12/2015   COPD (chronic obstructive pulmonary disease) ([place])    Depression    Diabetes mellitus without complication ([place])    Hypercholesteremia    Hypertension    Anxiety 02/27/2013   PERSISTENT DISORDER INITIATING/MAINTAINING SLEEP 11/26/2008   Obstructive sleep apnea 11/26/2008   HYPOXEMIA 11/26/2008   HIP PAIN 03/10/2007   [place] DEGENERATION 03/10/2007   BURSITIS, HIP 03/10/2007    Immunization History  Administered Date(s) Administered   Fluad Quad(high Dose 65+) 12/11/2018, 01/02/2021   Influenza, High Dose Seasonal PF 12/24/2016, 12/09/2017   Influenza,inj,Quad PF,6+ Mos 01/12/2015, 12/07/2015   Influenza-Unspecified 02/27/2013   Pneumococcal Conjugate-13 01/23/2004, 01/12/2015   Tdap 11/30/2008    Conditions to be addressed/monitored:  HTN, COPD, GERD, Diabetes, CKD, Depression/Anxiety, hypercholesterolemia,.  There are no care plans that you recently modified to display for this patient.    Care Gaps: Foot exam Eye Exam Need A1c  Star meds: Atorvastatin 11/19/20 90ds   Medication Assistance:  Symbicort obtained through PAP medication assistance program.  Enrollment ends 01/21/21  Patient's preferred pharmacy is:  [facility], [place] [address] Phone: [phone] Fax: [phone]  [id number] [place], [place] - [id number] [id number]: [phone]: [phone]  Uses pill box? Yes Pt endorses 100% compliance  We discussed: Benefits of medication synchronization, packaging and delivery as well as enhanced pharmacist oversight with [employer]. Patient decided to: Continue current medication management strategy  Care Plan and Follow Up Patient Decision:  Patient agrees to Care Plan and Follow-up.  Plan: The care management team will reach out to the patient again over the next 180 days.  CBeverly Milch PharmD Clinical Pharmacist BJonni SangerFamily Medicine ([phone]  Current Barriers:  Unable to independently afford treatment regimen  Pharmacist Clinical Goal(s):  Patient will verbalize ability to afford treatment regimen maintain control of blood glucose and blood pressure as evidenced by home monitiring  contact provider office for questions/concerns as evidenced notation of same in electronic health record through collaboration with PharmD and provider.   Interventions: 1:1 collaboration with PSusy Frizzle MD regarding development and update of comprehensive plan of care as evidenced by provider attestation and co-signature Inter-disciplinary care team collaboration (see longitudinal plan of care) Comprehensive medication review performed; medication list updated in electronic medical record  Hypertension (BP goal <130/80) -Controlled -Current treatment: Metoprolol XL 260mdaily -Medications previously tried: lisinopril (d/c) -Current home readings: no logs available  -Current exercise habits: minimal lately, patient having pain that limits physical activity, able to do ADLs  with no concern -Reports hypotensive/hypertensive symptoms - dizziness, possible vertigo seems to be unrelated to BP -Educated on BP goals and benefits of medications for prevention of heart attack, stroke and kidney damage; Importance of home blood  pressure monitoring; -Counseled to monitor BP at home once or twice weekly, document, and provide log at future appointments -Recommended to continue current medication  Hyperlipidemia: (LDL goal < 100) -Controlled -Current treatment: Atorvastatin 19m -Medications previously tried: none noted  -Educated on Cholesterol goals;  Benefits of statin for ASCVD risk reduction; Importance of limiting foods high in cholesterol;  -Reviewed most recent lipid panel -Recommended to continue current medication  Diabetes (A1c goal <7%) -Controlled -Current medications: Pioglitazone 153mdaily -Medications previously tried: metformin (GI issues)  -Current home glucose readings fasting glucose: 109-116 post prandial glucose: not available -Denies hypoglycemic/hyperglycemic symptoms -She denies any recent episodes of hypoglycemia -Educated on A1c and blood sugar goals; Prevention and management of hypoglycemic episodes; Benefits of routine self-monitoring of blood sugar; -Counseled to check feet daily and get yearly eye exams -Recommended to continue current medication Consider d/c of pioglitazone pending next A1c.  If A1c < 6.0 she may could trial off pioglitazone to manage hypoglycemia risk.  Update 08/01/20  She has had no lower end blood sugars since our last conversation.  Averages between 90-120.  Continues to take Actos daily.  Recommend she follow up in in about 4 months for follow up A1c with Dr. PiDennard Schaumann Update 02/02/21 Patient is due for updated A1c and diabetic foot/eye exam. Recommended she schedule for physical with PCP sometime after March of this year. Denies any hypoglycemia. Blood sugars are ranging from 120s to 150s per her home reports. No changes at this time - if A1c is low patient again could trial off pioglitazone to see if she can manage with diet/exercise alone.  COPD (Goal: control symptoms and prevent exacerbations) -Controlled -Current treatment  Symbicort  160-4.34m68m-Appropriate, Effective, Safe, Accessible Ventolin HFA [id number] -Appropriate, Effective, Safe, Accessible -Medications previously tried: none noted  -Exacerbations requiring treatment in last 6 months: none -Patient denies consistent use of maintenance inhaler - still only using it once daily due to cost concern.  -Frequency of rescue inhaler use: daily prn -Counseled on [place] inhaler technique; Benefits of consistent maintenance inhaler use Differences between maintenance and rescue inhalers -Recommended to continue current medication Assessed patient finances. Will have Veronica reach out to her and help with process of applying for PAP for Symbicort.  Update 08/01/20 Now receiving Symbicort through patient assistance program.  She reports she is not taking it twice daily consistently.  Not having to use her Ventolin often at all. No changes recommended.  Will need to renew application in late [id number] next year.   Update 02/02/21 She has not received her Symbicort in a while and reports she is out of the medication. [place] [employer] [place].  Completed renewal steps to have medication delivered to patient's home. She was rarely having to use Albuterol HFA while she was taking Symbicort. Denies any shortness of breath unless she over exerts. Stressed importance of staying on Symbicort for maintenance, No changes to meds - work on SymbHCA IncPatient Goals/Self-Care Activities Patient will:  - take medications as prescribed check glucose daily, document, and provide at future appointments check blood pressure a few times per week, document, and provide at future appointments collaborate with provider on medication access solutions.  Follow Up Plan: The care management team  will reach out to the patient again over the next 180 days.

## 2021-08-10 ENCOUNTER — Telehealth: Payer: Self-pay

## 2021-08-14 ENCOUNTER — Other Ambulatory Visit: Payer: Self-pay | Admitting: Family Medicine

## 2021-09-28 ENCOUNTER — Telehealth: Payer: Self-pay | Admitting: Family Medicine

## 2021-09-28 DIAGNOSIS — E119 Type 2 diabetes mellitus without complications: Secondary | ICD-10-CM

## 2021-09-28 DIAGNOSIS — I10 Essential (primary) hypertension: Secondary | ICD-10-CM

## 2021-09-28 NOTE — Telephone Encounter (Signed)
I called patient to check to see if she has seen her eye doctor recently. She said no that she has recently moved to the [place] area and would like [place] to refer her to a provider in that area. Please place a referral for patient.  CB# [phone]

## 2021-09-28 NOTE — Telephone Encounter (Signed)
Referrals place for opthalmology

## 2021-10-25 ENCOUNTER — Ambulatory Visit (INDEPENDENT_AMBULATORY_CARE_PROVIDER_SITE_OTHER): Payer: Medicare HMO

## 2021-10-25 ENCOUNTER — Other Ambulatory Visit: Payer: Self-pay | Admitting: Family Medicine

## 2021-10-25 VITALS — Ht 65.75 in | Wt 207.0 lb

## 2021-10-25 DIAGNOSIS — Z0000 Encounter for general adult medical examination without abnormal findings: Secondary | ICD-10-CM | POA: Diagnosis not present

## 2021-10-25 DIAGNOSIS — M75121 Complete rotator cuff tear or rupture of right shoulder, not specified as traumatic: Secondary | ICD-10-CM | POA: Diagnosis not present

## 2021-10-25 DIAGNOSIS — M25551 Pain in right hip: Secondary | ICD-10-CM | POA: Diagnosis not present

## 2021-10-25 DIAGNOSIS — M1611 Unilateral primary osteoarthritis, right hip: Secondary | ICD-10-CM | POA: Diagnosis not present

## 2021-10-25 DIAGNOSIS — M25561 Pain in right knee: Secondary | ICD-10-CM | POA: Diagnosis not present

## 2021-10-25 NOTE — Progress Notes (Signed)
Subjective:   [name] is a 76 y.o. female who presents for Medicare Annual (Subsequent) preventive examination. Virtual Visit via Telephone Note  I connected with  [name] on 10/25/21 at  9:00 AM EDT by telephone and verified that I am speaking with the correct person using two identifiers.  Location: Patient: HOME Provider: [facility] Persons participating in the virtual visit: patient/Nurse Health Advisor   I discussed the limitations, risks, security and privacy concerns of performing an evaluation and management service by telephone and the availability of in person appointments. The patient expressed understanding and agreed to proceed.  Interactive audio and video telecommunications were attempted between this nurse and patient, however failed, due to patient having technical difficulties OR patient did not have access to video capability.  We continued and completed visit with audio only.  Some vital signs may be absent or patient reported.   [name], LPN  Review of Systems     Cardiac Risk Factors include: advanced age (>33mn, >>3women);diabetes mellitus;dyslipidemia;hypertension;sedentary lifestyle;obesity (BMI >30kg/m2)     Objective:    Today's Vitals   10/25/21 0851 10/25/21 0852 10/25/21 0856  Weight: 207 lb (93.9 kg)    Height: 5' 5.75" (1.67 m)    PainSc:  10-Worst pain ever 10-Worst pain ever   Body mass index is 33.67 kg/m.     10/25/2021    9:04 AM 10/20/2020   12:09 PM 10/24/2016   11:45 AM 10/18/2016   11:16 AM 10/12/2015   11:10 AM 10/06/2015    9:28 AM 12/18/2010    7:43 AM  Advanced Directives  Does Patient Have a Medical Advance Directive? No No No No No No Patient does not have advance directive;Patient would not like information  Would patient like information on creating a medical advance directive? No - Patient declined No - Patient declined  Yes (MAU/Ambulatory/Procedural Areas - Information given) No - patient declined  information    Pre-existing out of facility DNR order (yellow form or pink MOST form)       No    Current Medications (verified) Outpatient Encounter Medications as of 10/25/2021  Medication Sig   ACCU-CHEK GUIDE test strip TEST BLOOD SUGAR TWICE DAILY AS DIRECTED   Accu-Chek Softclix Lancets lancets Use as directed to monitor FSBS 2x daily. Dx: E11.9   albuterol (VENTOLIN HFA) 108 (90 Base) MCG/ACT inhaler INHALE 1 TO 2 PUFFS EVERY 6 HOURS AS NEEDED FOR WHEEZING OR FOR SHORTNESS OF BREATH   Alcohol Swabs (DROPSAFE ALCOHOL PREP) 70 % PADS USE AS DIRECTED TO MONITOR FINGERSTICK BLOOD SUGAR TWICE DAILY   atorvastatin (LIPITOR) 80 MG tablet TAKE 1 TABLET EVERY DAY   Blood Glucose Calibration (ACCU-CHEK AVIVA) SOLN USE FOR GLUCOMETER COLLABORATION   Blood Glucose Monitoring Suppl (ACCU-CHEK AVIVA PLUS) w/Device KIT Use as directed to monitor FSBS 2x daily. Dx: E11.9   budesonide-formoterol (SYMBICORT) 160-4.5 MCG/ACT inhaler Inhale 2 puffs into the lungs 2 (two) times daily.   celecoxib (CELEBREX) 200 MG capsule Take 1 capsule (200 mg total) by mouth daily.   gabapentin (NEURONTIN) 300 MG capsule TAKE 1 CAPSULE THREE TIMES DAILY   levocetirizine (XYZAL) 5 MG tablet TAKE 1 TABLET EVERY EVENING   metoprolol succinate (TOPROL-XL) 25 MG 24 hr tablet TAKE 1 TABLET EVERY DAY   montelukast (SINGULAIR) 10 MG tablet TAKE 1 TABLET (10 MG TOTAL) BY MOUTH AT BEDTIME.   pioglitazone (ACTOS) 15 MG tablet TAKE 1 TABLET EVERY DAY   Polyethyl Glycol-Propyl Glycol (SYSTANE  OP) Apply 1 drop to eye 3 (three) times daily.   traZODone (DESYREL) 50 MG tablet TAKE 3 TABLETS BY MOUTH AT BEDTIME AS NEEDED FOR SLEEP.   venlafaxine XR (EFFEXOR-XR) 75 MG 24 hr capsule TAKE 2 CAPSULES EVERY DAY   No facility-administered encounter medications on file as of 10/25/2021.    Allergies (verified) Codeine, Flonase [fluticasone propionate], and Metformin and related   History: Past Medical History:  Diagnosis Date   Allergy     Anxiety    Arthritis    Asthma    Bursitis    Cataract    COPD (chronic obstructive pulmonary disease) (HCC)    Depression    Hiatal hernia    Hypercholesteremia    Hypertension    Type 2 diabetes mellitus ([place])    Ulcer    Past Surgical History:  Procedure Laterality Date   ABDOMINAL HYSTERECTOMY     ANKLE ARTHROSCOPY WITH DRILLING/MICROFRACTURE Left 10/12/2015   Procedure: LEFT ANKLE ARTHROSCOPY, REMOVAL OF OSTEOCHONDRAL LESION WITH MICROFRACTURE;  Surgeon: Trula Slade, DPM;  Location: [place];  Service: Podiatry;  Laterality: Left;   BIOPSY  10/24/2016   Procedure: BIOPSY;  Surgeon: Daneil Dolin, MD;  [facility];  Service: Endoscopy;;  gastric esophageal   CATARACT EXTRACTION [facility]  10/16/2010   Procedure: CATARACT EXTRACTION PHACO AND INTRAOCULAR LENS PLACEMENT ([place]);  Surgeon: Tonny Branch;  Location: [facility];  Service: Ophthalmology;  Laterality: Left;  CDE: 6.99   CATARACT EXTRACTION W/PHACO  12/18/2010   Procedure: CATARACT EXTRACTION PHACO AND INTRAOCULAR LENS PLACEMENT ([facility]);  Surgeon: Tonny Branch;  Location: [facility];  Service: Ophthalmology;  Laterality: Right;  CDE 12.50   COLONOSCOPY     about 5 years ago/[facility]   COLONOSCOPY WITH PROPOFOL N/A 10/24/2016   Procedure: COLONOSCOPY WITH PROPOFOL;  Surgeon: Daneil Dolin, MD;  [facility];  Service: Endoscopy;  Laterality: N/A;  215   ESOPHAGOGASTRODUODENOSCOPY  2003   Gastritis, path unavailable.   ESOPHAGOGASTRODUODENOSCOPY (EGD) WITH PROPOFOL N/A 10/24/2016   Procedure: ESOPHAGOGASTRODUODENOSCOPY (EGD) WITH PROPOFOL;  Surgeon: Daneil Dolin, MD;  [facility];  Service: Endoscopy;  Laterality: N/A;   EYE SURGERY     fracture lower back     left elbow tendon repair     NEUROPLASTY / TRANSPOSITION MEDIAN NERVE AT CARPAL TUNNEL BILATERAL     plantar fasc bilateral     right rotator cuff     rt foot reconstruction     trigger thumb     Left   Family History   Problem Relation Age of Onset   Breast cancer Daughter    Heart disease Mother    Hyperlipidemia Mother    Hyperlipidemia Father    Cancer Sister    Birth defects Grandchild    Anesthesia problems Neg Hx    Hypotension Neg Hx    Malignant hyperthermia Neg Hx    Pseudochol deficiency Neg Hx    Colon cancer Neg Hx    Social History   Socioeconomic History   Marital status: Single    Spouse name: Not on file   Number of children: 2   Years of education: Not on file   Highest education level: Not on file  Occupational History   Not on file  Tobacco Use   Smoking status: Some Days    Packs/day: 0.50    Years: 52.00    Total pack years: 26.00    Types: Cigarettes   Smokeless  tobacco: Never   Tobacco comments:    10/25/2021-declines low dose CT scan  Vaping Use   Vaping Use: Never used  Substance and Sexual Activity   Alcohol use: Yes    Comment: rare   Drug use: Yes    Frequency: 3.0 times per week    Types: Marijuana    Comment: not in a few months    Sexual activity: Not Currently    Birth control/protection: Surgical  Other Topics Concern   Not on file  Social History Narrative   2 sons, 1 son lives with patient.   2 boys 3 girls grandchildren, 3 great grandchildren.   Social Determinants of Health   Financial Resource Strain: Low Risk  (10/25/2021)   Overall Financial Resource Strain (CARDIA)    Difficulty of Paying Living Expenses: Not hard at all  Food Insecurity: No Food Insecurity (10/25/2021)   Hunger Vital Sign    Worried About Running Out of Food in the Last Year: Never true    Ran Out of Food in the Last Year: Never true  Transportation Needs: No Transportation Needs (10/25/2021)   PRAPARE - Hydrologist (Medical): No    Lack of Transportation (Non-Medical): No  Physical Activity: Inactive (10/25/2021)   Exercise Vital Sign    Days of Exercise per Week: 0 days    Minutes of Exercise per Session: 0 min  Stress: No Stress  Concern Present (10/25/2021)   [place]    Feeling of Stress : Not at all  Social Connections: Socially Isolated (10/25/2021)   Social Connection and Isolation Panel [NHANES]    Frequency of Communication with Friends and Family: More than three times a week    Frequency of Social Gatherings with Friends and Family: More than three times a week    Attends Religious Services: Never    Marine scientist or Organizations: No    Attends Archivist Meetings: Never    Marital Status: Widowed    Tobacco Counseling Ready to quit: Not Answered Counseling given: Not Answered Tobacco comments: 10/25/2021-declines low dose CT scan   Clinical Intake:  Pre-visit preparation completed: Yes  Pain : 0-10 Pain Score: 10-Worst pain ever Pain Type: Chronic pain Pain Location: Hip Pain Descriptors / Indicators: Aching, Discomfort Pain Onset: 1 to 4 weeks ago Pain Frequency: Intermittent     BMI - recorded: 33.67 Nutritional Status: BMI > 30  Obese Nutritional Risks: None Diabetes: Yes  How often do you need to have someone help you when you read instructions, pamphlets, or other written materials from your doctor or pharmacy?: 1 - Never  Diabetic?Nutrition Risk Assessment:  Has the patient had any N/V/D within the last 2 months?  No  Does the patient have any non-healing wounds?  No  Has the patient had any unintentional weight loss or weight gain?  No   Diabetes:  Is the patient diabetic?  Yes  If diabetic, was a CBG obtained today?  No  Did the patient bring in their glucometer from home?  No  How often do you monitor your CBG's? daily.   Financial Strains and Diabetes Management:  Are you having any financial strains with the device, your supplies or your medication? No .  Does the patient want to be seen by Chronic Care Management for management of their diabetes?  No  Would the patient like to be  referred to a Nutritionist or for  Diabetic Management?  No   Diabetic Exams:  Diabetic Eye Exam: Completed scheduled for 12/20/2021. Overdue for diabetic eye exam. Pt has been advised about the importance in completing this exam.  Diabetic Foot Exam: Completed 8/16/20211. Pt has been advised about the importance in completing this exam.  Interpreter Needed?: No  Information entered by :: mj Kearney Evitt, lpn   Activities of Daily Living    10/25/2021    9:04 AM  In your present state of health, do you have any difficulty performing the following activities:  Hearing? 0  Vision? 0  Difficulty concentrating or making decisions? 0  Walking or climbing stairs? 0  Dressing or bathing? 0  Doing errands, shopping? 0  Preparing Food and eating ? N  Using the Toilet? N  In the past six months, have you accidently leaked urine? Y  Do you have problems with loss of bowel control? N  Managing your Medications? N  Managing your Finances? N  Housekeeping or managing your Housekeeping? N    Patient Care Team: Susy Frizzle, MD as PCP - General (Family Medicine) Gala Romney Cristopher Estimable, MD as Consulting Physician (Gastroenterology) Edythe Clarity, [facility] as Pharmacist (Pharmacist)  Indicate any recent Medical Services you may have received from other than [facility] providers in the past year (date may be approximate).     Assessment:   This is a routine wellness examination for Mychal.  Hearing/Vision screen No results found.  Dietary issues and exercise activities discussed: Current Exercise Habits: The patient does not participate in regular exercise at present, Exercise limited by: cardiac condition(s);orthopedic condition(s)   Goals Addressed             This Visit's Progress    Exercise 3x per week (30 min per time)   Not on track    Increase exercise.      Track and Manage My Symptoms-COPD   On track    Timeframe:  Long-Range Goal Priority:  High Start Date:  04/25/20                            Expected End Date:  10/25/20                     Follow Up Date 07/25/20   - eliminate symptom triggers at home - follow rescue plan if symptoms flare-up - keep follow-up appointments  -Await assistance from CCM team with patient assistance application   Why is this important?   Tracking your symptoms and other information about your health helps your doctor plan your care.  Write down the symptoms, the time of day, what you were doing and what medicine you are taking.  You will soon learn how to manage your symptoms.     Notes: Wants to take twice daily, cost is issue.       Depression Screen    10/25/2021    9:01 AM 10/20/2020   12:04 PM 04/18/2020    2:27 PM 01/24/2017   12:00 PM 03/23/2016   10:49 AM 01/12/2015   11:28 AM  PHQ 2/9 Scores  PHQ - 2 Score 0 0 '4 3 3 1  ' PHQ- 9 Score   '17 17 6     ' Fall Risk    10/25/2021    9:04 AM 10/20/2020   12:10 PM 04/18/2020    2:27 PM 01/24/2017   12:00 PM 03/23/2016   10:49 AM  Fall Risk  Falls in the past year? 0 0 0 No No  Number falls in past yr: 0 0     Injury with Fall? 0 0     Risk for fall due to : Impaired balance/gait;Impaired mobility Impaired vision;Impaired balance/gait No Fall Risks    Follow up Falls prevention discussed Falls evaluation completed Falls evaluation completed      [place]:  Any stairs in or around the home? Yes  If so, are there any without handrails? No  Home free of loose throw rugs in walkways, pet beds, electrical cords, etc? Yes  Adequate lighting in your home to reduce risk of falls? Yes   ASSISTIVE DEVICES UTILIZED TO PREVENT FALLS:  Life alert? No  Use of a cane, walker or w/c? Yes  Grab bars in the bathroom? No  Shower chair or bench in shower? No  Elevated toilet seat or a handicapped toilet? No   TIMED UP AND GO:  Was the test performed? No .  PHONE VISIT. Cognitive Function:        10/25/2021    9:05 AM 10/20/2020   12:15 PM   6CIT Screen  What Year? 0 points 0 points  What month? 0 points 0 points  What time? 0 points 0 points  Count back from 20 0 points 0 points  Months in reverse 0 points 0 points  Repeat phrase 0 points 0 points  Total Score 0 points 0 points    Immunizations Immunization History  Administered Date(s) Administered   Fluad Quad(high Dose 65+) 12/11/2018, 01/02/2021   Influenza, High Dose Seasonal PF 12/24/2016, 12/09/2017   Influenza,inj,Quad PF,6+ Mos 01/12/2015, 12/07/2015   Influenza-Unspecified 02/27/2013   Pneumococcal Conjugate-13 01/23/2004, 01/12/2015   Tdap 11/30/2008    TDAP status: Due, Education has been provided regarding the importance of this vaccine. Advised may receive this vaccine at local pharmacy or Health Dept. Aware to provide a copy of the vaccination record if obtained from local pharmacy or Health Dept. Verbalized acceptance and understanding.  Flu Vaccine status: Due, Education has been provided regarding the importance of this vaccine. Advised may receive this vaccine at local pharmacy or Health Dept. Aware to provide a copy of the vaccination record if obtained from local pharmacy or Health Dept. Verbalized acceptance and understanding.  Pneumococcal vaccine status: Due, Education has been provided regarding the importance of this vaccine. Advised may receive this vaccine at local pharmacy or Health Dept. Aware to provide a copy of the vaccination record if obtained from local pharmacy or Health Dept. Verbalized acceptance and understanding.  Covid-19 vaccine status: Information provided on how to obtain vaccines.   Qualifies for Shingles Vaccine? Yes   Zostavax completed No   Shingrix Completed?: No.    Education has been provided regarding the importance of this vaccine. Patient has been advised to call insurance company to determine out of pocket expense if they have not yet received this vaccine. Advised may also receive vaccine at local pharmacy or  Health Dept. Verbalized acceptance and understanding.  Screening Tests Health Maintenance  Topic Date Due   OPHTHALMOLOGY EXAM  09/20/2020   Pneumonia Vaccine 45+ Years old (2 - PPSV23 or PCV20) 11/03/2021 (Originally 03/09/2015)   INFLUENZA VACCINE  11/03/2021 (Originally 08/22/2021)   TETANUS/TDAP  12/20/2021 (Originally 12/01/2018)   COVID-19 Vaccine (1) 01/21/2022 (Originally 05/22/1946)   Diabetic kidney evaluation - Urine ACR  10/25/2022 (Originally 11/22/1963)   Zoster Vaccines- Shingrix (1 of 2) 10/25/2022 (Originally 11/22/1997)  FOOT EXAM  10/26/2022 (Originally 09/06/2020)   COLONOSCOPY (Pts 45-6yr Insurance coverage will need to be confirmed)  10/26/2022 (Originally 10/24/2021)   HEMOGLOBIN A1C  01/19/2022   MAMMOGRAM  06/25/2022   Diabetic kidney evaluation - GFR measurement  07/21/2022   DEXA SCAN  Completed   Hepatitis C Screening  Completed   HPV VACCINES  Aged Out    Health Maintenance  Health Maintenance Due  Topic Date Due   OPHTHALMOLOGY EXAM  09/20/2020    Colorectal cancer screening: Type of screening: Colonoscopy. Completed 10/23/2016. Repeat every 5 years  Mammogram status: Completed 06/24/2020. Repeat every year  Bone Density status: Completed 09/12/2015. Results reflect: Bone density results: NORMAL. Repeat every 2 years.  Lung Cancer Screening: (Low Dose CT Chest recommended if Age 74-80years, 30 pack-year currently smoking OR have quit w/in 15years.) does qualify.   Lung Cancer Screening Referral: Pt declined.  Additional Screening:  Hepatitis C Screening: does qualify; Completed 02/28/2015  Vision Screening: Recommended annual ophthalmology exams for early detection of glaucoma and other disorders of the eye. Is the patient up to date with their annual eye exam?  No  Who is the provider or what is the name of the office in which the patient attends annual eye exams? [facility] provider, would they  like to be referred to a provider to establish care? No .   Dental Screening: Recommended annual dental exams for proper oral hygiene  Community Resource Referral / Chronic Care Management: CRR required this visit?  No   CCM required this visit?  No      Plan:     I have personally reviewed and noted the following in the patient's chart:   Medical and social history Use of alcohol, tobacco or illicit drugs  Current medications and supplements including opioid prescriptions. Patient is not currently taking opioid prescriptions. Functional ability and status Nutritional status Physical activity Advanced directives List of other physicians Hospitalizations, surgeries, and ER visits in previous 12 months Vitals Screenings to include cognitive, depression, and falls Referrals and appointments  In addition, I have reviewed and discussed with patient certain preventive protocols, quality metrics, and best practice recommendations. A written personalized care plan for preventive services as well as general preventive health recommendations were provided to patient.     MChriss Driver LPN   [id number]  Nurse Notes: Pt has Diabetic Retinopathy exam scheduled for 12/20/21 @ 2 pm. Pt has flu vaccine and Prevnar 20 scheduled for 11/30/2021.

## 2021-10-25 NOTE — Patient Instructions (Signed)
[name] , Thank you for taking time to come for your Medicare Wellness Visit. I appreciate your ongoing commitment to your health goals. Please review the following plan we discussed and let me know if I can assist you in the future.   These are the goals we discussed:  Goals      Exercise 3x per week (30 min per time)     Increase exercise.      Pharmacy Care Plan:     CARE PLAN ENTRY (see longitudinal plan of care for additional care plan information)  Current Barriers:  Chronic Disease Management support, education, and care coordination needs related to Hypertension, Hyperlipidemia, Diabetes, and COPD   Hypertension BP Readings from Last 3 Encounters:  09/07/19 130/68  05/21/19 128/68  03/03/19 130/66  Pharmacist Clinical Goal(s): Over the next 180 days, patient will work with PharmD and providers to maintain BP goal <130/80 Current regimen:  Metoprolol XL '25mg'$  Interventions: Reviewed office BP readings Discussed medication adherence Patient self care activities - Over the next 180 days, patient will: Check BP periodically, document, and provide at future appointments Ensure daily salt intake < 2300 mg/day  Hyperlipidemia Lab Results  Component Value Date/Time   LDLCALC 82 09/07/2019 02:58 PM  Pharmacist Clinical Goal(s): Over the next 180 days, patient will work with PharmD and providers to maintain LDL goal < 80 Current regimen:  Atorvastatin '80mg'$  Interventions: Reviewed most recent lipid panel Patient self care activities - Over the next 180 days, patient will: Continue to focus on medication adherence by pill count  Diabetes Lab Results  Component Value Date/Time   HGBA1C 6.0 (H) 09/07/2019 02:58 PM   HGBA1C 6.9 (H) 08/14/2018 09:46 AM   HGBA1C 6.1 (H) 12/07/2015 11:51 AM  Pharmacist Clinical Goal(s): Over the next 180 days, patient will work with PharmD and providers to maintain A1c goal <7% Current regimen:  Pioglitazone '15mg'$   daily Interventions: Reviewed home blood sugar monitoring Discussed signs/sx of hypoglycemia Patient self care activities - Over the next 180 days, patient will: Check blood sugar once daily, document, and provide at future appointments Contact provider with any episodes of hypoglycemia COPD Pharmacist Clinical Goal(s) Over the next 180 days, patient will work with PharmD and providers to optimize medication and minimize symptoms of COPD. Current regimen:  Symbicort 160-4.73mg Ventolin HFA 962m prn Interventions: Discussed cost barrier to proper inhaler use  Will apply for PAP program Patient self care activities - Over the next 180 days, patient will: Continue to focus on medication adherence Contact providers with any sudden change or worsening of symptoms   Initial goal documentation      Track and Manage My Symptoms-COPD     Timeframe:  Long-Range Goal Priority:  High Start Date:  04/25/20                           Expected End Date:  10/25/20                     Follow Up Date 07/25/20   - eliminate symptom triggers at home - follow rescue plan if symptoms flare-up - keep follow-up appointments  -Await assistance from CCM team with patient assistance application   Why is this important?   Tracking your symptoms and other information about your health helps your doctor plan your care.  Write down the symptoms, the time of day, what you were doing and what medicine you are taking.  You  will soon learn how to manage your symptoms.     Notes: Wants to take twice daily, cost is issue.        This is a list of the screening recommended for you and due dates:  Health Maintenance  Topic Date Due   Eye exam for diabetics  09/20/2020   Pneumonia Vaccine (2 - PPSV23 or PCV20) 11/03/2021*   Flu Shot  11/03/2021*   Tetanus Vaccine  12/20/2021*   COVID-19 Vaccine (1) 01/21/2022*   Yearly kidney health urinalysis for diabetes  10/25/2022*   Zoster (Shingles) Vaccine (1  of 2) 10/25/2022*   Complete foot exam   10/26/2022*   Colon Cancer Screening  10/26/2022*   Hemoglobin A1C  01/19/2022   Mammogram  06/25/2022   Yearly kidney function blood test for diabetes  07/21/2022   DEXA scan (bone density measurement)  Completed   Hepatitis C Screening: USPSTF Recommendation to screen - Ages 84-79 yo.  Completed   HPV Vaccine  Aged Out  *Topic was postponed. The date shown is not the original due date.    Advanced directives: Advance directive discussed with you today. Even though you declined this today, please call our office should you change your mind, and we can give you the proper paperwork for you to fill out.   Conditions/risks identified: Aim for 30 minutes of exercise or brisk walking, 6-8 glasses of water, and 5 servings of fruits and vegetables each day.   Next appointment: Follow up in one year for your annual wellness visit 10/2022.   Preventive Care 41 Years and Older, Female Preventive care refers to lifestyle choices and visits with your health care provider that can promote health and wellness. What does preventive care include? A yearly physical exam. This is also called an annual well check. Dental exams once or twice a year. Routine eye exams. Ask your health care provider how often you should have your eyes checked. Personal lifestyle choices, including: Daily care of your teeth and gums. Regular physical activity. Eating a healthy diet. Avoiding tobacco and drug use. Limiting alcohol use. Practicing safe sex. Taking low-dose aspirin every day. Taking vitamin and mineral supplements as recommended by your health care provider. What happens during an annual well check? The services and screenings done by your health care provider during your annual well check will depend on your age, overall health, lifestyle risk factors, and family history of disease. Counseling  Your health care provider may ask you questions about  your: Alcohol use. Tobacco use. Drug use. Emotional well-being. Home and relationship well-being. Sexual activity. Eating habits. History of falls. Memory and ability to understand (cognition). Work and work Statistician. Reproductive health. Screening  You may have the following tests or measurements: Height, weight, and BMI. Blood pressure. Lipid and cholesterol levels. These may be checked every 5 years, or more frequently if you are over 79 years old. Skin check. Lung cancer screening. You may have this screening every year starting at age 58 if you have a 30-pack-year history of smoking and currently smoke or have quit within the past 15 years. Fecal occult blood test (FOBT) of the stool. You may have this test every year starting at age 21. Flexible sigmoidoscopy or colonoscopy. You may have a sigmoidoscopy every 5 years or a colonoscopy every 10 years starting at age 12. Hepatitis C blood test. Hepatitis B blood test. Sexually transmitted disease (STD) testing. Diabetes screening. This is done by checking your blood sugar (glucose) after you have  not eaten for a while (fasting). You may have this done every 1-3 years. Bone density scan. This is done to screen for osteoporosis. You may have this done starting at age 43. Mammogram. This may be done every 1-2 years. Talk to your health care provider about how often you should have regular mammograms. Talk with your health care provider about your test results, treatment options, and if necessary, the need for more tests. Vaccines  Your health care provider may recommend certain vaccines, such as: Influenza vaccine. This is recommended every year. Tetanus, diphtheria, and acellular pertussis (Tdap, Td) vaccine. You may need a Td booster every 10 years. Zoster vaccine. You may need this after age 51. Pneumococcal 13-valent conjugate (PCV13) vaccine. One dose is recommended after age 76. Pneumococcal polysaccharide (PPSV23) vaccine.  One dose is recommended after age 20. Talk to your health care provider about which screenings and vaccines you need and how often you need them. This information is not intended to replace advice given to you by your health care provider. Make sure you discuss any questions you have with your health care provider. Document Released: 02/04/2015 Document Revised: 09/28/2015 Document Reviewed: 11/09/2014 Elsevier Interactive Patient Education  2017 [employer] Prevention in the Home Falls can cause injuries. They can happen to people of all ages. There are many things you can do to make your home safe and to help prevent falls. What can I do on the outside of my home? Regularly fix the edges of walkways and driveways and fix any cracks. Remove anything that might make you trip as you walk through a door, such as a raised step or threshold. Trim any bushes or trees on the path to your home. Use bright outdoor lighting. Clear any walking paths of anything that might make someone trip, such as rocks or tools. Regularly check to see if handrails are loose or broken. Make sure that both sides of any steps have handrails. Any raised decks and porches should have guardrails on the edges. Have any leaves, snow, or ice cleared regularly. Use sand or salt on walking paths during winter. Clean up any spills in your garage right away. This includes oil or grease spills. What can I do in the bathroom? Use night lights. Install grab bars by the toilet and in the tub and shower. Do not use towel bars as grab bars. Use non-skid mats or decals in the tub or shower. If you need to sit down in the shower, use a plastic, non-slip stool. Keep the floor dry. Clean up any water that spills on the floor as soon as it happens. Remove soap buildup in the tub or shower regularly. Attach bath mats securely with double-sided non-slip rug tape. Do not have throw rugs and other things on the floor that can make  you trip. What can I do in the bedroom? Use night lights. Make sure that you have a light by your bed that is easy to reach. Do not use any sheets or blankets that are too big for your bed. They should not hang down onto the floor. Have a firm chair that has side arms. You can use this for support while you get dressed. Do not have throw rugs and other things on the floor that can make you trip. What can I do in the kitchen? Clean up any spills right away. Avoid walking on wet floors. Keep items that you use a lot in easy-to-reach places. If you need to  reach something above you, use a strong step stool that has a grab bar. Keep electrical cords out of the way. Do not use floor polish or wax that makes floors slippery. If you must use wax, use non-skid floor wax. Do not have throw rugs and other things on the floor that can make you trip. What can I do with my stairs? Do not leave any items on the stairs. Make sure that there are handrails on both sides of the stairs and use them. Fix handrails that are broken or loose. Make sure that handrails are as long as the stairways. Check any carpeting to make sure that it is firmly attached to the stairs. Fix any carpet that is loose or worn. Avoid having throw rugs at the top or bottom of the stairs. If you do have throw rugs, attach them to the floor with carpet tape. Make sure that you have a light switch at the top of the stairs and the bottom of the stairs. If you do not have them, ask someone to add them for you. What else can I do to help prevent falls? Wear shoes that: Do not have high heels. Have rubber bottoms. Are comfortable and fit you well. Are closed at the toe. Do not wear sandals. If you use a stepladder: Make sure that it is fully opened. Do not climb a closed stepladder. Make sure that both sides of the stepladder are locked into place. Ask someone to hold it for you, if possible. Clearly mark and make sure that you can  see: Any grab bars or handrails. First and last steps. Where the edge of each step is. Use tools that help you move around (mobility aids) if they are needed. These include: Canes. Walkers. Scooters. Crutches. Turn on the lights when you go into a dark area. Replace any light bulbs as soon as they burn out. Set up your furniture so you have a clear path. Avoid moving your furniture around. If any of your floors are uneven, fix them. If there are any pets around you, be aware of where they are. Review your medicines with your doctor. Some medicines can make you feel dizzy. This can increase your chance of falling. Ask your doctor what other things that you can do to help prevent falls. This information is not intended to replace advice given to you by your health care provider. Make sure you discuss any questions you have with your health care provider. Document Released: 11/04/2008 Document Revised: 06/16/2015 Document Reviewed: 02/12/2014 Elsevier Interactive Patient Education  2017 [employer].

## 2021-10-26 ENCOUNTER — Ambulatory Visit: Payer: Self-pay

## 2021-10-26 NOTE — Telephone Encounter (Signed)
Requested Prescriptions  Pending Prescriptions Disp Refills  . SYMBICORT 160-4.5 MCG/ACT inhaler [Pharmacy Med Name: SYMBICORT 160-4.5 MCG/ACT Aerosol] 3 each 0    Sig: INHALE 2 PUFFS TWICE DAILY     Pulmonology:  Combination Products Passed - 10/25/2021  8:51 PM      Passed - Valid encounter within last 12 months    Recent Outpatient Visits          9 months ago Need for immunization against influenza   [place] Pickard, Cammie Mcgee, MD   1 year ago Chronic cough   [place] Susy Frizzle, MD   2 years ago Diabetes mellitus without complication [facility])   [place] Susy Frizzle, MD   2 years ago Other elevated white blood cell (WBC) count   [place] Dennard Schaumann, Cammie Mcgee, MD   2 years ago Viral upper respiratory tract infection   [place], [place], [place]             . Alcohol Swabs (DROPSAFE ALCOHOL PREP) 70 % PADS [Pharmacy Med Name: DROPSAFE ALCOHOL PREP PADS 70 % Pad] 200 each 0    Sig: USE AS DIRECTED TO MONITOR FINGERSTICK BLOOD SUGAR TWICE DAILY     Off-Protocol Failed - 10/25/2021  8:51 PM      Failed - Medication not assigned to a protocol, review manually.      Passed - Valid encounter within last 12 months    Recent Outpatient Visits          9 months ago Need for immunization against influenza   [place] Pickard, Cammie Mcgee, MD   1 year ago Chronic cough   [place] Susy Frizzle, MD   2 years ago Diabetes mellitus without complication [facility])   [place] Susy Frizzle, MD   2 years ago Other elevated white blood cell (WBC) count   [place] Dennard Schaumann, Cammie Mcgee, MD   2 years ago Viral upper respiratory tract infection   [place], [place], FNP

## 2021-10-26 NOTE — Telephone Encounter (Signed)
Requested medication (s) are due for refill today:yes  Requested medication (s) are on the active medication list: yes  Last refill:  10/24/20  Future visit scheduled: {yes  Notes to clinic:  Unable to refill per protocol, Medication not assigned to a protocol, review manually     Requested Prescriptions  Pending Prescriptions Disp Refills   Alcohol Swabs (DROPSAFE ALCOHOL PREP) 70 % PADS [Pharmacy Med Name: DROPSAFE ALCOHOL PREP PADS 70 % Pad] 200 each 0    Sig: USE AS DIRECTED TO MONITOR FINGERSTICK BLOOD SUGAR TWICE DAILY     Off-Protocol Failed - 10/25/2021  8:51 PM      Failed - Medication not assigned to a protocol, review manually.      Passed - Valid encounter within last 12 months    Recent Outpatient Visits           9 months ago Need for immunization against influenza   [place] Pickard, Cammie Mcgee, MD   1 year ago Chronic cough   [place] Susy Frizzle, MD   2 years ago Diabetes mellitus without complication [facility])   [place] Susy Frizzle, MD   2 years ago Other elevated white blood cell (WBC) count   [place] Dennard Schaumann, Cammie Mcgee, MD   2 years ago Viral upper respiratory tract infection   [place], [place], FNP              Signed Prescriptions Disp Refills   SYMBICORT 160-4.5 MCG/ACT inhaler 3 each 0    Sig: INHALE 2 PUFFS TWICE DAILY     Pulmonology:  Combination Products Passed - 10/25/2021  8:51 PM      Passed - Valid encounter within last 12 months    Recent Outpatient Visits           9 months ago Need for immunization against influenza   [place], Warren T, MD   1 year ago Chronic cough   [place] Susy Frizzle, MD   2 years ago Diabetes mellitus without complication [facility])   [place] Susy Frizzle, MD   2 years ago Other elevated white blood cell (WBC) count    [place] Dennard Schaumann, Cammie Mcgee, MD   2 years ago Viral upper respiratory tract infection   [place], [place], FNP

## 2021-10-30 ENCOUNTER — Ambulatory Visit: Payer: Medicare HMO

## 2021-11-20 ENCOUNTER — Telehealth: Payer: Self-pay

## 2021-11-20 ENCOUNTER — Other Ambulatory Visit: Payer: Self-pay | Admitting: Family Medicine

## 2021-11-20 NOTE — Telephone Encounter (Signed)
Pt called in stating that she is completely out of this med and is having a hard time without this med. Pt would like to get a courtesy refill sent to the [facility] on [address] In [place]. Pt has scheduled a f/u for med refills on 11/22/21.  traZODone (DESYREL) 50 MG tablet [[id number]]    Order Details Dose, Route, Frequency: As Directed  Dispense Quantity: 270 tablet Refills: 2        Sig: TAKE 3 TABLETS BY MOUTH AT BEDTIME AS NEEDED FOR SLEEP.    LOV: 07/20/21  [facility]Y: [facility] [address] [facility] [place]

## 2021-11-21 ENCOUNTER — Other Ambulatory Visit: Payer: Self-pay | Admitting: Family Medicine

## 2021-11-21 MED ORDER — TRAZODONE HCL 50 MG PO TABS: 150.0000 mg | ORAL_TABLET | Freq: Every day | ORAL | 2 refills | Status: DC

## 2021-11-22 ENCOUNTER — Ambulatory Visit (INDEPENDENT_AMBULATORY_CARE_PROVIDER_SITE_OTHER): Payer: Medicare HMO | Admitting: Family Medicine

## 2021-11-22 ENCOUNTER — Encounter: Payer: Self-pay | Admitting: Family Medicine

## 2021-11-22 VITALS — BP 130/80 | HR 102 | Ht 65.75 in | Wt 206.0 lb

## 2021-11-22 DIAGNOSIS — E119 Type 2 diabetes mellitus without complications: Secondary | ICD-10-CM | POA: Diagnosis not present

## 2021-11-22 DIAGNOSIS — I10 Essential (primary) hypertension: Secondary | ICD-10-CM | POA: Diagnosis not present

## 2021-11-22 DIAGNOSIS — Z23 Encounter for immunization: Secondary | ICD-10-CM

## 2021-11-22 MED ORDER — TRAZODONE HCL 50 MG PO TABS: 150.0000 mg | ORAL_TABLET | Freq: Every day | ORAL | 0 refills | Status: DC

## 2021-11-22 NOTE — Progress Notes (Signed)
Subjective:    Patient ID: [name], female    DOB: 26-Feb-1945, 76 y.o.   MRN: [id number]  01/02/21 Patient is here today requesting pain medication.  When I asked her to be specific, she states that she hurts all over her body.  I then asked her to try to pinpoint this exact location of her pain.  She states that she hurts in both hips posteriorly.  She hurts in both knees.  She hurts in both of her feet primarily her ankles.  She saw orthopedics.  She had x-rays obtained of both knees which showed moderate arthritis in the left and severe arthritis in the medial compartment on the right.  Orthopedics performed cortisone injections that helped temporarily.  However the patient states that she does not want to have to drive all the way to [place] cortisone shots.  "She wants them fixed".  She also saw podiatry who stated that there was nothing they can do to help her ankle other than to fuse the ankle due to arthritis between the tibia and talus.  She is not taking the Naprosyn prescribed by orthopedics because it upsets her stomach.  She also complains of burning posterior hip pain which is alleviated somewhat by gabapentin.  This is likely due to her degenerative disc disease and back.  For all of these reasons, she is very sedentary.  She states that her son is mad at her because he will not get up and try doing anything to get healthier such as exercise or walk.  Her son is concerned that she is going to continue to decline in health due to her sedentary nature.  I told the patient that I agree with her son's concerns.  Sedentary lifestyle could lead to weakening of the muscles in her hips and legs and ultimately inability to perform activities of daily living.  However the patient states she hurts too much to get up and walk.  Therefore I spent our entire visit on trying to focus on ways to improve her pain.  At that time, my plan was:  Patient is not currently taking any NSAID.  Recommended  starting Celebrex 200 mg daily to try to help with her multifactorial pain including her arthritis in her knees and ankle and degenerative disc disease.  Also performed cortisone shots in both knees.  Using sterile technique, I injected the right knee with 2 cc lidocaine, 2 cc of Marcaine, and 2 cc of 40 mg/mL Kenalog.  She tolerated this procedure well without complication.  I then injected the left knee using the same technique and medication.  She tolerated this as well.  Also recommended physical therapy with patient is resistant to physical therapy.  I encouraged physical therapy to try to prevent deconditioning.  If the patient changes her mind I will be glad to consult physical therapy moving forward.  07/20/21 Patient presents today requesting cortisone shots in both of her knees.  She states that I have to do something about the pain all over the body.  She states that her son feels like she should see a doctor closer to home who "knows what he is doing".  Patient is not taking Celebrex.  She never got the prescription filled.  She also never went to physical therapy but she declined last time.  She states the cortisone shots helped for a few months.  She had an MRI of her lower back performed in 2021.  Results are included below IMPRESSION: 1.  Interval development of severe degenerative disc disease at T12-L1, L1-2 and L2-3. Small disc extrusion at L2-3 inferior and to the left might affect the left L3 nerve. 2. Interval solid interbody fusion and posterior decompression at L4-5 and L5-S1. 3. Slight progression of degenerative disc disease at L3-4 without focal neural impingement. She denies numbness or tingling in her legs.  She does complain of bilateral hip pain with standing and walking.   Apparently she was getting cortisone injections in her hip from her previous orthopedist in [place].  She is no longer seeing. Patient reports diffuse body pain.  I recommended that she start by  taking an NSAID that I prescribed at the last visit Celebrex 200 mg daily in addition to the gabapentin 3 times a day.  Using sterile technique I injected both knees with the most of his lidocaine, 2 cc of Marcaine, and 2 cc of 40 mg per mill Kenalog.  She tolerated both injections without complication.  Hopefully this will help her knee pain.  Regarding her hip and low back pain, the question is whether this is due to lumbar radiculopathy and hip pain.  Based on the previous MRI I suspect hip pain secondary to osteoarthritis.  Obtain x-rays of both hips today and if osteoarthritis is centered in the hips I would recommend referral back to orthopedics for cortisone injections to try to improve functional mobility.  All the patient is here, she is overdue for lab work.  I will check a CBC CMP lipid panel and.  She is also due for mammogram.  Blood pressure is acceptable.    11/22/21 Patient never went to get the xrays of her hips I ordered at her last visit.  Patient states that she saw an orthopedist at [facility] who did x-rays and told her that she had a pinched nerve in her back.  He has referred her back to her back surgeon.  Today she is reporting pain that radiates from her back all the way down her right leg into her foot.  She is taking gabapentin 300 mg 3 times a day and she states that that calms the pain down when she takes it.  She has not yet called the orthopedist to make the appointment with the back surgeon.  She is requesting a refill on trazodone.  She is due for a flu shot as well as a pneumonia shot.  She states that the reason she made the appointment today was to get a refill on her trazodone.  Apparently our office refused a refill.  However I saw the prescription request yesterday and went ahead and refilled it.  She states that she has been out for 3 days and she is having trouble sleeping so she request that I send a prescription locally temporarily until the prescription comes from the  mail order.  She is due to check her diabetes lab work Past Medical History:  Diagnosis Date   Allergy    Anxiety    Arthritis    Asthma    Bursitis    Cataract    COPD (chronic obstructive pulmonary disease) ([place])    Depression    Hiatal hernia    Hypercholesteremia    Hypertension    Type 2 diabetes mellitus ([place])    Ulcer    Past Surgical History:  Procedure Laterality Date   ABDOMINAL HYSTERECTOMY     ANKLE ARTHROSCOPY WITH DRILLING/MICROFRACTURE Left 10/12/2015   Procedure: LEFT ANKLE ARTHROSCOPY, REMOVAL OF OSTEOCHONDRAL LESION WITH  MICROFRACTURE;  Surgeon: Trula Slade, DPM;  Location: [place];  Service: Podiatry;  Laterality: Left;   BIOPSY  10/24/2016   Procedure: BIOPSY;  Surgeon: Daneil Dolin, MD;  [facility];  Service: Endoscopy;;  gastric esophageal   CATARACT EXTRACTION [facility]  10/16/2010   Procedure: CATARACT EXTRACTION PHACO AND INTRAOCULAR LENS PLACEMENT ([place]);  Surgeon: Tonny Branch;  Location: [facility];  Service: Ophthalmology;  Laterality: Left;  CDE: 6.99   CATARACT EXTRACTION W/PHACO  12/18/2010   Procedure: CATARACT EXTRACTION PHACO AND INTRAOCULAR LENS PLACEMENT ([facility]);  Surgeon: Tonny Branch;  Location: [facility];  Service: Ophthalmology;  Laterality: Right;  CDE 12.50   COLONOSCOPY     about 5 years ago/[facility]   COLONOSCOPY WITH PROPOFOL N/A 10/24/2016   Procedure: COLONOSCOPY WITH PROPOFOL;  Surgeon: Daneil Dolin, MD;  [facility];  Service: Endoscopy;  Laterality: N/A;  215   ESOPHAGOGASTRODUODENOSCOPY  2003   Gastritis, path unavailable.   ESOPHAGOGASTRODUODENOSCOPY (EGD) WITH PROPOFOL N/A 10/24/2016   Procedure: ESOPHAGOGASTRODUODENOSCOPY (EGD) WITH PROPOFOL;  Surgeon: Daneil Dolin, MD;  [facility];  Service: Endoscopy;  Laterality: N/A;   EYE SURGERY     fracture lower back     left elbow tendon repair     NEUROPLASTY / TRANSPOSITION MEDIAN NERVE AT CARPAL TUNNEL BILATERAL     plantar fasc  bilateral     right rotator cuff     rt foot reconstruction     trigger thumb     Left   Current Outpatient Medications on File Prior to Visit  Medication Sig Dispense Refill   ACCU-CHEK GUIDE test strip TEST BLOOD SUGAR TWICE DAILY AS DIRECTED 200 strip 3   Accu-Chek Softclix Lancets lancets Use as directed to monitor FSBS 2x daily. Dx: E11.9 200 each 3   albuterol (VENTOLIN HFA) 108 (90 Base) MCG/ACT inhaler INHALE 1 TO 2 PUFFS EVERY 6 HOURS AS NEEDED FOR WHEEZING OR FOR SHORTNESS OF BREATH 1 each 10   Alcohol Swabs (DROPSAFE ALCOHOL PREP) 70 % PADS USE AS DIRECTED TO MONITOR FINGERSTICK BLOOD SUGAR TWICE DAILY 200 each 0   atorvastatin (LIPITOR) 80 MG tablet TAKE 1 TABLET EVERY DAY 90 tablet 1   Blood Glucose Calibration (ACCU-CHEK AVIVA) SOLN USE FOR GLUCOMETER COLLABORATION 1 each 0   Blood Glucose Monitoring Suppl (ACCU-CHEK AVIVA PLUS) w/Device KIT Use as directed to monitor FSBS 2x daily. Dx: E11.9 1 kit 1   celecoxib (CELEBREX) 200 MG capsule Take 1 capsule (200 mg total) by mouth daily. 90 capsule 3   gabapentin (NEURONTIN) 300 MG capsule TAKE 1 CAPSULE THREE TIMES DAILY 90 capsule 3   levocetirizine (XYZAL) 5 MG tablet TAKE 1 TABLET EVERY EVENING 90 tablet 3   metoprolol succinate (TOPROL-XL) 25 MG 24 hr tablet TAKE 1 TABLET EVERY DAY 90 tablet 3   montelukast (SINGULAIR) 10 MG tablet TAKE 1 TABLET (10 MG TOTAL) BY MOUTH AT BEDTIME. 90 tablet 3   pioglitazone (ACTOS) 15 MG tablet TAKE 1 TABLET EVERY DAY 90 tablet 3   Polyethyl Glycol-Propyl Glycol (SYSTANE OP) Apply 1 drop to eye 3 (three) times daily.     SYMBICORT 160-4.5 MCG/ACT inhaler INHALE 2 PUFFS TWICE DAILY 3 each 0   traZODone (DESYREL) 50 MG tablet Take 3 tablets (150 mg total) by mouth at bedtime. 270 tablet 2   venlafaxine XR (EFFEXOR-XR) 75 MG 24 hr capsule TAKE 2 CAPSULES EVERY DAY 180 capsule 3   No current facility-administered  medications on file prior to visit.   Allergies  Allergen Reactions   Codeine  Itching   Flonase [Fluticasone Propionate] Cough    Severe coughing   Metformin And Related Diarrhea    Caused severe diarrhea and severe GI symptoms---even on low dose 580m BID   Social History   Socioeconomic History   Marital status: Single    Spouse name: Not on file   Number of children: 2   Years of education: Not on file   Highest education level: Not on file  Occupational History   Not on file  Tobacco Use   Smoking status: Some Days    Packs/day: 0.50    Years: 52.00    Total pack years: 26.00    Types: Cigarettes   Smokeless tobacco: Never   Tobacco comments:    10/25/2021-declines low dose CT scan  Vaping Use   Vaping Use: Never used  Substance and Sexual Activity   Alcohol use: Yes    Comment: rare   Drug use: Yes    Frequency: 3.0 times per week    Types: Marijuana    Comment: not in a few months    Sexual activity: Not Currently    Birth control/protection: Surgical  Other Topics Concern   Not on file  Social History Narrative   2 sons, 1 son lives with patient.   2 boys 3 girls grandchildren, 3 great grandchildren.   Social Determinants of Health   Financial Resource Strain: Low Risk  (10/25/2021)   Overall Financial Resource Strain (CARDIA)    Difficulty of Paying Living Expenses: Not hard at all  Food Insecurity: No Food Insecurity (10/25/2021)   Hunger Vital Sign    Worried About Running Out of Food in the Last Year: Never true    Ran Out of Food in the Last Year: Never true  Transportation Needs: No Transportation Needs (10/25/2021)   PRAPARE - THydrologist(Medical): No    Lack of Transportation (Non-Medical): No  Physical Activity: Inactive (10/25/2021)   Exercise Vital Sign    Days of Exercise per Week: 0 days    Minutes of Exercise per Session: 0 min  Stress: No Stress Concern Present (10/25/2021)   [place]   Feeling of Stress : Not at all   Social Connections: Socially Isolated (10/25/2021)   Social Connection and Isolation Panel [NHANES]    Frequency of Communication with Friends and Family: More than three times a week    Frequency of Social Gatherings with Friends and Family: More than three times a week    Attends Religious Services: Never    AMarine scientistor Organizations: No    Attends CArchivistMeetings: Never    Marital Status: Widowed  Intimate Partner Violence: Not At Risk (10/25/2021)   Humiliation, Afraid, Rape, and Kick questionnaire    Fear of Current or Ex-Partner: No    Emotionally Abused: No    Physically Abused: No    Sexually Abused: No      Review of Systems  All other systems reviewed and are negative.      Objective:   Physical Exam Vitals reviewed.  Constitutional:      General: She is not in acute distress.    Appearance: Normal appearance. She is obese. She is not ill-appearing or toxic-appearing.  HENT:     Right Ear: Tympanic membrane and ear canal normal.  Left Ear: Tympanic membrane and ear canal normal.     Nose: Nose normal.  Cardiovascular:     Rate and Rhythm: Normal rate and regular rhythm.     Heart sounds: Murmur heard.  Pulmonary:     Effort: Pulmonary effort is normal. No respiratory distress.     Breath sounds: No stridor. Wheezing present. No rhonchi or rales.  Musculoskeletal:     Right hip: No bony tenderness. Decreased range of motion.     Left hip: No bony tenderness. Decreased range of motion.     Right knee: Decreased range of motion. Tenderness present over the medial joint line.     Left knee: Decreased range of motion. Tenderness present over the medial joint line.     Right lower leg: No edema.     Left lower leg: No edema.     Right ankle: Decreased range of motion.     Left ankle: Swelling present. Tenderness present. Decreased range of motion.  Lymphadenopathy:     Cervical: No cervical adenopathy.  Neurological:     Mental  Status: She is alert.           Assessment & Plan:  Primary hypertension  Diabetes mellitus without complication ([place]) - Plan: CBC with Differential/Platelet, COMPLETE METABOLIC PANEL WITH GFR, Lipid panel, Hemoglobin A1c, Protein / Creatinine Ratio, Urine I encouraged the patient to follow-up with her back surgeon regarding the lumbar radiculopathy.  I will continue to prescribe gabapentin 300 mg 3 times a day.  The patient received her flu shot today as well as Prevnar 20.  Her blood pressure is acceptable.  I will check a CBC a CMP a lipid panel and a urine protein creatinine make ratio.  Her goal hemoglobin A1c is less than 6.5.  I would like her LDL cholesterol to be below 100.  I refilled the patient's trazodone.

## 2021-11-22 NOTE — Addendum Note (Signed)
Addended by: Randal Buba K on: 11/22/2021 03:21 PM   Modules accepted: Orders

## 2021-11-23 LAB — COMPLETE METABOLIC PANEL WITH GFR
AG Ratio: 1.4 (calc) (ref 1.0–2.5)
ALT: 18 U/L (ref 6–29)
AST: 23 U/L (ref 10–35)
Albumin: 4 g/dL (ref 3.6–5.1)
Alkaline phosphatase (APISO): 122 U/L (ref 37–153)
BUN: 18 mg/dL (ref 7–25)
CO2: 27 mmol/L (ref 20–32)
Calcium: 9.8 mg/dL (ref 8.6–10.4)
Chloride: 104 mmol/L (ref 98–110)
Creat: 0.96 mg/dL (ref 0.60–1.00)
Globulin: 2.8 g/dL (calc) (ref 1.9–3.7)
Glucose, Bld: 103 mg/dL — ABNORMAL HIGH (ref 65–99)
Potassium: 4.9 mmol/L (ref 3.5–5.3)
Sodium: 142 mmol/L (ref 135–146)
Total Bilirubin: 0.4 mg/dL (ref 0.2–1.2)
Total Protein: 6.8 g/dL (ref 6.1–8.1)
eGFR: 62 mL/min/{1.73_m2} (ref >=60)

## 2021-11-23 LAB — CBC WITH DIFFERENTIAL/PLATELET
Absolute Monocytes: 625 cells/uL (ref 200–950)
Basophils Absolute: 47 cells/uL (ref 0–200)
Basophils Relative: 0.4 %
Eosinophils Absolute: 153 cells/uL (ref 15–500)
Eosinophils Relative: 1.3 %
HCT: 41 % (ref 35.0–45.0)
Hemoglobin: 13.2 g/dL (ref 11.7–15.5)
Lymphs Abs: 3162 cells/uL (ref 850–3900)
MCH: 28.9 pg (ref 27.0–33.0)
MCHC: 32.2 g/dL (ref 32.0–36.0)
MCV: 89.9 fL (ref 80.0–100.0)
MPV: 11.4 fL (ref 7.5–12.5)
Monocytes Relative: 5.3 %
Neutro Abs: 7812 cells/uL — ABNORMAL HIGH (ref 1500–7800)
Neutrophils Relative %: 66.2 %
Platelets: 340 10*3/uL (ref 140–400)
RBC: 4.56 10*6/uL (ref 3.80–5.10)
RDW: 13 % (ref 11.0–15.0)
Total Lymphocyte: 26.8 %
WBC: 11.8 10*3/uL — ABNORMAL HIGH (ref 3.8–10.8)

## 2021-11-23 LAB — LIPID PANEL
Cholesterol: 155 mg/dL (ref <200)
HDL: 62 mg/dL (ref >=50)
LDL Cholesterol (Calc): 74 mg/dL (calc)
Non-HDL Cholesterol (Calc): 93 mg/dL (calc) (ref <130)
Total CHOL/HDL Ratio: 2.5 (calc) (ref <5.0)
Triglycerides: 106 mg/dL (ref <150)

## 2021-11-23 LAB — PROTEIN / CREATININE RATIO, URINE
Creatinine, Urine: 200 mg/dL (ref 20–275)
Protein/Creat Ratio: 385 mg/g creat — ABNORMAL HIGH (ref 24–184)
Protein/Creatinine Ratio: 0.385 mg/mg creat — ABNORMAL HIGH (ref 0.024–0.184)
Total Protein, Urine: 77 mg/dL — ABNORMAL HIGH (ref 5–24)

## 2021-11-23 LAB — HEMOGLOBIN A1C
Hgb A1c MFr Bld: 6.2 % of total Hgb — ABNORMAL HIGH (ref <5.7)
Mean Plasma Glucose: 131 mg/dL
eAG (mmol/L): 7.3 mmol/L

## 2021-11-24 ENCOUNTER — Other Ambulatory Visit: Payer: Self-pay

## 2021-11-24 MED ORDER — DAPAGLIFLOZIN PROPANEDIOL 10 MG PO TABS: 10.0000 mg | ORAL_TABLET | Freq: Every day | ORAL | 0 refills | Status: DC

## 2021-11-27 ENCOUNTER — Telehealth: Payer: Self-pay

## 2021-11-27 ENCOUNTER — Other Ambulatory Visit: Payer: Self-pay

## 2021-11-27 MED ORDER — GABAPENTIN 300 MG PO CAPS: ORAL_CAPSULE | ORAL | 3 refills | Status: DC

## 2021-11-27 NOTE — Telephone Encounter (Signed)
Disregard, duplicate

## 2021-11-28 ENCOUNTER — Other Ambulatory Visit: Payer: Self-pay | Admitting: Family Medicine

## 2021-12-08 ENCOUNTER — Encounter: Payer: Self-pay | Admitting: *Deleted

## 2021-12-27 DIAGNOSIS — Z981 Arthrodesis status: Secondary | ICD-10-CM | POA: Diagnosis not present

## 2021-12-27 DIAGNOSIS — M545 Low back pain, unspecified: Secondary | ICD-10-CM | POA: Diagnosis not present

## 2021-12-27 DIAGNOSIS — M47896 Other spondylosis, lumbar region: Secondary | ICD-10-CM | POA: Diagnosis not present

## 2021-12-27 DIAGNOSIS — M7061 Trochanteric bursitis, right hip: Secondary | ICD-10-CM | POA: Diagnosis not present

## 2022-01-01 ENCOUNTER — Telehealth: Payer: Self-pay | Admitting: Orthopaedic Surgery

## 2022-01-01 DIAGNOSIS — Z0000 Encounter for general adult medical examination without abnormal findings: Secondary | ICD-10-CM | POA: Insufficient documentation

## 2022-01-01 NOTE — Telephone Encounter (Signed)
Patient called back and stated that she can't get the x-rays.  I told her per [facility]'s note it looks like they have her setup for a CT.  She is going to call them and follow up with them.

## 2022-01-01 NOTE — Telephone Encounter (Signed)
Patient called requesting to schedule an appointment with Dr. Luna Glasgow for her right hip, having pain.  She went to see someone in [place] on 12/27/21, they also did x-rays.  I advised her to get those notes and x-rays to [place] for review and then we'll go from there to get her scheduled.

## 2022-01-08 ENCOUNTER — Other Ambulatory Visit: Payer: Self-pay | Admitting: Family Medicine

## 2022-01-12 ENCOUNTER — Other Ambulatory Visit: Payer: Self-pay | Admitting: Family Medicine

## 2022-01-23 DIAGNOSIS — I700 Atherosclerosis of aorta: Secondary | ICD-10-CM | POA: Insufficient documentation

## 2022-01-23 DIAGNOSIS — M353 Polymyalgia rheumatica: Secondary | ICD-10-CM | POA: Insufficient documentation

## 2022-02-05 ENCOUNTER — Other Ambulatory Visit: Payer: Self-pay | Admitting: Family Medicine

## 2022-03-26 ENCOUNTER — Other Ambulatory Visit: Payer: Self-pay | Admitting: Family Medicine

## 2022-03-28 ENCOUNTER — Other Ambulatory Visit: Payer: Self-pay | Admitting: Family Medicine

## 2022-03-28 NOTE — Telephone Encounter (Signed)
OV 11/22/21- will continue Requested Prescriptions  Pending Prescriptions Disp Refills   metoprolol succinate (TOPROL-XL) 25 MG 24 hr tablet [Pharmacy Med Name: METOPROLOL SUCCINATE ER 25 MG Tablet Extended Release 24 Hour] 90 tablet 3    Sig: TAKE 1 TABLET EVERY DAY     Cardiovascular:  Beta Blockers Failed - 03/28/2022 10:43 AM      Failed - Valid encounter within last 6 months    Recent Outpatient Visits           1 year ago Need for immunization against influenza   [place], Cammie Mcgee, MD   1 year ago Chronic cough   [place] Susy Frizzle, MD   2 years ago Diabetes mellitus without complication [facility])   [place] Susy Frizzle, MD   2 years ago Other elevated white blood cell (WBC) count   [place] Dennard Schaumann, Cammie Mcgee, MD   3 years ago Viral upper respiratory tract infection   [place], Crystal A, FNP              Passed - Last BP in normal range    BP Readings from Last 1 Encounters:  11/22/21 130/80         Passed - Last Heart Rate in normal range    Pulse Readings from Last 1 Encounters:  11/22/21 (!) 102

## 2022-04-11 ENCOUNTER — Other Ambulatory Visit: Payer: Self-pay | Admitting: Family Medicine

## 2022-04-23 ENCOUNTER — Other Ambulatory Visit: Payer: Self-pay | Admitting: Family Medicine

## 2022-07-04 ENCOUNTER — Other Ambulatory Visit: Payer: Self-pay | Admitting: Family Medicine

## 2022-07-05 NOTE — Telephone Encounter (Signed)
Requested by interface surescripts. Last OV 11/22/21.  Requested Prescriptions  Pending Prescriptions Disp Refills   ACCU-CHEK GUIDE test strip [Pharmacy Med Name: ACCU-CHEK GUIDE   Strip] 200 strip 3    Sig: TEST BLOOD SUGAR TWO TIMES DAILY AS DIRECTED     Endocrinology: Diabetes - Testing Supplies Failed - 07/04/2022  7:09 PM      Failed - Valid encounter within last 12 months    Recent Outpatient Visits           1 year ago Need for immunization against influenza   [facility] Donita Brooks, MD   2 years ago Chronic cough   [facility] Donita Brooks, MD   2 years ago Diabetes mellitus without complication [facility])   [facility] Donita Brooks, MD   3 years ago Other elevated white blood cell (WBC) count   [facility] Tanya Nones, Priscille Heidelberg, MD   3 years ago Viral upper respiratory tract infection   [facility] Jenne Pane, Crystal A, FNP               celecoxib (CELEBREX) 200 MG capsule [Pharmacy Med Name: CELECOXIB 200 MG Capsule] 90 capsule 0    Sig: TAKE 1 CAPSULE EVERY DAY     Analgesics:  COX2 Inhibitors Failed - 07/04/2022  7:09 PM      Failed - Manual Review: Labs are only required if the patient has taken medication for more than 8 weeks.      Failed - Valid encounter within last 12 months    Recent Outpatient Visits           1 year ago Need for immunization against influenza   [facility] Donita Brooks, MD   2 years ago Chronic cough   [facility] Tanya Nones, Priscille Heidelberg, MD   2 years ago Diabetes mellitus without complication (HCC)   [facility] Donita Brooks, MD   3 years ago Other elevated white blood cell (WBC) count   [facility] Tanya Nones, Priscille Heidelberg, MD   3 years ago Viral upper respiratory tract infection   [facility] Jenne Pane, Crystal A, FNP              Passed - HGB in normal range  and within 360 days    Hemoglobin  Date Value Ref Range Status  11/22/2021 13.2 11.7 - 15.5 g/dL Final         Passed - Cr in normal range and within 360 days    Creat  Date Value Ref Range Status  11/22/2021 0.96 0.60 - 1.00 mg/dL Final   Creatinine, Urine  Date Value Ref Range Status  11/22/2021 200 20 - 275 mg/dL Final         Passed - HCT in normal range and within 360 days    HCT  Date Value Ref Range Status  11/22/2021 41.0 35.0 - 45.0 % Final         Passed - AST in normal range and within 360 days    AST  Date Value Ref Range Status  11/22/2021 23 10 - 35 U/L Final         Passed - ALT in normal range and within 360 days    ALT  Date Value Ref Range Status  11/22/2021 18 6 - 29 U/L Final         Passed -  eGFR is 30 or above and within 360 days    GFR, Est African American  Date Value Ref Range Status  04/18/2020 69 > OR = 60 mL/min/1.91m2 Final   GFR, Est Non African American  Date Value Ref Range Status  04/18/2020 60 > OR = 60 mL/min/1.37m2 Final   eGFR  Date Value Ref Range Status  11/22/2021 62 > OR = 60 mL/min/1.17m2 Final         Passed - Patient is not pregnant       traZODone (DESYREL) 50 MG tablet [Pharmacy Med Name: TRAZODONE HYDROCHLORIDE 50 MG Tablet] 270 tablet 3    Sig: TAKE 3 TABLETS AT BEDTIME     Psychiatry: Antidepressants - Serotonin Modulator Failed - 07/04/2022  7:09 PM      Failed - Valid encounter within last 6 months    Recent Outpatient Visits           1 year ago Need for immunization against influenza   [facility] Donita Brooks, MD   2 years ago Chronic cough   [facility] Donita Brooks, MD   2 years ago Diabetes mellitus without complication [facility])   [facility] Donita Brooks, MD   3 years ago Other elevated white blood cell (WBC) count   [facility] Tanya Nones, Priscille Heidelberg, MD   3 years ago Viral upper respiratory tract infection   [facility] Jenne Pane, Crystal A, FNP              Passed - Completed PHQ-2 or PHQ-9 in the last 360 days

## 2022-07-05 NOTE — Telephone Encounter (Signed)
Requested medication (s) are due for refill today: expired medication  Requested medication (s) are on the active medication list: yes   Last refill:  accu check guide test strips - 06/29/21 #200 3 refills, trazodone 11/21/21 #270 2 refills   Future visit scheduled: no  Notes to clinic:  last OV 11/22/21. Accu check test strips expired. Do you want to renew Rx? Do you want to continue refilling trazodone?     Requested Prescriptions  Pending Prescriptions Disp Refills   ACCU-CHEK GUIDE test strip [Pharmacy Med Name: ACCU-CHEK GUIDE   Strip] 200 strip 3    Sig: TEST BLOOD SUGAR TWO TIMES DAILY AS DIRECTED     Endocrinology: Diabetes - Testing Supplies Failed - 07/04/2022  7:09 PM      Failed - Valid encounter within last 12 months    Recent Outpatient Visits           1 year ago Need for immunization against influenza   [facility] Medicine Donita Brooks, MD   2 years ago Chronic cough   [facility] Donita Brooks, MD   2 years ago Diabetes mellitus without complication (HCC)   [facility] Medicine Donita Brooks, MD   3 years ago Other elevated white blood cell (WBC) count   [facility] Medicine Tanya Nones, Priscille Heidelberg, MD   3 years ago Viral upper respiratory tract infection   [facility] Medicine Jenne Pane, Crystal A, FNP               traZODone (DESYREL) 50 MG tablet [Pharmacy Med Name: TRAZODONE HYDROCHLORIDE 50 MG Tablet] 270 tablet 3    Sig: TAKE 3 TABLETS AT BEDTIME     Psychiatry: Antidepressants - Serotonin Modulator Failed - 07/04/2022  7:09 PM      Failed - Valid encounter within last 6 months    Recent Outpatient Visits           1 year ago Need for immunization against influenza   [facility] Medicine Donita Brooks, MD   2 years ago Chronic cough   [facility] Medicine Donita Brooks, MD   2 years ago Diabetes mellitus without complication (HCC)   [facility] Medicine Donita Brooks, MD   3 years ago Other elevated white blood cell (WBC) count   [facility] Medicine Donita Brooks, MD   3 years ago Viral upper respiratory tract infection   [facility] Medicine Jenne Pane, Rocky Crafts, FNP              Passed - Completed PHQ-2 or PHQ-9 in the last 360 days      Signed Prescriptions Disp Refills   celecoxib (CELEBREX) 200 MG capsule 90 capsule 0    Sig: TAKE 1 CAPSULE EVERY DAY     Analgesics:  COX2 Inhibitors Failed - 07/04/2022  7:09 PM      Failed - Manual Review: Labs are only required if the patient has taken medication for more than 8 weeks.      Failed - Valid encounter within last 12 months    Recent Outpatient Visits           1 year ago Need for immunization against influenza   [facility] Medicine Pickard, Priscille Heidelberg, MD   2 years ago Chronic cough   [facility] Medicine Donita Brooks, MD   2 years ago Diabetes mellitus without complication (HCC)  [facility] Medicine Pickard, Priscille Heidelberg, MD   3 years ago Other elevated white blood cell (WBC) count   [facility] Tanya Nones, Priscille Heidelberg, MD   3 years ago Viral upper respiratory tract infection   [facility] Medicine Jenne Pane, Crystal A, FNP              Passed - HGB in normal range and within 360 days    Hemoglobin  Date Value Ref Range Status  11/22/2021 13.2 11.7 - 15.5 g/dL Final         Passed - Cr in normal range and within 360 days    Creat  Date Value Ref Range Status  11/22/2021 0.96 0.60 - 1.00 mg/dL Final   Creatinine, Urine  Date Value Ref Range Status  11/22/2021 200 20 - 275 mg/dL Final         Passed - HCT in normal range and within 360 days    HCT  Date Value Ref Range Status  11/22/2021 41.0 35.0 - 45.0 % Final         Passed - AST in normal range and within 360 days    AST  Date Value Ref Range Status  11/22/2021 23 10 - 35 U/L Final         Passed - ALT in normal range and within 360 days     ALT  Date Value Ref Range Status  11/22/2021 18 6 - 29 U/L Final         Passed - eGFR is 30 or above and within 360 days    GFR, Est African American  Date Value Ref Range Status  04/18/2020 69 > OR = 60 mL/min/1.63m2 Final   GFR, Est Non African American  Date Value Ref Range Status  04/18/2020 60 > OR = 60 mL/min/1.38m2 Final   eGFR  Date Value Ref Range Status  11/22/2021 62 > OR = 60 mL/min/1.70m2 Final         Passed - Patient is not pregnant

## 2022-07-31 ENCOUNTER — Ambulatory Visit (INDEPENDENT_AMBULATORY_CARE_PROVIDER_SITE_OTHER): Payer: Medicare HMO | Admitting: Family Medicine

## 2022-07-31 ENCOUNTER — Telehealth: Payer: Self-pay

## 2022-07-31 ENCOUNTER — Encounter: Payer: Self-pay | Admitting: Family Medicine

## 2022-07-31 VITALS — BP 126/78 | HR 95 | Temp 97.9°F | Ht 65.75 in | Wt 175.0 lb

## 2022-07-31 DIAGNOSIS — E119 Type 2 diabetes mellitus without complications: Secondary | ICD-10-CM | POA: Diagnosis not present

## 2022-07-31 DIAGNOSIS — J439 Emphysema, unspecified: Secondary | ICD-10-CM | POA: Diagnosis not present

## 2022-07-31 DIAGNOSIS — R0602 Shortness of breath: Secondary | ICD-10-CM | POA: Diagnosis not present

## 2022-07-31 LAB — CBC WITH DIFFERENTIAL/PLATELET
Basophils Absolute: 62 cells/uL (ref 0–200)
Eosinophils Relative: 0.6 %
HCT: 42.1 % (ref 35.0–45.0)
Hemoglobin: 14.1 g/dL (ref 11.7–15.5)
Lymphs Abs: 3214 cells/uL (ref 850–3900)
MCV: 83.5 fL (ref 80.0–100.0)
Neutro Abs: 11450 cells/uL — ABNORMAL HIGH (ref 1500–7800)
Neutrophils Relative %: 73.4 %
Platelets: 338 10*3/uL (ref 140–400)
RDW: 13.7 % (ref 11.0–15.0)

## 2022-07-31 NOTE — Progress Notes (Signed)
Subjective:    Patient ID: [name], female    DOB: Nov 04, 1945, 77 y.o.   MRN: [id number]  Patient states that she has been feeling poorly recently.  She states that she feels drained and has no energy.  Recently her son "kicked her out".  She was having to live in a car for over a week.  Now she has an apartment in [place].  She has a chronic cough.  She has long-term history of smoking.  She does report increasing shortness of breath.  She also reports increased swelling in her legs.  Today there is no significant edema in her legs however she does have pain left-sided crackles.  She denies any hemoptysis or pleurisy.  There is no significant wheezing today on exam.  She denies any chest pain.  She had a cardiac workup in 2020 which included a normal echocardiogram and a normal stress test. Past Medical History:  Diagnosis Date   Allergy    Anxiety    Arthritis    Asthma    Bursitis    Cataract    COPD (chronic obstructive pulmonary disease) (HCC)    Depression    Hiatal hernia    Hypercholesteremia    Hypertension    Type 2 diabetes mellitus (HCC)    Ulcer    Past Surgical History:  Procedure Laterality Date   ABDOMINAL HYSTERECTOMY     ANKLE ARTHROSCOPY WITH DRILLING/MICROFRACTURE Left 10/12/2015   Procedure: LEFT ANKLE ARTHROSCOPY, REMOVAL OF OSTEOCHONDRAL LESION WITH MICROFRACTURE;  Surgeon: Vivi Barrack, DPM;  Location: [facility];  Service: Podiatry;  Laterality: Left;   BIOPSY  10/24/2016   Procedure: BIOPSY;  Surgeon: Corbin Ade, MD;  [facility];  Service: Endoscopy;;  gastric esophageal   CATARACT EXTRACTION [facility]  10/16/2010   Procedure: CATARACT EXTRACTION PHACO AND INTRAOCULAR LENS PLACEMENT (IOC);  Surgeon: Gemma Payor;  Location: [facility];  Service: Ophthalmology;  Laterality: Left;  CDE: 6.99   CATARACT EXTRACTION W/PHACO  12/18/2010   Procedure: CATARACT EXTRACTION PHACO AND INTRAOCULAR LENS PLACEMENT (IOC);  Surgeon:  Gemma Payor;  Location: [facility];  Service: Ophthalmology;  Laterality: Right;  CDE 12.50   COLONOSCOPY     about 5 years ago/[facility]   COLONOSCOPY WITH PROPOFOL N/A 10/24/2016   Procedure: COLONOSCOPY WITH PROPOFOL;  Surgeon: Corbin Ade, MD;  [facility];  Service: Endoscopy;  Laterality: N/A;  215   ESOPHAGOGASTRODUODENOSCOPY  2003   Gastritis, path unavailable.   ESOPHAGOGASTRODUODENOSCOPY (EGD) WITH PROPOFOL N/A 10/24/2016   Procedure: ESOPHAGOGASTRODUODENOSCOPY (EGD) WITH PROPOFOL;  Surgeon: Corbin Ade, MD;  [facility];  Service: Endoscopy;  Laterality: N/A;   EYE SURGERY     fracture lower back     left elbow tendon repair     NEUROPLASTY / TRANSPOSITION MEDIAN NERVE AT CARPAL TUNNEL BILATERAL     plantar fasc bilateral     right rotator cuff     rt foot reconstruction     trigger thumb     Left   Current Outpatient Medications on File Prior to Visit  Medication Sig Dispense Refill   ACCU-CHEK GUIDE test strip TEST BLOOD SUGAR TWO TIMES DAILY AS DIRECTED 200 strip 3   Accu-Chek Softclix Lancets lancets Use as directed to monitor FSBS 2x daily. Dx: E11.9 200 each 3   albuterol (VENTOLIN HFA) 108 (90 Base) MCG/ACT inhaler INHALE 1 TO 2 PUFFS EVERY 6 HOURS AS NEEDED FOR WHEEZING OR FOR  SHORTNESS OF BREATH 1 each 10   Alcohol Swabs (DROPSAFE ALCOHOL PREP) 70 % PADS USE AS DIRECTED TO MONITOR FINGERSTICK BLOOD SUGAR TWICE DAILY 200 each 3   atorvastatin (LIPITOR) 80 MG tablet TAKE 1 TABLET EVERY DAY 90 tablet 3   Blood Glucose Calibration (ACCU-CHEK AVIVA) SOLN USE FOR GLUCOMETER COLLABORATION 1 each 0   Blood Glucose Monitoring Suppl (ACCU-CHEK AVIVA PLUS) w/Device KIT Use as directed to monitor FSBS 2x daily. Dx: E11.9 1 kit 1   celecoxib (CELEBREX) 200 MG capsule TAKE 1 CAPSULE EVERY DAY 90 capsule 0   dapagliflozin propanediol (FARXIGA) 10 MG TABS tablet TAKE 1 TABLET EVERY DAY BEFORE BREAKFAST 90 tablet 1   gabapentin (NEURONTIN) 300 MG capsule TAKE 1  CAPSULE THREE TIMES DAILY 90 capsule 3   levocetirizine (XYZAL) 5 MG tablet TAKE 1 TABLET EVERY EVENING 90 tablet 3   metoprolol succinate (TOPROL-XL) 25 MG 24 hr tablet TAKE 1 TABLET EVERY DAY 90 tablet 1   montelukast (SINGULAIR) 10 MG tablet TAKE 1 TABLET (10 MG TOTAL) BY MOUTH AT BEDTIME. 90 tablet 3   Polyethyl Glycol-Propyl Glycol (SYSTANE OP) Apply 1 drop to eye 3 (three) times daily.     SYMBICORT 160-4.5 MCG/ACT inhaler INHALE 2 PUFFS TWICE DAILY 3 each 0   traZODone (DESYREL) 50 MG tablet Take 3 tablets (150 mg total) by mouth at bedtime. 270 tablet 2   traZODone (DESYREL) 50 MG tablet Take 3 tablets (150 mg total) by mouth at bedtime. 21 tablet 0   traZODone (DESYREL) 50 MG tablet TAKE 3 TABLETS AT BEDTIME 270 tablet 3   venlafaxine XR (EFFEXOR-XR) 75 MG 24 hr capsule TAKE 2 CAPSULES EVERY DAY 180 capsule 3   No current facility-administered medications on file prior to visit.   Allergies  Allergen Reactions   Codeine Itching   Flonase [Fluticasone Propionate] Cough    Severe coughing   Metformin And Related Diarrhea    Caused severe diarrhea and severe GI symptoms---even on low dose 500mg  BID   Social History   Socioeconomic History   Marital status: Single    Spouse name: Not on file   Number of children: 2   Years of education: Not on file   Highest education level: Not on file  Occupational History   Not on file  Tobacco Use   Smoking status: Some Days    Packs/day: 0.50    Years: 52.00    Additional pack years: 0.00    Total pack years: 26.00    Types: Cigarettes   Smokeless tobacco: Never   Tobacco comments:    10/25/2021-declines low dose CT scan  Vaping Use   Vaping Use: Never used  Substance and Sexual Activity   Alcohol use: Yes    Comment: rare   Drug use: Yes    Frequency: 3.0 times per week    Types: Marijuana    Comment: not in a few months    Sexual activity: Not Currently    Birth control/protection: Surgical  Other Topics Concern   Not  on file  Social History Narrative   2 sons, 1 son lives with patient.   2 boys 3 girls grandchildren, 3 great grandchildren.   Social Determinants of Health   Financial Resource Strain: Low Risk  (10/25/2021)   Overall Financial Resource Strain (CARDIA)    Difficulty of Paying Living Expenses: Not hard at all  Food Insecurity: No Food Insecurity (10/25/2021)   Hunger Vital Sign    Worried About Running  Out of Food in the Last Year: Never true    Ran Out of Food in the Last Year: Never true  Transportation Needs: No Transportation Needs (10/25/2021)   PRAPARE - Administrator, Civil Service (Medical): No    Lack of Transportation (Non-Medical): No  Physical Activity: Inactive (10/25/2021)   Exercise Vital Sign    Days of Exercise per Week: 0 days    Minutes of Exercise per Session: 0 min  Stress: No Stress Concern Present (10/25/2021)   [employer] of Occupational Health - Occupational Stress Questionnaire    Feeling of Stress : Not at all  Social Connections: Socially Isolated (10/25/2021)   Social Connection and Isolation Panel [NHANES]    Frequency of Communication with Friends and Family: More than three times a week    Frequency of Social Gatherings with Friends and Family: More than three times a week    Attends Religious Services: Never    Database administrator or Organizations: No    Attends Banker Meetings: Never    Marital Status: Widowed  Intimate Partner Violence: Not At Risk (10/25/2021)   Humiliation, Afraid, Rape, and Kick questionnaire    Fear of Current or Ex-Partner: No    Emotionally Abused: No    Physically Abused: No    Sexually Abused: No      Review of Systems  All other systems reviewed and are negative.      Objective:   Physical Exam Vitals reviewed.  Constitutional:      General: She is not in acute distress.    Appearance: Normal appearance. She is obese. She is not ill-appearing or toxic-appearing.  HENT:      Right Ear: Tympanic membrane and ear canal normal.     Left Ear: Tympanic membrane and ear canal normal.     Nose: Nose normal.  Cardiovascular:     Rate and Rhythm: Normal rate and regular rhythm.     Heart sounds: Murmur heard.  Pulmonary:     Effort: Pulmonary effort is normal. No respiratory distress.     Breath sounds: No stridor. Rales present. No wheezing or rhonchi.    Musculoskeletal:     Right lower leg: No edema.     Left lower leg: No edema.  Lymphadenopathy:     Cervical: No cervical adenopathy.  Neurological:     Mental Status: She is alert.           Assessment & Plan:  Diabetes mellitus without complication (HCC) - Plan: CBC with Differential/Platelet, Lipid panel, COMPLETE METABOLIC PANEL WITH GFR, Hemoglobin A1c  SOB (shortness of breath) - Plan: Brain natriuretic peptide, DG Chest 2 View Patient has faint left basilar crackles and reports recent edema in her legs with increasing shortness of breath.  For that reason I will check a BNP.  I will also check a chest x-ray.  Given her recent fatigue, I will check a CBC, CMP.  Given her history of diabetes I will check a fasting lipid panel along with an A1c to evaluate for any potential reasons the patient feels so washed out and tired using her terminology.  Today she states she is feeling better.

## 2022-07-31 NOTE — Addendum Note (Signed)
Addended by: Venia Carbon K on: 07/31/2022 11:22 AM   Modules accepted: Orders

## 2022-07-31 NOTE — Progress Notes (Signed)
   Care Guide Note  07/31/2022 Name: [name] MRN: [id number] DOB: 1945-04-05  Referred by: Donita Brooks, MD Reason for referral : Care Coordination (Outreach to schedule with Pharm d )   [name] is a 77 y.o. year old female who is a primary care patient of Donita Brooks, MD. [name] was referred to the pharmacist for assistance related to COPD.    Successful contact was made with the patient to discuss pharmacy services including being ready for the pharmacist to call at least 5 minutes before the scheduled appointment time, to have medication bottles and any blood sugar or blood pressure readings ready for review. The patient agreed to meet with the pharmacist via with the pharmacist via telephone visit on (date/time).  08/22/2022  Penne Lash, RMA Care Guide [facility] [place]  [place], [place] [address] Direct Dial: [phone] [email or web address]

## 2022-08-01 LAB — COMPLETE METABOLIC PANEL WITH GFR
AG Ratio: 1.3 (calc) (ref 1.0–2.5)
ALT: 27 U/L (ref 6–29)
AST: 45 U/L — ABNORMAL HIGH (ref 10–35)
Albumin: 3.9 g/dL (ref 3.6–5.1)
Alkaline phosphatase (APISO): 127 U/L (ref 37–153)
BUN/Creatinine Ratio: 17 (calc) (ref 6–22)
BUN: 28 mg/dL — ABNORMAL HIGH (ref 7–25)
CO2: 27 mmol/L (ref 20–32)
Calcium: 9.4 mg/dL (ref 8.6–10.4)
Chloride: 99 mmol/L (ref 98–110)
Creat: 1.65 mg/dL — ABNORMAL HIGH (ref 0.60–1.00)
Globulin: 3.1 g/dL (calc) (ref 1.9–3.7)
Glucose, Bld: 120 mg/dL — ABNORMAL HIGH (ref 65–99)
Potassium: 4.7 mmol/L (ref 3.5–5.3)
Sodium: 139 mmol/L (ref 135–146)
Total Bilirubin: 0.4 mg/dL (ref 0.2–1.2)
Total Protein: 7 g/dL (ref 6.1–8.1)
eGFR: 32 mL/min/{1.73_m2} — ABNORMAL LOW (ref >=60)

## 2022-08-01 LAB — HEMOGLOBIN A1C
Hgb A1c MFr Bld: 6.2 % of total Hgb — ABNORMAL HIGH (ref <5.7)
Mean Plasma Glucose: 131 mg/dL
eAG (mmol/L): 7.3 mmol/L

## 2022-08-01 LAB — BRAIN NATRIURETIC PEPTIDE: Brain Natriuretic Peptide: 36 pg/mL (ref <100)

## 2022-08-01 LAB — CBC WITH DIFFERENTIAL/PLATELET
Absolute Monocytes: 780 cells/uL (ref 200–950)
Basophils Relative: 0.4 %
Eosinophils Absolute: 94 cells/uL (ref 15–500)
MCH: 28 pg (ref 27.0–33.0)
MCHC: 33.5 g/dL (ref 32.0–36.0)
MPV: 11.7 fL (ref 7.5–12.5)
Monocytes Relative: 5 %
RBC: 5.04 10*6/uL (ref 3.80–5.10)
Total Lymphocyte: 20.6 %
WBC: 15.6 10*3/uL — ABNORMAL HIGH (ref 3.8–10.8)

## 2022-08-01 LAB — LIPID PANEL
Cholesterol: 149 mg/dL (ref <200)
HDL: 39 mg/dL — ABNORMAL LOW (ref >=50)
LDL Cholesterol (Calc): 78 mg/dL (calc)
Non-HDL Cholesterol (Calc): 110 mg/dL (calc) (ref <130)
Total CHOL/HDL Ratio: 3.8 (calc) (ref <5.0)
Triglycerides: 220 mg/dL — ABNORMAL HIGH (ref <150)

## 2022-08-14 ENCOUNTER — Ambulatory Visit (HOSPITAL_COMMUNITY)
Admission: RE | Admit: 2022-08-14 | Discharge: 2022-08-14 | Disposition: A | Discharge status: Home / Self Care | Payer: Medicare PPO | Source: Ambulatory Visit | Attending: Family Medicine | Admitting: Family Medicine

## 2022-08-14 DIAGNOSIS — J45909 Unspecified asthma, uncomplicated: Secondary | ICD-10-CM | POA: Diagnosis not present

## 2022-08-14 DIAGNOSIS — Z87891 Personal history of nicotine dependence: Secondary | ICD-10-CM | POA: Diagnosis not present

## 2022-08-14 DIAGNOSIS — R0602 Shortness of breath: Secondary | ICD-10-CM | POA: Diagnosis not present

## 2022-08-15 ENCOUNTER — Other Ambulatory Visit: Payer: Self-pay

## 2022-08-15 DIAGNOSIS — I10 Essential (primary) hypertension: Secondary | ICD-10-CM

## 2022-08-15 DIAGNOSIS — N183 Chronic kidney disease, stage 3 unspecified: Secondary | ICD-10-CM | POA: Diagnosis not present

## 2022-08-15 DIAGNOSIS — E119 Type 2 diabetes mellitus without complications: Secondary | ICD-10-CM

## 2022-08-15 DIAGNOSIS — J439 Emphysema, unspecified: Secondary | ICD-10-CM

## 2022-08-16 LAB — BASIC METABOLIC PANEL
BUN: 10 mg/dL (ref 7–25)
CO2: 28 mmol/L (ref 20–32)
Calcium: 9.3 mg/dL (ref 8.6–10.4)
Chloride: 103 mmol/L (ref 98–110)
Creat: 0.87 mg/dL (ref 0.60–1.00)
Glucose, Bld: 126 mg/dL — ABNORMAL HIGH (ref 65–99)
Sodium: 142 mmol/L (ref 135–146)

## 2022-08-22 ENCOUNTER — Other Ambulatory Visit: Payer: Medicare HMO | Admitting: Pharmacist

## 2022-08-22 ENCOUNTER — Telehealth: Payer: Self-pay | Admitting: Pharmacist

## 2022-08-22 NOTE — Telephone Encounter (Signed)
Please route PAP for [name], Farxiga 10mg  (AZ&me) to Dr. Caren Macadam office  You can route me patient's portion   Thank you!

## 2022-08-22 NOTE — Progress Notes (Cosign Needed Addendum)
08/22/2022 Name: [name] MRN: [id number] DOB: [birth year]  Chief Complaint  Patient presents with   COPD    [name] is a 76 y.o. year old female who presented for a telephone visit.   They were referred to the pharmacist by their PCP for assistance in managing medication access, primarily for her inhaler (Symbicort).   Relevant PMH includes COPD/asthma, tobacco use disorder, HTN, T2DM, CKD3a  Subjective: Pt reports that she has been out of her Symbicort (budesonide/formoterol) inhaler for ~2 mo. She receives her medications through [facility] order, unsure why she stopped receiving Symbicort - but thinks it may be due to cost.   Denies recent COPD exacerbation. No recent prescriptions for doxycycline or prednisone. At PCP visit on 7/9 pt did report incr SOB, had elevated WBC. CXR on 7/23 was clear. Feels that Symbicort mainly helps her clear her throat - has ongoing congestion. She would like to be back on a maintenance inhaler. Albuterol does not help her symptoms. Does not report cost concerns with Marcelline Deist (dapagliflozin) - 90d supply dispensed on 07/09/22. However, appears in chart review that pt previously received medication through [facility]. Pt reports that Dr. Tanya Nones took her off [place]. Noted in chart where he recommended holding Farxiga due to illness (07/31/22).   Care Team: Primary Care Provider: Donita Brooks, MD ; Next visit not scheduled.   Medication Access/Adherence  Current Pharmacy:  [facility] - [place], [place] - [address] [place] [address] Phone: [phone] Fax: [phone]  [id number] - [address], [place] - [address] [facility] [place] [address] Phone: [phone] Fax: [phone]   Patient reports affordability concerns with their medications: Yes , Symbicort (budesonide/formoterol) Patient reports access/transportation concerns to their pharmacy: No , [employer]  mail order Patient reports adherence concerns with their medications:  No    COPD:  Current medications: Medications tried in the past: Symbicort (budesonide/formoterol) 160-4.5,   Reports 0 exacerbations in the past year.   Symptoms include: throat congestion, some SOB   Current smoker (65 years) - LESS THAN 1/2 PACK DAY --25 pack year - quit for 4 years when she had her children. Does not feel ready to quit at this time.   Current medication access support: none, prev on [facility]Me for Farxiga (2023)   Objective:  Lab Results  Component Value Date   HGBA1C 6.2 (H) 07/31/2022   Lab Results  Component Value Date   CREATININE 0.87 08/15/2022   BUN 10 08/15/2022   NA 142 08/15/2022   K 4.4 08/15/2022   CL 103 08/15/2022   CO2 28 08/15/2022    Lab Results  Component Value Date   CHOL 149 07/31/2022   HDL 39 (L) 07/31/2022   LDLCALC 78 07/31/2022   TRIG 220 (H) 07/31/2022   CHOLHDL 3.8 07/31/2022   Medications Reviewed Today     Reviewed by Danella Maiers, [facility] (Pharmacist) on 08/22/22 at 0909  Med List Status: <None>   Medication Order Taking? Sig Documenting Provider Last Dose Status Informant  ACCU-CHEK GUIDE test strip [id number] No TEST BLOOD SUGAR TWO TIMES DAILY AS DIRECTED Donita Brooks, MD Taking Active   Accu-Chek Softclix Lancets lancets [id number] No Use as directed to monitor FSBS 2x daily. Dx: E11.9 Donita Brooks, MD Taking Active   albuterol (VENTOLIN HFA) 108 (90 Base) MCG/ACT inhaler [id number] No INHALE 1 TO 2 PUFFS EVERY 6 HOURS AS NEEDED FOR WHEEZING OR  FOR SHORTNESS OF Lollie Sails, MD Taking Active   Alcohol Swabs (DROPSAFE ALCOHOL PREP) 70 % PADS [id number] No USE AS DIRECTED TO MONITOR FINGERSTICK BLOOD SUGAR TWICE DAILY Donita Brooks, MD Taking Active   atorvastatin (LIPITOR) 80 MG tablet [id number] No TAKE 1 TABLET EVERY DAY Donita Brooks, MD Taking Active   Blood Glucose Calibration (ACCU-CHEK AVIVA) SOLN [id number] No USE  FOR GLUCOMETER COLLABORATION Donita Brooks, MD Taking Active   Blood Glucose Monitoring Suppl (ACCU-CHEK AVIVA PLUS) w/Device KIT [id number] No Use as directed to monitor FSBS 2x daily. Dx: E11.9 Donita Brooks, MD Taking Active   celecoxib (CELEBREX) 200 MG capsule [id number] No TAKE 1 CAPSULE EVERY DAY Donita Brooks, MD Taking Active   dapagliflozin propanediol (FARXIGA) 10 MG TABS tablet [id number] No TAKE 1 TABLET EVERY DAY BEFORE BREAKFAST Donita Brooks, MD Taking Active   gabapentin (NEURONTIN) 300 MG capsule [id number] No TAKE 1 CAPSULE THREE TIMES DAILY Donita Brooks, MD Taking Active   levocetirizine (XYZAL) 5 MG tablet [id number] No TAKE 1 TABLET EVERY EVENING Donita Brooks, MD Taking Active   metoprolol succinate (TOPROL-XL) 25 MG 24 hr tablet [id number] No TAKE 1 TABLET EVERY DAY Donita Brooks, MD Taking Active   montelukast (SINGULAIR) 10 MG tablet [id number] No TAKE 1 TABLET (10 MG TOTAL) BY MOUTH AT BEDTIME. Donita Brooks, MD Taking Active   Polyethyl Glycol-Propyl Glycol [facility] OP) [id number] No Apply 1 drop to eye 3 (three) times daily. [provider] Taking Active Self  SYMBICORT 160-4.5 MCG/ACT inhaler [id number] No INHALE 2 PUFFS TWICE DAILY Donita Brooks, MD Taking Active   traZODone (DESYREL) 50 MG tablet [id number] No Take 3 tablets (150 mg total) by mouth at bedtime. Donita Brooks, MD Taking Active   traZODone (DESYREL) 50 MG tablet [id number] No Take 3 tablets (150 mg total) by mouth at bedtime. Donita Brooks, MD Taking Active   traZODone (DESYREL) 50 MG tablet [id number] No TAKE 3 TABLETS AT BEDTIME Donita Brooks, MD Taking Active   venlafaxine XR (EFFEXOR-XR) 75 MG 24 hr capsule [id number] No TAKE 2 CAPSULES EVERY DAY Donita Brooks, MD Taking Active            COPD: - Currently uncontrolled given recent SOB and lack of a maintenance inhaler. However, COPD appears non-severe at this time without recent exacerbations.  LABA/LAMA preferred over ICS/LABA in COPD to reduce exacerbations.  - Recommend to switch from Symbicort (budesonide, formoterol) to Breztri (budesonide, glycopyrrolate, formoterol) 2 puffs twice daily given mixed COPD/asthma. - Reviewed appropriate inhaler technique. Will check if patient can obtain Breztri sample at PCP office to start therapy prior to MAP approval.  - Initiated patient assistance  through [facility]Me for Breztri (budesonide, glycopyrrolate, formoterol). Will collaborate with provider, CPhT, and patient to pursue assistance.  - Advised pt that quitting smoking will improve breathing symptoms. Offered support to quit, but patient not interested at this time.   T2DM: Currently controlled with A1c 6.2% below goal <7%. Suspect that pt should resume Farxiga (dapagliflozin) with resolution of illness.  - Contacted PCP [place] to confirm that patient should restart Farxiga - Will pursue MAP for Farxiga through AZ[facility]Me since patient was previously approved.  Follow Up Plan: Call patient when PAP forms are ready for completion.   Nils Pyle, PharmD PGY1 Pharmacy Resident  Kieth Brightly, PharmD, BCACP Clinical Pharmacist, [facility], [facility]

## 2022-08-23 ENCOUNTER — Other Ambulatory Visit: Payer: Self-pay | Admitting: Family Medicine

## 2022-08-23 MED ORDER — BREZTRI AEROSPHERE 160-9-4.8 MCG/ACT IN AERO: 2.0000 | INHALATION_SPRAY | Freq: Two times a day (BID) | RESPIRATORY_TRACT | 11 refills | Status: DC

## 2022-08-24 ENCOUNTER — Telehealth: Payer: Self-pay | Admitting: Pharmacist

## 2022-08-24 ENCOUNTER — Other Ambulatory Visit: Payer: Self-pay | Admitting: Family Medicine

## 2022-08-24 MED ORDER — DAPAGLIFLOZIN PROPANEDIOL 10 MG PO TABS: 10.0000 mg | ORAL_TABLET | Freq: Every day | ORAL | 3 refills | Status: DC

## 2022-08-24 NOTE — Telephone Encounter (Signed)
    08/24/2022 Name: [name] MRN: [id number] DOB: 03-Dec-1945   Message left with patient re: Celebrex for arthritis.  Discussed with PCP and relayed the message for patient to only use Celebrex sparingly/as needed.  Her kidney function was elevated (Scr 1.65), but now returned to Scr 0.87).  Encouraged patient to reach out to make appt with PCP to discuss other options for treatment      Latest Ref Rng & Units 08/15/2022   10:23 AM 07/31/2022   10:43 AM 11/22/2021   12:26 PM  BMP  Glucose 65 - 99 mg/dL 829  562  130   BUN 7 - 25 mg/dL 10  28  18    Creatinine 0.60 - 1.00 mg/dL 8.65  7.84  6.96   BUN/Creat Ratio 6 - 22 (calc) SEE NOTE:  17  SEE NOTE:   Sodium 135 - 146 mmol/L 142  139  142   Potassium 3.5 - 5.3 mmol/L 4.4  4.7  4.9   Chloride 98 - 110 mmol/L 103  99  104   CO2 20 - 32 mmol/L 28  27  27    Calcium 8.6 - 10.4 mg/dL 9.3  9.4  9.8     Kieth Brightly, PharmD, BCACP Clinical Pharmacist, [facility]

## 2022-09-18 NOTE — Telephone Encounter (Signed)
Pcp pages faxed to Dr. Tanya Nones

## 2022-09-23 IMAGING — MG MM DIGITAL SCREENING BILAT W/ TOMO AND CAD
6 of 12 series · 6 of 36 positions shown · non-contrast
Comparison: Previous exam(s).

ACR Breast Density Category a: The breast tissue is almost entirely
fatty.

CLINICAL DATA: Screening.

EXAM:
DIGITAL SCREENING BILATERAL MAMMOGRAM WITH TOMOSYNTHESIS AND CAD
TECHNIQUE: Bilateral screening digital craniocaudal and mediolateral oblique
mammograms were obtained. Bilateral screening digital breast
tomosynthesis was performed. The images were evaluated with
computer-aided detection.

[L MLO synth-2D (1 of 2)]
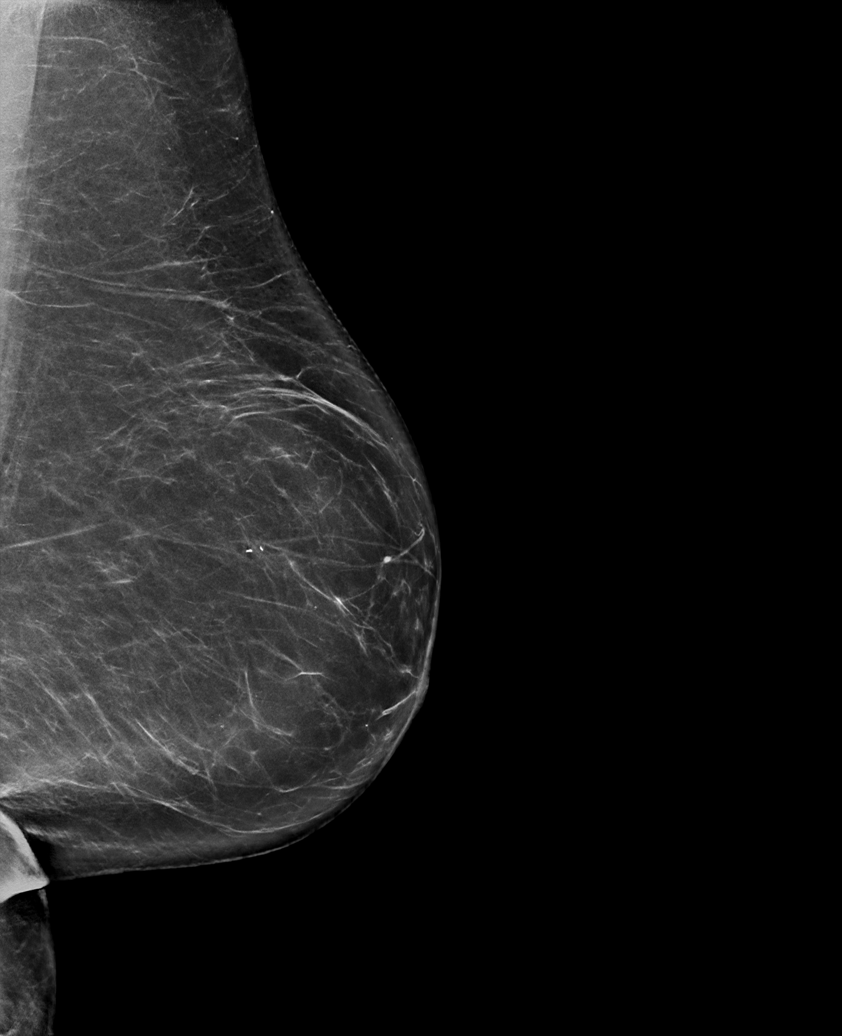

[L CC synth-2D]
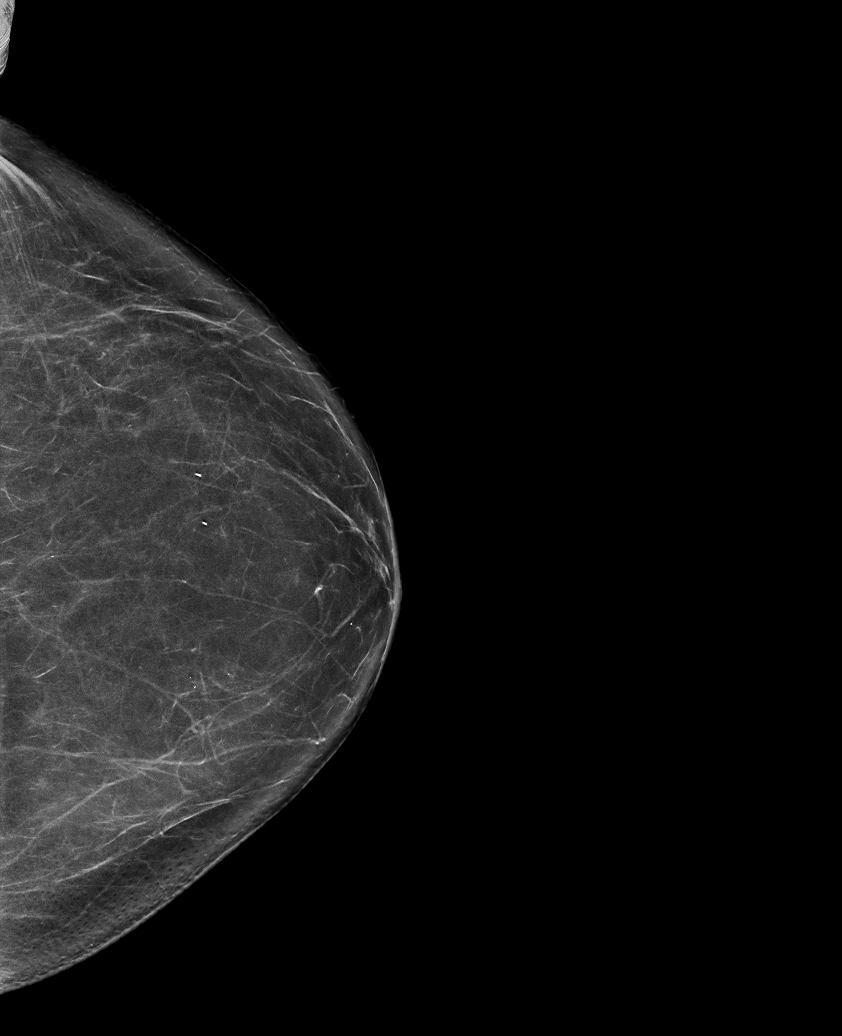

[L MLO synth-2D (2 of 2)]
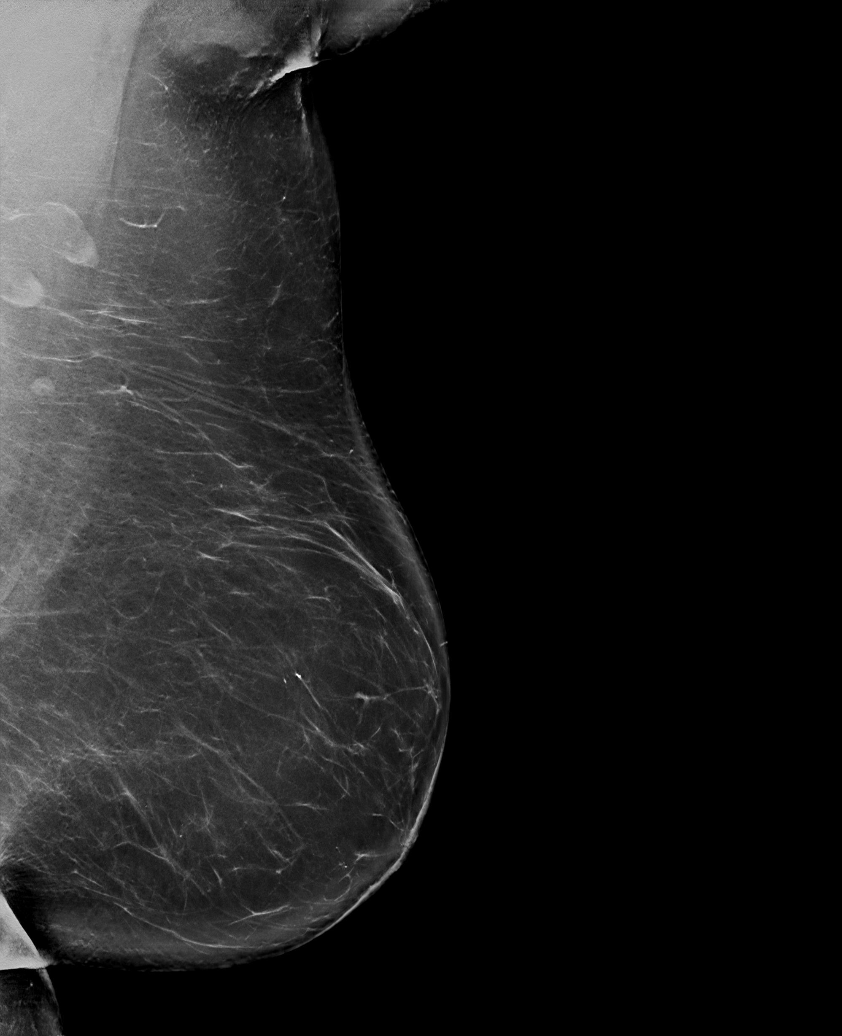

[R CC synth-2D]
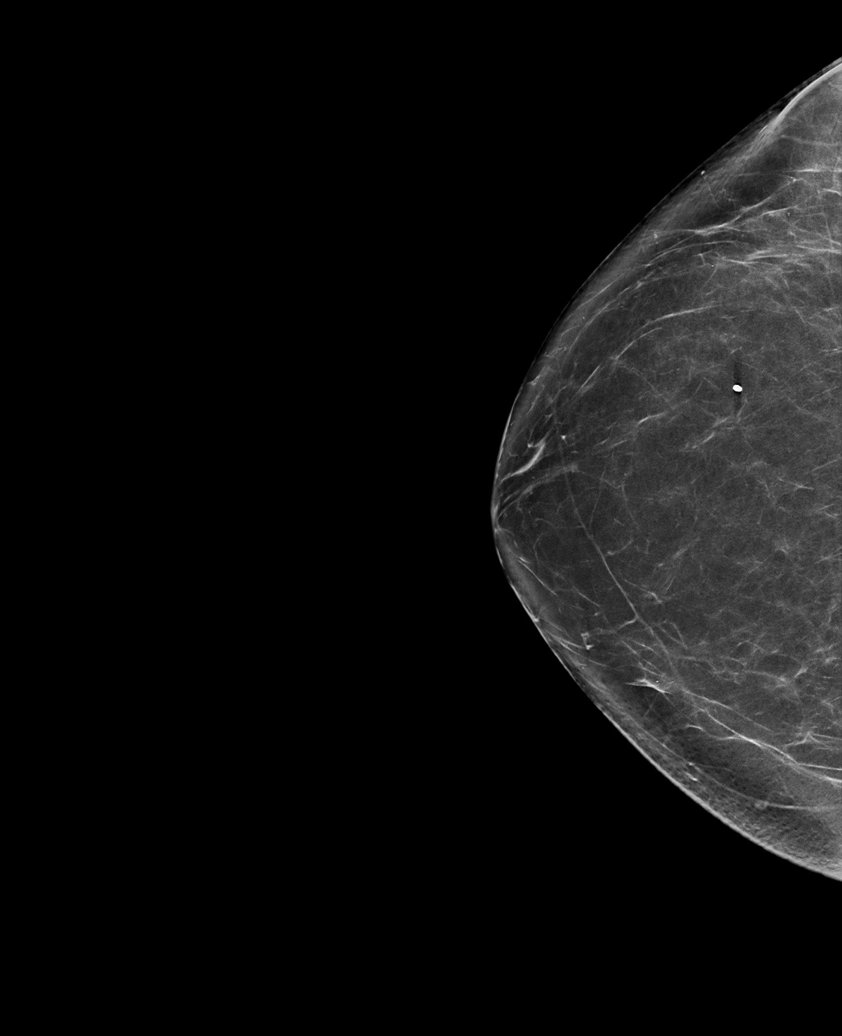

[R MLO synth-2D (1 of 2)]
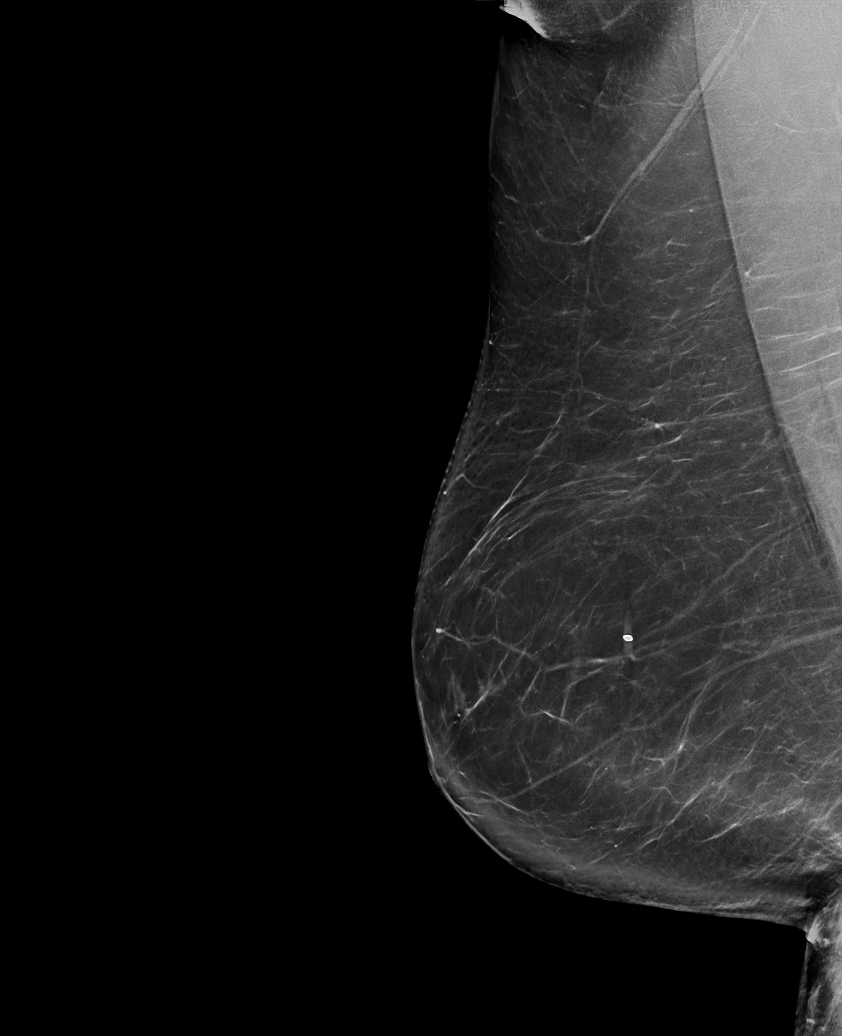

[R MLO synth-2D (2 of 2)]
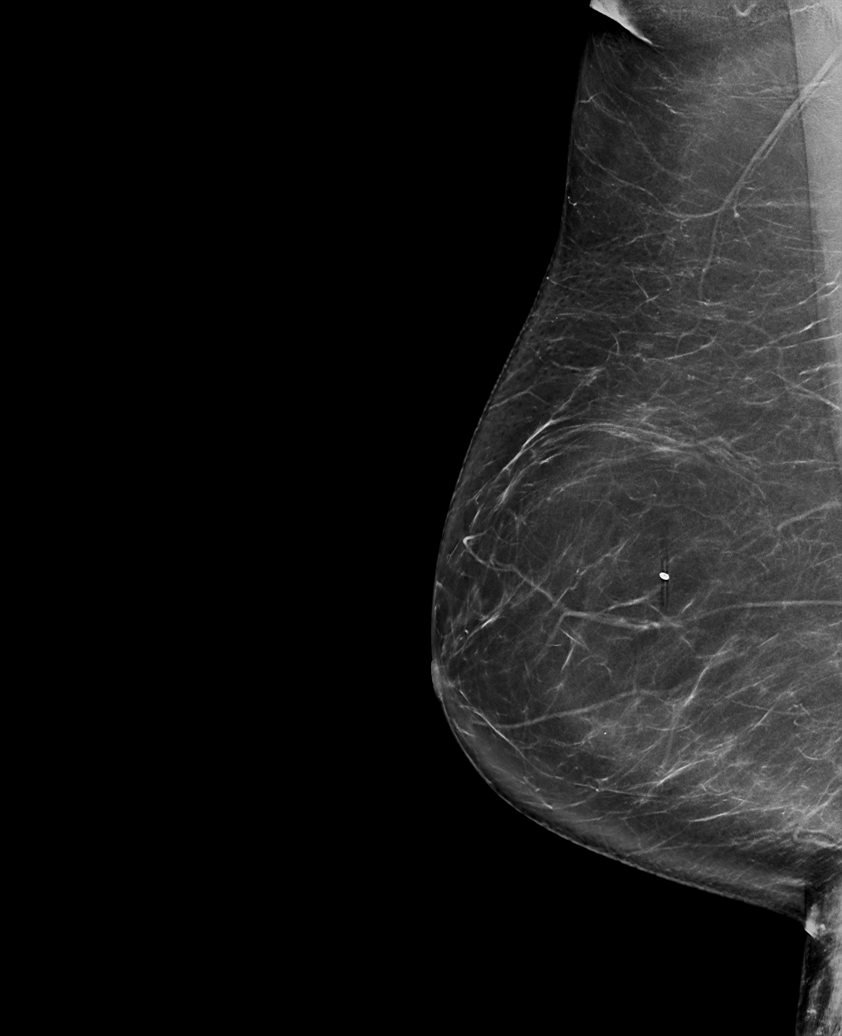

[6 of 36 positions shown; findings below may reference images not displayed]

FINDINGS: There are no findings suspicious for malignancy. The images were
evaluated with computer-aided detection.
IMPRESSION: No mammographic evidence of malignancy. A result letter of this
screening mammogram will be mailed directly to the patient.

RECOMMENDATION:
Screening mammogram in one year. (Code:[id number])

BI-RADS CATEGORY  1: Negative.

## 2022-09-26 ENCOUNTER — Other Ambulatory Visit: Payer: Self-pay | Admitting: Family Medicine

## 2022-09-27 NOTE — Telephone Encounter (Signed)
Requested Prescriptions  Pending Prescriptions Disp Refills   venlafaxine XR (EFFEXOR-XR) 75 MG 24 hr capsule [Pharmacy Med Name: Venlafaxine HCl ER Oral Capsule Extended Release 24 Hour 75 MG] 180 capsule 3    Sig: TAKE 2 CAPSULES EVERY DAY     Psychiatry: Antidepressants - SNRI - desvenlafaxine & venlafaxine Failed - 09/26/2022  3:26 PM      Failed - Valid encounter within last 6 months    Recent Outpatient Visits           1 year ago Need for immunization against influenza   [facility] Donita Brooks, MD   2 years ago Chronic cough   [facility] Donita Brooks, MD   3 years ago Diabetes mellitus without complication [facility])   [facility] Donita Brooks, MD   3 years ago Other elevated white blood cell (WBC) count   [facility] Tanya Nones, Priscille Heidelberg, MD   3 years ago Viral upper respiratory tract infection   [facility] Jenne Pane, Crystal A, FNP              Failed - Lipid Panel in normal range within the last 12 months    Cholesterol  Date Value Ref Range Status  07/31/2022 149 <200 mg/dL Final   LDL Cholesterol (Calc)  Date Value Ref Range Status  07/31/2022 78 mg/dL (calc) Final    Comment:    Reference range: <100 . Desirable range <100 mg/dL for primary prevention;   <70 mg/dL for patients with CHD or diabetic patients  with > or = 2 CHD risk factors. Marland Kitchen LDL-C is now calculated using the Martin-Hopkins  calculation, which is a validated novel method providing  better accuracy than the Friedewald equation in the  estimation of LDL-C.  Horald Pollen et al. Lenox Ahr. 9147;829(56): 2061-2068  (http://education.[facility].com/faq/FAQ164)    HDL  Date Value Ref Range Status  07/31/2022 39 (L) > OR = 50 mg/dL Final   Triglycerides  Date Value Ref Range Status  07/31/2022 220 (H) <150 mg/dL Final    Comment:    . If a non-fasting specimen was collected, consider repeat  triglyceride testing on a fasting specimen if clinically indicated.  Perry Mount et al. J. of Clin. Lipidol. 2015;9:129-169. Marland Kitchen          Passed - Cr in normal range and within 360 days    Creat  Date Value Ref Range Status  08/15/2022 0.87 0.60 - 1.00 mg/dL Final   Creatinine, Urine  Date Value Ref Range Status  11/22/2021 200 20 - 275 mg/dL Final         Passed - Completed PHQ-2 or PHQ-9 in the last 360 days      Passed - Last BP in normal range    BP Readings from Last 1 Encounters:  07/31/22 126/78          metoprolol succinate (TOPROL-XL) 25 MG 24 hr tablet [Pharmacy Med Name: Metoprolol Succinate ER Oral Tablet Extended Release 24 Hour 25 MG] 90 tablet 3    Sig: TAKE 1 TABLET EVERY DAY     Cardiovascular:  Beta Blockers Failed - 09/26/2022  3:26 PM      Failed - Valid encounter within last 6 months    Recent Outpatient Visits           1 year ago Need for immunization against influenza   [facility] Pickard, Priscille Heidelberg, MD   2  years ago Chronic cough   [facility] Donita Brooks, MD   3 years ago Diabetes mellitus without complication [facility])   [facility] Donita Brooks, MD   3 years ago Other elevated white blood cell (WBC) count   [facility] Tanya Nones, Priscille Heidelberg, MD   3 years ago Viral upper respiratory tract infection   [facility] Jenne Pane, Crystal A, FNP              Passed - Last BP in normal range    BP Readings from Last 1 Encounters:  07/31/22 126/78         Passed - Last Heart Rate in normal range    Pulse Readings from Last 1 Encounters:  07/31/22 95

## 2022-10-02 NOTE — Telephone Encounter (Signed)
PAP: Application for [name] has been submitted to PAP Companies: [employer], via fax

## 2022-10-05 ENCOUNTER — Other Ambulatory Visit: Payer: Self-pay | Admitting: Family Medicine

## 2022-10-08 NOTE — Telephone Encounter (Signed)
Requested Prescriptions  Pending Prescriptions Disp Refills   gabapentin (NEURONTIN) 300 MG capsule [Pharmacy Med Name: Gabapentin Oral Capsule 300 MG] 360 capsule 2    Sig: TAKE 1 CAPSULE THREE TIMES DAILY     Neurology: Anticonvulsants - gabapentin Failed - 10/05/2022  2:44 PM      Failed - Valid encounter within last 12 months    Recent Outpatient Visits           1 year ago Need for immunization against influenza   [facility] Tanya Nones, Priscille Heidelberg, MD   2 years ago Chronic cough   [facility] Donita Brooks, MD   3 years ago Diabetes mellitus without complication (HCC)   [facility] Donita Brooks, MD   3 years ago Other elevated white blood cell (WBC) count   [facility] Donita Brooks, MD   3 years ago Viral upper respiratory tract infection   [facility] Jenne Pane, Rocky Crafts, FNP              Passed - Cr in normal range and within 360 days    Creat  Date Value Ref Range Status  08/15/2022 0.87 0.60 - 1.00 mg/dL Final   Creatinine, Urine  Date Value Ref Range Status  11/22/2021 200 20 - 275 mg/dL Final         Passed - Completed PHQ-2 or PHQ-9 in the last 360 days       dapagliflozin propanediol (FARXIGA) 10 MG TABS tablet [Pharmacy Med Name: Marcelline Deist Oral Tablet 10 MG] 90 tablet 1    Sig: TAKE 1 TABLET EVERY DAY BEFORE BREAKFAST     Endocrinology:  Diabetes - SGLT2 Inhibitors Failed - 10/05/2022  2:44 PM      Failed - eGFR in normal range and within 360 days    GFR, Est African American  Date Value Ref Range Status  04/18/2020 69 > OR = 60 mL/min/1.9m2 Final   GFR, Est Non African American  Date Value Ref Range Status  04/18/2020 60 > OR = 60 mL/min/1.2m2 Final   eGFR  Date Value Ref Range Status  07/31/2022 32 (L) > OR = 60 mL/min/1.76m2 Final         Failed - Valid encounter within last 6 months    Recent Outpatient Visits           1 year ago Need for  immunization against influenza   [facility] Donita Brooks, MD   2 years ago Chronic cough   [facility] Donita Brooks, MD   3 years ago Diabetes mellitus without complication (HCC)   [facility] Donita Brooks, MD   3 years ago Other elevated white blood cell (WBC) count   [facility] Donita Brooks, MD   3 years ago Viral upper respiratory tract infection   [facility] Jenne Pane, Rocky Crafts, FNP              Passed - Cr in normal range and within 360 days    Creat  Date Value Ref Range Status  08/15/2022 0.87 0.60 - 1.00 mg/dL Final   Creatinine, Urine  Date Value Ref Range Status  11/22/2021 200 20 - 275 mg/dL Final         Passed - HBA1C is between 0 and 7.9 and within 180 days    Hgb A1C (fingerstick)  Date Value  Ref Range Status  12/07/2015 6.1 (H) <5.7 % Final    Comment:                                                                           According to the ADA Clinical Practice Recommendations for 2011, when HbA1c is used as a screening test:     >=6.5%   Diagnostic of Diabetes Mellitus            (if abnormal result is confirmed)   5.7-6.4%   Increased risk of developing Diabetes Mellitus   References:Diagnosis and Classification of Diabetes Mellitus,Diabetes Care,2011,34(Suppl 1):S62-S69 and Standards of Medical Care in         Diabetes - 2011,Diabetes Care,2011,34 (Suppl 1):S11-S61.      Hgb A1c MFr Bld  Date Value Ref Range Status  07/31/2022 6.2 (H) <5.7 % of total Hgb Final    Comment:    For someone without known diabetes, a hemoglobin  A1c value between 5.7% and 6.4% is consistent with prediabetes and should be confirmed with a  follow-up test. . For someone with known diabetes, a value <7% indicates that their diabetes is well controlled. A1c targets should be individualized based on duration of diabetes, age, comorbid conditions, and  other considerations. . This assay result is consistent with an increased risk of diabetes. . Currently, no consensus exists regarding use of hemoglobin A1c for diagnosis of diabetes for children. Marland Kitchen

## 2022-11-01 ENCOUNTER — Ambulatory Visit: Payer: Medicare PPO

## 2022-12-03 ENCOUNTER — Telehealth: Payer: Self-pay

## 2022-12-03 NOTE — Telephone Encounter (Signed)
Copied from CRM [id number]. Topic: Clinical - Request for Lab/Test Order >> Nov 30, 2022 12:13 PM Desma Mcgregor wrote: Reason for CRM: Clara looking to get a status of a lab req order faxed 11/22/2022. Cb# [phone] reference # [id number]

## 2022-12-14 ENCOUNTER — Telehealth: Payer: Self-pay

## 2022-12-14 NOTE — Telephone Encounter (Signed)
Copied from CRM [id number]. Topic: Clinical - Lab/Test Results >> Dec 13, 2022  4:50 PM Danika B wrote: Reason for CRM: Wanted to speak with clinical staff about an update on if Doctor has made a medical decision regarding patients labs. Callback# [phone]; Ref# [id number]

## 2022-12-23 DIAGNOSIS — I10 Essential (primary) hypertension: Secondary | ICD-10-CM | POA: Diagnosis not present

## 2022-12-23 DIAGNOSIS — Z7902 Long term (current) use of antithrombotics/antiplatelets: Secondary | ICD-10-CM | POA: Diagnosis not present

## 2022-12-23 DIAGNOSIS — F331 Major depressive disorder, recurrent, moderate: Secondary | ICD-10-CM | POA: Diagnosis not present

## 2022-12-23 DIAGNOSIS — G8929 Other chronic pain: Secondary | ICD-10-CM | POA: Diagnosis not present

## 2023-01-11 ENCOUNTER — Other Ambulatory Visit: Payer: Self-pay | Admitting: Family Medicine

## 2023-01-17 ENCOUNTER — Encounter: Payer: Medicare PPO | Admitting: Family Medicine

## 2023-01-17 ENCOUNTER — Encounter: Payer: Self-pay | Admitting: Family Medicine

## 2023-01-17 ENCOUNTER — Ambulatory Visit (INDEPENDENT_AMBULATORY_CARE_PROVIDER_SITE_OTHER): Payer: Medicare PPO | Admitting: Family Medicine

## 2023-01-17 VITALS — BP 132/76 | HR 70 | Temp 98.0°F | Ht 65.75 in | Wt 165.4 lb

## 2023-01-17 DIAGNOSIS — I340 Nonrheumatic mitral (valve) insufficiency: Secondary | ICD-10-CM | POA: Diagnosis not present

## 2023-01-17 DIAGNOSIS — E1169 Type 2 diabetes mellitus with other specified complication: Secondary | ICD-10-CM

## 2023-01-17 DIAGNOSIS — J439 Emphysema, unspecified: Secondary | ICD-10-CM | POA: Diagnosis not present

## 2023-01-17 DIAGNOSIS — E119 Type 2 diabetes mellitus without complications: Secondary | ICD-10-CM

## 2023-01-17 DIAGNOSIS — Z0000 Encounter for general adult medical examination without abnormal findings: Secondary | ICD-10-CM

## 2023-01-17 DIAGNOSIS — Z0001 Encounter for general adult medical examination with abnormal findings: Secondary | ICD-10-CM

## 2023-01-17 DIAGNOSIS — Z23 Encounter for immunization: Secondary | ICD-10-CM | POA: Diagnosis not present

## 2023-01-17 DIAGNOSIS — I10 Essential (primary) hypertension: Secondary | ICD-10-CM | POA: Diagnosis not present

## 2023-01-17 DIAGNOSIS — Z780 Asymptomatic menopausal state: Secondary | ICD-10-CM | POA: Diagnosis not present

## 2023-01-17 DIAGNOSIS — Z1231 Encounter for screening mammogram for malignant neoplasm of breast: Secondary | ICD-10-CM

## 2023-01-17 NOTE — Progress Notes (Signed)
Subjective:    Patient ID: [name], female    DOB: 12/02/1945, 77 y.o.   MRN: [id number] Patient is here today for a complete physical exam.  She is due for the pneumonia vaccine.  She is due for a flu shot.  She is due for COVID shot.  She is due for RSV.  She is due for tetanus shot.  She due for shingrix.  She declines the shingles vaccine as she has already had shingles.  She declines a tetanus shot, the COVID shot, and RSV.  She is willing to get the new pneumonia vaccine (Capvaxive) along with a flu shot.  Her last colonoscopy was in 2018.  I recommended a repeat colonoscopy in 5 years.  She is overdue for this.  However she declines that I scheduled this today.  She wants to wait on this.  He is not sure that she wants any more colonoscopies.  She is due for a mammogram.  She also due for bone density test.  She would like me to schedule this.  Her blood pressure today is excellent. Past Medical History:  Diagnosis Date   Allergy    Anxiety    Arthritis    Asthma    Bursitis    Cataract    COPD (chronic obstructive pulmonary disease) (HCC)    Depression    Hiatal hernia    Hypercholesteremia    Hypertension    Type 2 diabetes mellitus (HCC)    Ulcer    Past Surgical History:  Procedure Laterality Date   ABDOMINAL HYSTERECTOMY     ANKLE ARTHROSCOPY WITH DRILLING/MICROFRACTURE Left 10/12/2015   Procedure: LEFT ANKLE ARTHROSCOPY, REMOVAL OF OSTEOCHONDRAL LESION WITH MICROFRACTURE;  Surgeon: Vivi Barrack, DPM;  Location: [facility];  Service: Podiatry;  Laterality: Left;   BIOPSY  10/24/2016   Procedure: BIOPSY;  Surgeon: Corbin Ade, MD;  [facility];  Service: Endoscopy;;  gastric esophageal   CATARACT EXTRACTION [facility]  10/16/2010   Procedure: CATARACT EXTRACTION PHACO AND INTRAOCULAR LENS PLACEMENT (IOC);  Surgeon: Gemma Payor;  Location: [facility];  Service: Ophthalmology;  Laterality: Left;  CDE: 6.99   CATARACT EXTRACTION W/PHACO   12/18/2010   Procedure: CATARACT EXTRACTION PHACO AND INTRAOCULAR LENS PLACEMENT (IOC);  Surgeon: Gemma Payor;  Location: [facility];  Service: Ophthalmology;  Laterality: Right;  CDE 12.50   COLONOSCOPY     about 5 years ago/[facility]   COLONOSCOPY WITH PROPOFOL N/A 10/24/2016   Procedure: COLONOSCOPY WITH PROPOFOL;  Surgeon: Corbin Ade, MD;  [facility];  Service: Endoscopy;  Laterality: N/A;  215   ESOPHAGOGASTRODUODENOSCOPY  2003   Gastritis, path unavailable.   ESOPHAGOGASTRODUODENOSCOPY (EGD) WITH PROPOFOL N/A 10/24/2016   Procedure: ESOPHAGOGASTRODUODENOSCOPY (EGD) WITH PROPOFOL;  Surgeon: Corbin Ade, MD;  [facility];  Service: Endoscopy;  Laterality: N/A;   EYE SURGERY     fracture lower back     left elbow tendon repair     NEUROPLASTY / TRANSPOSITION MEDIAN NERVE AT CARPAL TUNNEL BILATERAL     plantar fasc bilateral     right rotator cuff     rt foot reconstruction     trigger thumb     Left   Current Outpatient Medications on File Prior to Visit  Medication Sig Dispense Refill   ACCU-CHEK GUIDE test strip TEST BLOOD SUGAR TWO TIMES DAILY AS DIRECTED 200 strip 3   Accu-Chek Softclix Lancets lancets Use as directed to monitor  FSBS 2x daily. Dx: E11.9 200 each 3   albuterol (VENTOLIN HFA) 108 (90 Base) MCG/ACT inhaler INHALE 1 TO 2 PUFFS EVERY 6 HOURS AS NEEDED FOR WHEEZING OR FOR SHORTNESS OF BREATH 1 each 10   Alcohol Swabs (DROPSAFE ALCOHOL PREP) 70 % PADS USE AS DIRECTED TO MONITOR FINGERSTICK BLOOD SUGAR TWICE DAILY 200 each 3   atorvastatin (LIPITOR) 80 MG tablet TAKE 1 TABLET EVERY DAY 90 tablet 3   Blood Glucose Calibration (ACCU-CHEK AVIVA) SOLN USE FOR GLUCOMETER COLLABORATION 1 each 0   Blood Glucose Monitoring Suppl (ACCU-CHEK AVIVA PLUS) w/Device KIT Use as directed to monitor FSBS 2x daily. Dx: E11.9 1 kit 1   Budeson-Glycopyrrol-Formoterol (BREZTRI AEROSPHERE) 160-9-4.8 MCG/ACT AERO Inhale 2 puffs into the lungs 2 (two) times daily. 10.7 g 11    celecoxib (CELEBREX) 200 MG capsule TAKE 1 CAPSULE EVERY DAY 90 capsule 3   dapagliflozin propanediol (FARXIGA) 10 MG TABS tablet TAKE 1 TABLET EVERY DAY BEFORE BREAKFAST 90 tablet 1   gabapentin (NEURONTIN) 300 MG capsule TAKE 1 CAPSULE THREE TIMES DAILY 360 capsule 2   levocetirizine (XYZAL) 5 MG tablet TAKE 1 TABLET EVERY EVENING 90 tablet 3   metoprolol succinate (TOPROL-XL) 25 MG 24 hr tablet TAKE 1 TABLET EVERY DAY 90 tablet 3   montelukast (SINGULAIR) 10 MG tablet TAKE 1 TABLET (10 MG TOTAL) BY MOUTH AT BEDTIME. 90 tablet 3   Polyethyl Glycol-Propyl Glycol (SYSTANE OP) Apply 1 drop to eye 3 (three) times daily.     traZODone (DESYREL) 50 MG tablet Take 3 tablets (150 mg total) by mouth at bedtime. 21 tablet 0   venlafaxine XR (EFFEXOR-XR) 75 MG 24 hr capsule TAKE 2 CAPSULES EVERY DAY 180 capsule 3   No current facility-administered medications on file prior to visit.   Allergies  Allergen Reactions   Codeine Itching   Flonase [Fluticasone Propionate] Cough    Severe coughing   Metformin And Related Diarrhea    Caused severe diarrhea and severe GI symptoms---even on low dose 500mg  BID   Social History   Socioeconomic History   Marital status: Single    Spouse name: Not on file   Number of children: 2   Years of education: Not on file   Highest education level: Not on file  Occupational History   Not on file  Tobacco Use   Smoking status: Some Days    Current packs/day: 0.50    Average packs/day: 0.5 packs/day for 52.0 years (26.0 ttl pk-yrs)    Types: Cigarettes   Smokeless tobacco: Never   Tobacco comments:    10/25/2021-declines low dose CT scan  Vaping Use   Vaping status: Never Used  Substance and Sexual Activity   Alcohol use: Yes    Comment: rare   Drug use: Yes    Frequency: 3.0 times per week    Types: Marijuana    Comment: not in a few months    Sexual activity: Not Currently    Birth control/protection: Surgical  Other Topics Concern   Not on file   Social History Narrative   2 sons, 1 son lives with patient.   2 boys 3 girls grandchildren, 3 great grandchildren.   Social Drivers of Corporate investment banker Strain: Low Risk  (10/25/2021)   Overall Financial Resource Strain (CARDIA)    Difficulty of Paying Living Expenses: Not hard at all  Food Insecurity: No Food Insecurity (10/25/2021)   Hunger Vital Sign    Worried About Running Out  of Food in the Last Year: Never true    Ran Out of Food in the Last Year: Never true  Transportation Needs: No Transportation Needs (10/25/2021)   PRAPARE - Administrator, Civil Service (Medical): No    Lack of Transportation (Non-Medical): No  Physical Activity: Inactive (10/25/2021)   Exercise Vital Sign    Days of Exercise per Week: 0 days    Minutes of Exercise per Session: 0 min  Stress: No Stress Concern Present (10/25/2021)   [employer] of Occupational Health - Occupational Stress Questionnaire    Feeling of Stress : Not at all  Social Connections: Socially Isolated (10/25/2021)   Social Connection and Isolation Panel [NHANES]    Frequency of Communication with Friends and Family: More than three times a week    Frequency of Social Gatherings with Friends and Family: More than three times a week    Attends Religious Services: Never    Database administrator or Organizations: No    Attends Banker Meetings: Never    Marital Status: Widowed  Intimate Partner Violence: Not At Risk (10/25/2021)   Humiliation, Afraid, Rape, and Kick questionnaire    Fear of Current or Ex-Partner: No    Emotionally Abused: No    Physically Abused: No    Sexually Abused: No      Review of Systems  All other systems reviewed and are negative.      Objective:   Physical Exam Vitals reviewed.  Constitutional:      General: She is not in acute distress.    Appearance: Normal appearance. She is obese. She is not ill-appearing or toxic-appearing.  HENT:     Right Ear:  Tympanic membrane and ear canal normal.     Left Ear: Tympanic membrane and ear canal normal.     Nose: Nose normal. No congestion or rhinorrhea.     Mouth/Throat:     Mouth: Mucous membranes are moist.     Pharynx: Oropharynx is clear. No oropharyngeal exudate or posterior oropharyngeal erythema.  Eyes:     Extraocular Movements: Extraocular movements intact.     Conjunctiva/sclera: Conjunctivae normal.     Pupils: Pupils are equal, round, and reactive to light.  Cardiovascular:     Rate and Rhythm: Normal rate and regular rhythm.     Pulses: Normal pulses.     Heart sounds: Murmur heard.     No friction rub. No gallop.  Pulmonary:     Effort: Pulmonary effort is normal. No respiratory distress.     Breath sounds: No stridor. No wheezing, rhonchi or rales.    Abdominal:     General: Bowel sounds are normal. There is no distension.     Palpations: Abdomen is soft.     Tenderness: There is no abdominal tenderness. There is no guarding or rebound.  Musculoskeletal:     Right lower leg: No edema.     Left lower leg: No edema.  Lymphadenopathy:     Cervical: No cervical adenopathy.  Neurological:     General: No focal deficit present.     Mental Status: She is alert and oriented to person, place, and time. Mental status is at baseline.     Cranial Nerves: No cranial nerve deficit.     Motor: No weakness.     Coordination: Coordination normal.     Gait: Gait normal.     Deep Tendon Reflexes: Reflexes normal.  Psychiatric:  Mood and Affect: Mood normal.        Behavior: Behavior normal.        Thought Content: Thought content normal.        Judgment: Judgment normal.           Assessment & Plan:  Encounter for screening mammogram for malignant neoplasm of breast - Plan: MM Digital Screening  Postmenopausal estrogen deficiency - Plan: DG Bone Density  Diabetes mellitus without complication (HCC) - Plan: CBC with Differential/Platelet, COMPLETE METABOLIC PANEL WITH  GFR, Lipid panel, Hemoglobin A1c, Microalbumin/Creatinine Ratio, Urine  Mitral valve insufficiency, unspecified etiology - Plan: ECHOCARDIOGRAM COMPLETE  Primary hypertension  Pulmonary emphysema, unspecified emphysema type (HCC)  General medical exam Patient received a flu shot today along with Capvaxive.  She declines COVID, RSV, and Shingrix.  I will schedule her for a mammogram as well as a bone density as these are overdue.  Due to her age she does not require Pap smear.  She is due for a colonoscopy but she declines this.  Check CBC CMP lipid panel A1c and urine protein to creatinine ratio.  I would like to see her LDL below 100 and her A1c below 6.5.  The murmur due to her mitral insufficiency has worsened.  Therefore I will repeat an echocardiogram to determine if she needs see cardiology regarding this.  Otherwise she is doing well aside from her chronic polyarthralgias

## 2023-01-17 NOTE — Addendum Note (Signed)
Addended by: Venia Carbon K on: 01/17/2023 11:37 AM   Modules accepted: Orders

## 2023-01-18 LAB — COMPLETE METABOLIC PANEL WITH GFR
AG Ratio: 1.4 (calc) (ref 1.0–2.5)
ALT: 19 U/L (ref 6–29)
AST: 24 U/L (ref 10–35)
Albumin: 4.2 g/dL (ref 3.6–5.1)
Alkaline phosphatase (APISO): 133 U/L (ref 37–153)
BUN/Creatinine Ratio: 26 (calc) — ABNORMAL HIGH (ref 6–22)
BUN: 29 mg/dL — ABNORMAL HIGH (ref 7–25)
CO2: 26 mmol/L (ref 20–32)
Calcium: 9.4 mg/dL (ref 8.6–10.4)
Chloride: 103 mmol/L (ref 98–110)
Creat: 1.11 mg/dL — ABNORMAL HIGH (ref 0.60–1.00)
Globulin: 2.9 g/dL (ref 1.9–3.7)
Glucose, Bld: 89 mg/dL (ref 65–99)
Potassium: 4.8 mmol/L (ref 3.5–5.3)
Sodium: 139 mmol/L (ref 135–146)
Total Bilirubin: 0.3 mg/dL (ref 0.2–1.2)
Total Protein: 7.1 g/dL (ref 6.1–8.1)
eGFR: 52 mL/min/{1.73_m2} — ABNORMAL LOW (ref >=60)

## 2023-01-18 LAB — CBC WITH DIFFERENTIAL/PLATELET
Absolute Lymphocytes: 3905 {cells}/uL — ABNORMAL HIGH (ref 850–3900)
Absolute Monocytes: 685 {cells}/uL (ref 200–950)
Basophils Absolute: 110 {cells}/uL (ref 0–200)
Basophils Relative: 0.8 %
Eosinophils Absolute: 233 {cells}/uL (ref 15–500)
Eosinophils Relative: 1.7 %
HCT: 42.5 % (ref 35.0–45.0)
Hemoglobin: 13.9 g/dL (ref 11.7–15.5)
MCH: 29.6 pg (ref 27.0–33.0)
MCHC: 32.7 g/dL (ref 32.0–36.0)
MCV: 90.6 fL (ref 80.0–100.0)
MPV: 10.8 fL (ref 7.5–12.5)
Monocytes Relative: 5 %
Neutro Abs: 8768 {cells}/uL — ABNORMAL HIGH (ref 1500–7800)
Neutrophils Relative %: 64 %
Platelets: 329 10*3/uL (ref 140–400)
RBC: 4.69 10*6/uL (ref 3.80–5.10)
RDW: 13.3 % (ref 11.0–15.0)
Total Lymphocyte: 28.5 %
WBC: 13.7 10*3/uL — ABNORMAL HIGH (ref 3.8–10.8)

## 2023-01-18 LAB — MICROALBUMIN / CREATININE URINE RATIO
Creatinine, Urine: 35 mg/dL (ref 20–275)
Microalb Creat Ratio: 69 mg/g{creat} — ABNORMAL HIGH (ref <30)
Microalb, Ur: 2.4 mg/dL

## 2023-01-18 LAB — HEMOGLOBIN A1C
Hgb A1c MFr Bld: 6 %{Hb} — ABNORMAL HIGH (ref <5.7)
Mean Plasma Glucose: 126 mg/dL
eAG (mmol/L): 7 mmol/L

## 2023-01-18 LAB — LIPID PANEL
Cholesterol: 152 mg/dL (ref <200)
HDL: 57 mg/dL (ref >=50)
LDL Cholesterol (Calc): 78 mg/dL
Non-HDL Cholesterol (Calc): 95 mg/dL (ref <130)
Total CHOL/HDL Ratio: 2.7 (calc) (ref <5.0)
Triglycerides: 91 mg/dL (ref <150)

## 2023-01-22 ENCOUNTER — Telehealth: Payer: Self-pay

## 2023-01-22 NOTE — Telephone Encounter (Signed)
Call pt to re-enroll for PAP on [employer] [name] and [employer],pt did not answer left a HIPAA VM + mail out pt's portion application and fax Dr portion 01/22/23.

## 2023-01-24 ENCOUNTER — Other Ambulatory Visit: Payer: Self-pay

## 2023-01-24 DIAGNOSIS — E119 Type 2 diabetes mellitus without complications: Secondary | ICD-10-CM

## 2023-01-29 NOTE — Telephone Encounter (Signed)
 Received Dr portion fax back,awaiting on pt portion to submitted  PAP to [employer].

## 2023-01-31 NOTE — Telephone Encounter (Signed)
 Following up on pt PAP [employer] [facility] and [employer] left a voice mail for pt to call back,mail pt portion of application,but has not received back from pt.

## 2023-02-20 ENCOUNTER — Telehealth: Payer: Self-pay | Admitting: Family Medicine

## 2023-02-20 NOTE — Telephone Encounter (Unsigned)
Copied from CRM [id number]. Topic: Clinical - Lab/Test Results >> Feb 20, 2023  1:33 PM Clayton Bibles wrote: Reason for CRM: [name] received the letter with her test results and was told to call the office when she received it. Please call Glee at [phone]

## 2023-02-22 NOTE — Telephone Encounter (Signed)
Following up on pt re enrolment for AZ[employer]ME pt has been APPROVED FOR 2025.

## 2023-03-15 ENCOUNTER — Ambulatory Visit (HOSPITAL_COMMUNITY): Admission: RE | Admit: 2023-03-15 | Payer: Medicare PPO | Source: Ambulatory Visit

## 2023-04-01 NOTE — Telephone Encounter (Signed)
 Per [employer] Mrs. Freddie Apley needs a new prescription fax to [facility] on [facility] fax# [phone].

## 2023-04-04 ENCOUNTER — Other Ambulatory Visit: Payer: Self-pay | Admitting: Family Medicine

## 2023-04-04 MED ORDER — DAPAGLIFLOZIN PROPANEDIOL 10 MG PO TABS: 10.0000 mg | ORAL_TABLET | Freq: Every day | ORAL | 4 refills | Status: DC

## 2023-04-18 ENCOUNTER — Telehealth: Payer: Self-pay | Admitting: Pharmacist

## 2023-04-18 MED ORDER — DAPAGLIFLOZIN PROPANEDIOL 10 MG PO TABS: 10.0000 mg | ORAL_TABLET | Freq: Every day | ORAL | 4 refills | Status: AC

## 2023-05-07 NOTE — Telephone Encounter (Signed)
 Unsuccessful outreach to patient

## 2023-05-10 ENCOUNTER — Other Ambulatory Visit: Payer: Self-pay | Admitting: Family Medicine

## 2023-05-10 NOTE — Telephone Encounter (Signed)
 Requested Prescriptions  Pending Prescriptions Disp Refills   albuterol  (VENTOLIN  HFA) 108 (90 Base) MCG/ACT inhaler [Pharmacy Med Name: Albuterol  Sulfate HFA Inhalation Aerosol Solution 108 (90 Base) MCG/ACT] 1 each 2    Sig: INHALE 1 TO 2 PUFFS EVERY 6 HOURS AS NEEDED FOR WHEEZING OR FOR SHORTNESS OF BREATH     Pulmonology:  Beta Agonists 2 Passed - 05/10/2023  5:55 PM      Passed - Last BP in normal range    BP Readings from Last 1 Encounters:  01/17/23 132/76         Passed - Last Heart Rate in normal range    Pulse Readings from Last 1 Encounters:  01/17/23 70         Passed - Valid encounter within last 12 months    Recent Outpatient Visits           3 months ago Encounter for screening mammogram for malignant neoplasm of breast   [facility] [place] Austine Lefort, MD   9 months ago Diabetes mellitus without complication [facility])   [place] [facility] Pickard, Cisco Crest, MD   1 year ago Primary hypertension   [place] [facility] Austine Lefort, MD   1 year ago Diabetes mellitus without complication [facility])    [facility] Pickard, Cisco Crest, MD

## 2023-05-12 ENCOUNTER — Other Ambulatory Visit: Payer: Self-pay | Admitting: Family Medicine

## 2023-05-13 NOTE — Telephone Encounter (Signed)
 Requested medication (s) are due for refill today: yes  Requested medication (s) are on the active medication list: yes  Last refill:  04/11/22 200 each 3 refills  Future visit scheduled: no  Notes to clinic:  item not assigned to a protocol   Requested Prescriptions  Pending Prescriptions Disp Refills   Alcohol  Swabs  (DROPSAFE ALCOHOL  PREP) 70 % PADS [Pharmacy Med Name: DropSafe Alcohol  Prep Pad 70 %] 200 each 3    Sig: USE AS DIRECTED TO MONITOR FINGERSTICK BLOOD SUGAR TWICE DAILY     Off-Protocol Failed - 05/13/2023  3:21 PM      Failed - Medication not assigned to a protocol, review manually.      Passed - Valid encounter within last 12 months    Recent Outpatient Visits           3 months ago Encounter for screening mammogram for malignant neoplasm of breast   [place] [facility] Austine Lefort, MD   9 months ago Diabetes mellitus without complication [facility])   [place] [facility] Pickard, Cisco Crest, MD   1 year ago Primary hypertension   [place] [facility] Austine Lefort, MD   1 year ago Diabetes mellitus without complication [facility])   [place] [facility] Pickard, Cisco Crest, MD

## 2023-08-01 ENCOUNTER — Other Ambulatory Visit: Payer: Self-pay | Admitting: Family Medicine

## 2023-08-15 ENCOUNTER — Encounter: Payer: Self-pay | Admitting: Family Medicine

## 2023-08-15 ENCOUNTER — Ambulatory Visit: Admitting: Family Medicine

## 2023-08-15 VITALS — BP 140/82 | HR 82 | Temp 98.0°F | Ht 65.75 in | Wt 180.4 lb

## 2023-08-15 DIAGNOSIS — I10 Essential (primary) hypertension: Secondary | ICD-10-CM | POA: Diagnosis not present

## 2023-08-15 DIAGNOSIS — E7800 Pure hypercholesterolemia, unspecified: Secondary | ICD-10-CM

## 2023-08-15 DIAGNOSIS — J439 Emphysema, unspecified: Secondary | ICD-10-CM | POA: Diagnosis not present

## 2023-08-15 DIAGNOSIS — I340 Nonrheumatic mitral (valve) insufficiency: Secondary | ICD-10-CM

## 2023-08-15 DIAGNOSIS — E119 Type 2 diabetes mellitus without complications: Secondary | ICD-10-CM

## 2023-08-15 DIAGNOSIS — Z7984 Long term (current) use of oral hypoglycemic drugs: Secondary | ICD-10-CM | POA: Diagnosis not present

## 2023-08-15 MED ORDER — PREDNISONE 20 MG PO TABS: ORAL_TABLET | ORAL | 0 refills | Status: DC

## 2023-08-15 MED ORDER — BREZTRI AEROSPHERE 160-9-4.8 MCG/ACT IN AERO
2.0000 | INHALATION_SPRAY | Freq: Two times a day (BID) | RESPIRATORY_TRACT | 11 refills | Status: DC
Start: 2023-08-15 — End: 2023-08-27

## 2023-08-15 MED ORDER — TRIAMCINOLONE ACETONIDE 55 MCG/ACT NA AERO: 2.0000 | INHALATION_SPRAY | Freq: Every day | NASAL | 12 refills | Status: AC

## 2023-08-15 MED ORDER — TRAZODONE HCL 50 MG PO TABS: 150.0000 mg | ORAL_TABLET | Freq: Every day | ORAL | 3 refills | Status: AC

## 2023-08-15 NOTE — Progress Notes (Signed)
 Sample of Breztri  given. Lot [id number] exp 11/21/2025. Mjp,lpn

## 2023-08-15 NOTE — Progress Notes (Signed)
 Subjective:    Patient ID: [name], female    DOB: 03-29-1945, 78 y.o.   MRN: [id number]  Patient continues to report diffuse pain all over her body.  It is in her shoulders, her back, her hips, her legs.  It is not limited to any specific joint.  She reports weakness and pain all the time.  She is currently on gabapentin  but she sees very little benefit from that.  She also takes Celebrex  but sees very little benefit from that.  She is on maximum dose atorvastatin .  She does not recall if we have tried stopping that to see if the muscle pain could be related to her statin.  She denies any fevers or chills.  She has been complaining of diffuse pain for years.  I suspect fibromyalgia.  She is due to recheck fasting lab work today.  She has been off her breztri  for several weeks.  She reports wheezing and shortness of breath. Past Medical History:  Diagnosis Date   Allergy    Anxiety    Arthritis    Asthma    Bursitis    Cataract    COPD (chronic obstructive pulmonary disease) (HCC)    Depression    Hiatal hernia    Hypercholesteremia    Hypertension    Type 2 diabetes mellitus (HCC)    Ulcer    Past Surgical History:  Procedure Laterality Date   ABDOMINAL HYSTERECTOMY     ANKLE ARTHROSCOPY WITH DRILLING/MICROFRACTURE Left 10/12/2015   Procedure: LEFT ANKLE ARTHROSCOPY, REMOVAL OF OSTEOCHONDRAL LESION WITH MICROFRACTURE;  Surgeon: Donnice JONELLE Fees, DPM;  Location: [facility];  Service: Podiatry;  Laterality: Left;   BIOPSY  10/24/2016   Procedure: BIOPSY;  Surgeon: Shaaron Lamar HERO, MD;  [facility];  Service: Endoscopy;;  gastric esophageal   CATARACT EXTRACTION [facility]  10/16/2010   Procedure: CATARACT EXTRACTION PHACO AND INTRAOCULAR LENS PLACEMENT (IOC);  Surgeon: Cherene Mania;  Location: [facility];  Service: Ophthalmology;  Laterality: Left;  CDE: 6.99   CATARACT EXTRACTION W/PHACO  12/18/2010   Procedure: CATARACT EXTRACTION PHACO AND INTRAOCULAR LENS  PLACEMENT (IOC);  Surgeon: Cherene Mania;  Location: [facility];  Service: Ophthalmology;  Laterality: Right;  CDE 12.50   COLONOSCOPY     about 5 years ago/[facility]   COLONOSCOPY WITH PROPOFOL  N/A 10/24/2016   Procedure: COLONOSCOPY WITH PROPOFOL ;  Surgeon: Shaaron Lamar HERO, MD;  [facility];  Service: Endoscopy;  Laterality: N/A;  215   ESOPHAGOGASTRODUODENOSCOPY  2003   Gastritis, path unavailable.   ESOPHAGOGASTRODUODENOSCOPY (EGD) WITH PROPOFOL  N/A 10/24/2016   Procedure: ESOPHAGOGASTRODUODENOSCOPY (EGD) WITH PROPOFOL ;  Surgeon: Shaaron Lamar HERO, MD;  [facility];  Service: Endoscopy;  Laterality: N/A;   EYE SURGERY     fracture lower back     left elbow tendon repair     NEUROPLASTY / TRANSPOSITION MEDIAN NERVE AT CARPAL TUNNEL BILATERAL     plantar fasc bilateral     right rotator cuff     rt foot reconstruction     trigger thumb     Left   Current Outpatient Medications on File Prior to Visit  Medication Sig Dispense Refill   ACCU-CHEK GUIDE test strip TEST BLOOD SUGAR TWO TIMES DAILY AS DIRECTED 200 strip 3   Accu-Chek Softclix Lancets lancets Use as directed to monitor FSBS 2x daily. Dx: E11.9 200 each 3   albuterol  (VENTOLIN  HFA) 108 (90 Base) MCG/ACT inhaler INHALE 1 TO 2  PUFFS EVERY 6 HOURS AS NEEDED FOR WHEEZING OR FOR SHORTNESS OF BREATH 1 each 2   Alcohol  Swabs  (DROPSAFE ALCOHOL  PREP) 70 % PADS USE AS DIRECTED TO MONITOR FINGERSTICK BLOOD SUGAR TWICE DAILY 200 each 3   atorvastatin  (LIPITOR) 80 MG tablet TAKE 1 TABLET EVERY DAY 90 tablet 3   Blood Glucose Calibration (ACCU-CHEK AVIVA) SOLN USE FOR GLUCOMETER COLLABORATION 1 each 0   Blood Glucose Monitoring Suppl (ACCU-CHEK AVIVA PLUS) w/Device KIT Use as directed to monitor FSBS 2x daily. Dx: E11.9 1 kit 1   Budeson-Glycopyrrol-Formoterol  (BREZTRI  AEROSPHERE) 160-9-4.8 MCG/ACT AERO Inhale 2 puffs into the lungs 2 (two) times daily. 10.7 g 11   celecoxib  (CELEBREX ) 200 MG capsule TAKE 1 CAPSULE EVERY DAY 90  capsule 3   dapagliflozin  propanediol (FARXIGA ) 10 MG TABS tablet Take 1 tablet (10 mg total) by mouth daily. 90 tablet 4   gabapentin  (NEURONTIN ) 300 MG capsule TAKE 1 CAPSULE THREE TIMES DAILY 360 capsule 2   levocetirizine (XYZAL ) 5 MG tablet TAKE 1 TABLET EVERY EVENING 90 tablet 3   metoprolol  succinate (TOPROL -XL) 25 MG 24 hr tablet TAKE 1 TABLET EVERY DAY 90 tablet 3   montelukast  (SINGULAIR ) 10 MG tablet TAKE 1 TABLET (10 MG TOTAL) BY MOUTH AT BEDTIME. 90 tablet 3   Polyethyl Glycol-Propyl Glycol (SYSTANE OP) Apply 1 drop to eye 3 (three) times daily.     traZODone  (DESYREL ) 50 MG tablet Take 3 tablets (150 mg total) by mouth at bedtime. 21 tablet 0   venlafaxine  XR (EFFEXOR -XR) 75 MG 24 hr capsule TAKE 2 CAPSULES EVERY DAY 180 capsule 3   No current facility-administered medications on file prior to visit.   Allergies  Allergen Reactions   Codeine Itching   Flonase  [Fluticasone  Propionate] Cough    Severe coughing   Metformin  And Related Diarrhea    Caused severe diarrhea and severe GI symptoms---even on low dose 500mg  BID   Social History   Socioeconomic History   Marital status: Single    Spouse name: Not on file   Number of children: 2   Years of education: Not on file   Highest education level: Not on file  Occupational History   Not on file  Tobacco Use   Smoking status: Some Days    Current packs/day: 0.50    Average packs/day: 0.5 packs/day for 52.0 years (26.0 ttl pk-yrs)    Types: Cigarettes   Smokeless tobacco: Never   Tobacco comments:    10/25/2021-declines low dose CT scan  Vaping Use   Vaping status: Never Used  Substance and Sexual Activity   Alcohol  use: Yes    Comment: rare   Drug use: Yes    Frequency: 3.0 times per week    Types: Marijuana    Comment: not in a few months    Sexual activity: Not Currently    Birth control/protection: Surgical  Other Topics Concern   Not on file  Social History Narrative   2 sons, 1 son lives with patient.    2 boys 3 girls grandchildren, 3 great grandchildren.   Social Drivers of Corporate investment banker Strain: Low Risk  (10/25/2021)   Overall Financial Resource Strain (CARDIA)    Difficulty of Paying Living Expenses: Not hard at all  Food Insecurity: No Food Insecurity (10/25/2021)   Hunger Vital Sign    Worried About Running Out of Food in the Last Year: Never true    Ran Out of Food in the Last Year: Never  true  Transportation Needs: No Transportation Needs (10/25/2021)   PRAPARE - Administrator, Civil Service (Medical): No    Lack of Transportation (Non-Medical): No  Physical Activity: Inactive (10/25/2021)   Exercise Vital Sign    Days of Exercise per Week: 0 days    Minutes of Exercise per Session: 0 min  Stress: No Stress Concern Present (10/25/2021)   [employer] of Occupational Health - Occupational Stress Questionnaire    Feeling of Stress : Not at all  Social Connections: Socially Isolated (10/25/2021)   Social Connection and Isolation Panel    Frequency of Communication with Friends and Family: More than three times a week    Frequency of Social Gatherings with Friends and Family: More than three times a week    Attends Religious Services: Never    Database administrator or Organizations: No    Attends Banker Meetings: Never    Marital Status: Widowed  Intimate Partner Violence: Not At Risk (10/25/2021)   Humiliation, Afraid, Rape, and Kick questionnaire    Fear of Current or Ex-Partner: No    Emotionally Abused: No    Physically Abused: No    Sexually Abused: No      Review of Systems  All other systems reviewed and are negative.      Objective:   Physical Exam Vitals reviewed.  Constitutional:      General: She is not in acute distress.    Appearance: Normal appearance. She is obese. She is not ill-appearing or toxic-appearing.  HENT:     Right Ear: Tympanic membrane and ear canal normal.     Left Ear: Tympanic membrane  and ear canal normal.     Nose: Nose normal.  Cardiovascular:     Rate and Rhythm: Normal rate and regular rhythm.     Heart sounds: Murmur heard.  Pulmonary:     Effort: Pulmonary effort is normal. No respiratory distress.     Breath sounds: No stridor. Wheezing present. No rhonchi or rales.  Musculoskeletal:     Right lower leg: No edema.     Left lower leg: No edema.  Lymphadenopathy:     Cervical: No cervical adenopathy.  Neurological:     Mental Status: She is alert.           Assessment & Plan:  Diabetes mellitus without complication (HCC) - Plan: Hemoglobin A1c, CBC with Differential/Platelet, Comprehensive metabolic panel with GFR, Lipid panel, Microalbumin/Creatinine Ratio, Urine  Primary hypertension - Plan: Hemoglobin A1c, CBC with Differential/Platelet, Comprehensive metabolic panel with GFR, Lipid panel, Microalbumin/Creatinine Ratio, Urine  Mitral valve insufficiency, unspecified etiology  Pure hypercholesterolemia  Pulmonary emphysema, unspecified emphysema type (HCC) - Plan: budesonide -glycopyrrolate -formoterol  (BREZTRI  AEROSPHERE) 160-9-4.8 MCG/ACT AERO inhaler Patient is currently having a COPD exacerbation.  I will start the patient on prednisone  for 6 days and I refilled her maintenance inhaler.  Her blood pressure today is acceptable.  I will check a CBC a CMP fasting lipid panel and a hemoglobin A1c.  She is currently on Farxiga  for diabetes.  Her hemoglobin A1c has been well-controlled in the past.  I would like to see her LDL cholesterol below 100.  I did ask the patient to stop atorvastatin  for 3 weeks to see if the muscle pain will improve.  If it does not, the neck step would be to add a muscle relaxer such as tizanidine for pain from what I presume is fibromyalgia

## 2023-08-16 ENCOUNTER — Ambulatory Visit: Payer: Self-pay | Admitting: Family Medicine

## 2023-08-16 LAB — COMPREHENSIVE METABOLIC PANEL WITH GFR
AG Ratio: 1.6 (calc) (ref 1.0–2.5)
ALT: 23 U/L (ref 6–29)
AST: 26 U/L (ref 10–35)
Albumin: 4.4 g/dL (ref 3.6–5.1)
Alkaline phosphatase (APISO): 126 U/L (ref 37–153)
BUN/Creatinine Ratio: 29 (calc) — ABNORMAL HIGH (ref 6–22)
BUN: 35 mg/dL — ABNORMAL HIGH (ref 7–25)
CO2: 26 mmol/L (ref 20–32)
Calcium: 9.6 mg/dL (ref 8.6–10.4)
Chloride: 102 mmol/L (ref 98–110)
Creat: 1.2 mg/dL — ABNORMAL HIGH (ref 0.60–1.00)
Globulin: 2.7 g/dL (ref 1.9–3.7)
Glucose, Bld: 116 mg/dL — ABNORMAL HIGH (ref 65–99)
Potassium: 5.4 mmol/L — ABNORMAL HIGH (ref 3.5–5.3)
Sodium: 139 mmol/L (ref 135–146)
Total Bilirubin: 0.3 mg/dL (ref 0.2–1.2)
Total Protein: 7.1 g/dL (ref 6.1–8.1)
eGFR: 47 mL/min/1.73m2 — ABNORMAL LOW (ref >=60)

## 2023-08-16 LAB — MICROALBUMIN / CREATININE URINE RATIO
Creatinine, Urine: 69 mg/dL (ref 20–275)
Microalb Creat Ratio: 97 mg/g{creat} — ABNORMAL HIGH (ref <30)
Microalb, Ur: 6.7 mg/dL

## 2023-08-16 LAB — CBC WITH DIFFERENTIAL/PLATELET
Absolute Lymphocytes: 3586 {cells}/uL (ref 850–3900)
Absolute Monocytes: 671 {cells}/uL (ref 200–950)
Basophils Absolute: 116 {cells}/uL (ref 0–200)
Basophils Relative: 0.9 %
Eosinophils Absolute: 335 {cells}/uL (ref 15–500)
Eosinophils Relative: 2.6 %
HCT: 45 % (ref 35.0–45.0)
Hemoglobin: 14.3 g/dL (ref 11.7–15.5)
MCH: 30 pg (ref 27.0–33.0)
MCHC: 31.8 g/dL — ABNORMAL LOW (ref 32.0–36.0)
MCV: 94.3 fL (ref 80.0–100.0)
MPV: 10.9 fL (ref 7.5–12.5)
Monocytes Relative: 5.2 %
Neutro Abs: 8192 {cells}/uL — ABNORMAL HIGH (ref 1500–7800)
Neutrophils Relative %: 63.5 %
Platelets: 305 Thousand/uL (ref 140–400)
RBC: 4.77 Million/uL (ref 3.80–5.10)
RDW: 12.3 % (ref 11.0–15.0)
Total Lymphocyte: 27.8 %
WBC: 12.9 Thousand/uL — ABNORMAL HIGH (ref 3.8–10.8)

## 2023-08-16 LAB — HEMOGLOBIN A1C
Hgb A1c MFr Bld: 5.9 % — ABNORMAL HIGH (ref <5.7)
Mean Plasma Glucose: 123 mg/dL
eAG (mmol/L): 6.8 mmol/L

## 2023-08-16 LAB — LIPID PANEL
Cholesterol: 149 mg/dL (ref <200)
HDL: 58 mg/dL (ref >=50)
LDL Cholesterol (Calc): 71 mg/dL
Non-HDL Cholesterol (Calc): 91 mg/dL (ref <130)
Total CHOL/HDL Ratio: 2.6 (calc) (ref <5.0)
Triglycerides: 118 mg/dL (ref <150)

## 2023-08-26 ENCOUNTER — Telehealth: Payer: Self-pay

## 2023-08-26 ENCOUNTER — Other Ambulatory Visit: Payer: Self-pay

## 2023-08-26 DIAGNOSIS — R0602 Shortness of breath: Secondary | ICD-10-CM

## 2023-08-26 DIAGNOSIS — J439 Emphysema, unspecified: Secondary | ICD-10-CM

## 2023-08-26 NOTE — Telephone Encounter (Signed)
 Copied from CRM [id number]. Topic: Clinical - Prescription Issue >> Aug 26, 2023  3:06 PM Pinkey ORN wrote: Reason for CRM: albuterol  (VENTOLIN  HFA) 108 [phone] Base) MCG/ACT inhaler >> Aug 26, 2023  3:06 PM Pinkey ORN wrote: Patient states she never received her albuterol  (VENTOLIN  HFA) 108 (90 Base) MCG/ACT inhaler

## 2023-08-27 ENCOUNTER — Other Ambulatory Visit: Payer: Self-pay

## 2023-08-27 DIAGNOSIS — J439 Emphysema, unspecified: Secondary | ICD-10-CM

## 2023-08-27 MED ORDER — BREZTRI AEROSPHERE 160-9-4.8 MCG/ACT IN AERO: 2.0000 | INHALATION_SPRAY | Freq: Two times a day (BID) | RESPIRATORY_TRACT | 11 refills | Status: AC

## 2023-08-27 MED ORDER — ALBUTEROL SULFATE HFA 108 (90 BASE) MCG/ACT IN AERS
1.0000 | INHALATION_SPRAY | Freq: Four times a day (QID) | RESPIRATORY_TRACT | 2 refills | Status: DC | PRN
Start: 2023-08-27 — End: 2023-10-04

## 2023-09-17 ENCOUNTER — Other Ambulatory Visit: Payer: Self-pay | Admitting: Family Medicine

## 2023-09-17 DIAGNOSIS — J439 Emphysema, unspecified: Secondary | ICD-10-CM

## 2023-09-17 NOTE — Telephone Encounter (Unsigned)
 Copied from CRM [id number]. Topic: Clinical - Medication Refill >> Sep 17, 2023  3:31 PM Lauren C wrote: Medication: budesonide -glycopyrrolate -formoterol  (BREZTRI  AEROSPHERE) 160-9-4.8 MCG/ACT AERO inhaler  Has the patient contacted their pharmacy? Yes (Agent: If no, request that the patient contact the pharmacy for the refill. If patient does not wish to contact the pharmacy document the reason why and proceed with request.) (Agent: If yes, when and what did the pharmacy advise?)  The prescription needs to be sent to new location listed below.  This is the patient's preferred pharmacy:  Company that makes the bretztri, the [employer] prescription program  FAX: [phone]  Is this the correct pharmacy for this prescription? Yes If no, delete pharmacy and type the correct one.   Has the prescription been filled recently? Yes  Is the patient out of the medication? Yes  Has the patient been seen for an appointment in the last year OR does the patient have an upcoming appointment? Yes  Can we respond through MyChart? No  Agent: Please be advised that Rx refills may take up to 3 business days. We ask that you follow-up with your pharmacy.

## 2023-09-19 NOTE — Telephone Encounter (Signed)
Forwarding to office.

## 2023-09-24 ENCOUNTER — Telehealth: Payer: Self-pay

## 2023-09-24 ENCOUNTER — Other Ambulatory Visit: Payer: Self-pay

## 2023-09-24 DIAGNOSIS — J439 Emphysema, unspecified: Secondary | ICD-10-CM

## 2023-09-24 NOTE — Telephone Encounter (Signed)
 Copied from CRM [id number]. Topic: Clinical - Medication Refill >> Sep 17, 2023  3:31 PM Lauren C wrote: Medication: budesonide -glycopyrrolate -formoterol  (BREZTRI  AEROSPHERE) 160-9-4.8 MCG/ACT AERO inhaler  Has the patient contacted their pharmacy? Yes (Agent: If no, request that the patient contact the pharmacy for the refill. If patient does not wish to contact the pharmacy document the reason why and proceed with request.) (Agent: If yes, when and what did the pharmacy advise?)  The prescription needs to be sent to new location listed below.  This is the patient's preferred pharmacy:  Company that makes the bretztri, the [employer] prescription program  FAX: [phone]  Is this the correct pharmacy for this prescription? Yes If no, delete pharmacy and type the correct one.   Has the prescription been filled recently? Yes  Is the patient out of the medication? Yes  Has the patient been seen for an appointment in the last year OR does the patient have an upcoming appointment? Yes  Can we respond through MyChart? No  Agent: Please be advised that Rx refills may take up to 3 business days. We ask that you follow-up with your pharmacy. >> Sep 24, 2023  8:53 AM Gustabo D wrote: Patient wants to speak to nurse about a prescription. Please call her back today she called about this previously and never called

## 2023-10-04 ENCOUNTER — Encounter: Payer: Self-pay | Admitting: Family Medicine

## 2023-10-04 ENCOUNTER — Ambulatory Visit: Admitting: Family Medicine

## 2023-10-04 DIAGNOSIS — J439 Emphysema, unspecified: Secondary | ICD-10-CM

## 2023-10-04 MED ORDER — ALBUTEROL SULFATE HFA 108 (90 BASE) MCG/ACT IN AERS
1.0000 | INHALATION_SPRAY | Freq: Four times a day (QID) | RESPIRATORY_TRACT | 5 refills | Status: AC | PRN
Start: 2023-10-04 — End: ?

## 2023-10-04 MED ORDER — BUDESONIDE-FORMOTEROL FUMARATE 160-4.5 MCG/ACT IN AERO: 2.0000 | INHALATION_SPRAY | Freq: Two times a day (BID) | RESPIRATORY_TRACT | 3 refills | Status: DC

## 2023-10-04 MED ORDER — PREDNISONE 20 MG PO TABS: ORAL_TABLET | ORAL | 0 refills | Status: DC

## 2023-10-04 NOTE — Progress Notes (Signed)
 Subjective:    Patient ID: [name], female    DOB: [birth year], 78 y.o.   MRN: [id number]  Patient has a history of COPD currently managed with breztri  2 puffs twice daily.  She has been out of the medication now for several weeks.  She reports increasing shortness of breath.  She reports wheezing.  She reports a cough productive of green sputum.  She been using her rescue inhaler with minimal relief.  She denies any fevers or chills.  Her insurance will cover Symbicort . Past Medical History:  Diagnosis Date   Allergy    Anxiety    Arthritis    Asthma    Bursitis    Cataract    COPD (chronic obstructive pulmonary disease) (HCC)    Depression    Hiatal hernia    Hypercholesteremia    Hypertension    Type 2 diabetes mellitus (HCC)    Ulcer    Past Surgical History:  Procedure Laterality Date   ABDOMINAL HYSTERECTOMY     ANKLE ARTHROSCOPY WITH DRILLING/MICROFRACTURE Left 10/12/2015   Procedure: LEFT ANKLE ARTHROSCOPY, REMOVAL OF OSTEOCHONDRAL LESION WITH MICROFRACTURE;  Surgeon: Donnice JONELLE Fees, DPM;  Location: [facility];  Service: Podiatry;  Laterality: Left;   BIOPSY  10/24/2016   Procedure: BIOPSY;  Surgeon: Shaaron Lamar HERO, MD;  [facility];  Service: Endoscopy;;  gastric esophageal   CATARACT EXTRACTION [facility]  10/16/2010   Procedure: CATARACT EXTRACTION PHACO AND INTRAOCULAR LENS PLACEMENT (IOC);  Surgeon: Cherene Mania;  Location: [facility];  Service: Ophthalmology;  Laterality: Left;  CDE: 6.99   CATARACT EXTRACTION W/PHACO  12/18/2010   Procedure: CATARACT EXTRACTION PHACO AND INTRAOCULAR LENS PLACEMENT (IOC);  Surgeon: Cherene Mania;  Location: [facility];  Service: Ophthalmology;  Laterality: Right;  CDE 12.50   COLONOSCOPY     about 5 years ago/[facility]   COLONOSCOPY WITH PROPOFOL  N/A 10/24/2016   Procedure: COLONOSCOPY WITH PROPOFOL ;  Surgeon: Shaaron Lamar HERO, MD;  [facility];  Service: Endoscopy;  Laterality: N/A;  215    ESOPHAGOGASTRODUODENOSCOPY  2003   Gastritis, path unavailable.   ESOPHAGOGASTRODUODENOSCOPY (EGD) WITH PROPOFOL  N/A 10/24/2016   Procedure: ESOPHAGOGASTRODUODENOSCOPY (EGD) WITH PROPOFOL ;  Surgeon: Shaaron Lamar HERO, MD;  [facility];  Service: Endoscopy;  Laterality: N/A;   EYE SURGERY     fracture lower back     left elbow tendon repair     NEUROPLASTY / TRANSPOSITION MEDIAN NERVE AT CARPAL TUNNEL BILATERAL     plantar fasc bilateral     right rotator cuff     rt foot reconstruction     trigger thumb     Left   Current Outpatient Medications on File Prior to Visit  Medication Sig Dispense Refill   ACCU-CHEK GUIDE test strip TEST BLOOD SUGAR TWO TIMES DAILY AS DIRECTED 200 strip 3   Accu-Chek Softclix Lancets lancets Use as directed to monitor FSBS 2x daily. Dx: E11.9 200 each 3   albuterol  (VENTOLIN  HFA) 108 (90 Base) MCG/ACT inhaler Inhale 1-2 puffs into the lungs every 6 (six) hours as needed for wheezing or shortness of breath. 1 each 2   Alcohol  Swabs  (DROPSAFE ALCOHOL  PREP) 70 % PADS USE AS DIRECTED TO MONITOR FINGERSTICK BLOOD SUGAR TWICE DAILY 200 each 3   atorvastatin  (LIPITOR) 80 MG tablet TAKE 1 TABLET EVERY DAY 90 tablet 3   Blood Glucose Calibration (ACCU-CHEK AVIVA) SOLN USE FOR GLUCOMETER COLLABORATION 1 each 0   Blood Glucose Monitoring Suppl (  ACCU-CHEK AVIVA PLUS) w/Device KIT Use as directed to monitor FSBS 2x daily. Dx: E11.9 1 kit 1   budesonide -glycopyrrolate -formoterol  (BREZTRI  AEROSPHERE) 160-9-4.8 MCG/ACT AERO inhaler Inhale 2 puffs into the lungs 2 (two) times daily. 10.7 g 11   celecoxib  (CELEBREX ) 200 MG capsule TAKE 1 CAPSULE EVERY DAY 90 capsule 3   dapagliflozin  propanediol (FARXIGA ) 10 MG TABS tablet Take 1 tablet (10 mg total) by mouth daily. 90 tablet 4   gabapentin  (NEURONTIN ) 300 MG capsule TAKE 1 CAPSULE THREE TIMES DAILY 360 capsule 2   levocetirizine (XYZAL ) 5 MG tablet TAKE 1 TABLET EVERY EVENING 90 tablet 3   metoprolol  succinate  (TOPROL -XL) 25 MG 24 hr tablet TAKE 1 TABLET EVERY DAY 90 tablet 3   montelukast  (SINGULAIR ) 10 MG tablet TAKE 1 TABLET (10 MG TOTAL) BY MOUTH AT BEDTIME. 90 tablet 3   Polyethyl Glycol-Propyl Glycol (SYSTANE OP) Apply 1 drop to eye 3 (three) times daily.     predniSONE  (DELTASONE ) 20 MG tablet 3 tabs poqday 1-2, 2 tabs poqday 3-4, 1 tab poqday 5-6 12 tablet 0   traZODone  (DESYREL ) 50 MG tablet Take 3 tablets (150 mg total) by mouth at bedtime. 270 tablet 3   triamcinolone  (NASACORT ) 55 MCG/ACT AERO nasal inhaler Place 2 sprays into the nose daily. 1 each 12   venlafaxine  XR (EFFEXOR -XR) 75 MG 24 hr capsule TAKE 2 CAPSULES EVERY DAY 180 capsule 3   No current facility-administered medications on file prior to visit.   Allergies  Allergen Reactions   Codeine Itching   Flonase  [Fluticasone  Propionate] Cough    Severe coughing   Metformin  And Related Diarrhea    Caused severe diarrhea and severe GI symptoms---even on low dose 500mg  BID   Social History   Socioeconomic History   Marital status: Single    Spouse name: Not on file   Number of children: 2   Years of education: Not on file   Highest education level: Not on file  Occupational History   Not on file  Tobacco Use   Smoking status: Some Days    Current packs/day: 0.50    Average packs/day: 0.5 packs/day for 52.0 years (26.0 ttl pk-yrs)    Types: Cigarettes   Smokeless tobacco: Never   Tobacco comments:    10/25/2021-declines low dose CT scan  Vaping Use   Vaping status: Never Used  Substance and Sexual Activity   Alcohol  use: Yes    Comment: rare   Drug use: Yes    Frequency: 3.0 times per week    Types: Marijuana    Comment: not in a few months    Sexual activity: Not Currently    Birth control/protection: Surgical  Other Topics Concern   Not on file  Social History Narrative   2 sons, 1 son lives with patient.   2 boys 3 girls grandchildren, 3 great grandchildren.   Social Drivers of Research scientist (physical sciences) Strain: Low Risk  (10/25/2021)   Overall Financial Resource Strain (CARDIA)    Difficulty of Paying Living Expenses: Not hard at all  Food Insecurity: No Food Insecurity (10/25/2021)   Hunger Vital Sign    Worried About Running Out of Food in the Last Year: Never true    Ran Out of Food in the Last Year: Never true  Transportation Needs: No Transportation Needs (10/25/2021)   PRAPARE - Administrator, Civil Service (Medical): No    Lack of Transportation (Non-Medical): No  Physical Activity: Inactive (  10/25/2021)   Exercise Vital Sign    Days of Exercise per Week: 0 days    Minutes of Exercise per Session: 0 min  Stress: No Stress Concern Present (10/25/2021)   [employer] of Occupational Health - Occupational Stress Questionnaire    Feeling of Stress : Not at all  Social Connections: Socially Isolated (10/25/2021)   Social Connection and Isolation Panel    Frequency of Communication with Friends and Family: More than three times a week    Frequency of Social Gatherings with Friends and Family: More than three times a week    Attends Religious Services: Never    Database administrator or Organizations: No    Attends Banker Meetings: Never    Marital Status: Widowed  Intimate Partner Violence: Not At Risk (10/25/2021)   Humiliation, Afraid, Rape, and Kick questionnaire    Fear of Current or Ex-Partner: No    Emotionally Abused: No    Physically Abused: No    Sexually Abused: No      Review of Systems  All other systems reviewed and are negative.      Objective:   Physical Exam Vitals reviewed.  Constitutional:      General: She is not in acute distress.    Appearance: Normal appearance. She is obese. She is not ill-appearing or toxic-appearing.  HENT:     Right Ear: Tympanic membrane and ear canal normal.     Left Ear: Tympanic membrane and ear canal normal.     Nose: Nose normal.  Cardiovascular:     Rate and Rhythm: Normal rate and  regular rhythm.     Heart sounds: Murmur heard.  Pulmonary:     Effort: Pulmonary effort is normal. No respiratory distress.     Breath sounds: No stridor. Examination of the right-upper field reveals wheezing. Examination of the left-upper field reveals wheezing. Examination of the right-lower field reveals wheezing. Examination of the left-lower field reveals wheezing. Wheezing present. No rhonchi or rales.  Musculoskeletal:     Right lower leg: No edema.     Left lower leg: No edema.  Lymphadenopathy:     Cervical: No cervical adenopathy.  Neurological:     Mental Status: She is alert.           Assessment & Plan:  Pulmonary emphysema, unspecified emphysema type (HCC) - Plan: albuterol  (VENTOLIN  HFA) 108 (90 Base) MCG/ACT inhaler Patient is having a COPD exacerbation.  Begin prednisone  taper by coupled with albuterol  2 puffs every 6 hours as needed for wheezing.  I gave the patient a sample of breztri .  She can start using 2 puffs twice a day.  My nurse will contact our pharmacy assistance program to see if we can get her on a patient assistance program for this.  However I will also send in a prescription for Symbicort  160/4.5, 2 puffs twice daily.  If the patient is unable to get breztri  moving forward, she can use this in its place to help prevent these future attacks.  She states that her muscle pain is doing better since stopping atorvastatin .  Please see my last note.  I recommended that she stay off the cholesterol medication for a total of 6 weeks and then allow us  to check a fasting lipid panel.  If her LDL cholesterol rises much higher than 130 would start the patient Zetia.  She is to return fasting and allow us  to recheck this after she has been off the medication for  6 weeks.  Presently she has stopped the medication for the last 3 weeks

## 2023-10-23 ENCOUNTER — Telehealth: Payer: Self-pay

## 2023-10-23 NOTE — Telephone Encounter (Signed)
 Copied from CRM #[id number]. Topic: General - Other >> Oct 23, 2023  1:48 PM Pinkey ORN wrote: Reason for CRM: Returning Missed Office Call >> Oct 23, 2023  1:49 PM Pinkey ORN wrote: Patient states she's returning a missed call from the office.

## 2023-10-24 NOTE — Telephone Encounter (Signed)
 The patient called back stating she missed a call. I saw where Ronal Bradley tried to call and couldn't get a hold of the patient or leave a message. I called and spoke with Nanette and she said Ronal Bradley was with a patient and needs to call her back. I went back to tell the patient and she had hung up. I tried to call her back but she did not answer. Please assist patient further

## 2023-10-28 ENCOUNTER — Telehealth: Payer: Self-pay

## 2023-10-28 NOTE — Telephone Encounter (Signed)
 PAP: Patient assistance application for Breztri  and Farxiga  through [employer] (AZ[employer]Me) has been mailed to pt's home address on file. Provider portion of application will be faxed to provider's office.

## 2023-11-05 NOTE — Telephone Encounter (Signed)
 Received provider portion for Breztri  & Farxiga  (AZ&ME)

## 2023-11-14 ENCOUNTER — Other Ambulatory Visit: Payer: Self-pay | Admitting: Family Medicine

## 2023-11-15 ENCOUNTER — Ambulatory Visit: Admitting: Family Medicine

## 2024-01-02 ENCOUNTER — Ambulatory Visit: Payer: Self-pay

## 2024-01-02 ENCOUNTER — Telehealth: Payer: Self-pay | Admitting: Family Medicine

## 2024-01-02 NOTE — Telephone Encounter (Signed)
 FYI Only or Action Required?: Action required by provider: appointment scheduled for 01/03/2024. Patient needs new RX for Breztri  sent to mfgr.  Unable to pend as updated diagnosis code is needed  Patient was last seen in primary care on 10/04/2023 by Duanne Butler DASEN, MD.  Called Nurse Triage reporting Dizziness and Shortness of Breath.  Symptoms began a week ago.  Interventions attempted: Rest, hydration, or home remedies.  Symptoms are: unchanged.  Triage Disposition: See PCP When Office is Open (Within 3 Days)  Patient/caregiver understands and will follow disposition?: Yes  Copied from CRM #[id number]. Topic: Clinical - Red Word Triage >> Jan 02, 2024 11:31 AM Tobias L wrote: Red Word that prompted transfer to Nurse Triage: not feeling well, dizzyness when standing and running out of breath Reason for Disposition  [1] MODERATE longstanding difficulty breathing (e.g., speaks in phrases, SOB even at rest, pulse 100-120) AND [2] SAME as normal  Answer Assessment - Initial Assessment Questions 1. RESPIRATORY STATUS: Describe your breathing? (e.g., wheezing, shortness of breath, unable to speak, severe coughing)      Shortness of breath with activity 2. ONSET: When did this breathing problem begin?      About a week ago 3. PATTERN Does the difficult breathing come and go, or has it been constant since it started?     No Sob with rest, only with activity 4. SEVERITY: How bad is your breathing? (e.g., mild, moderate, severe)      mild 5. RECURRENT SYMPTOM: Have you had difficulty breathing before? If Yes, ask: When was the last time? and What happened that time?      Has had SOB in the past 6. CARDIAC HISTORY: Do you have any history of heart disease? (e.g., heart attack, angina, bypass surgery, angioplasty)      HTN 7. LUNG HISTORY: Do you have any history of lung disease?  (e.g., pulmonary embolus, asthma, emphysema)     COPD 8. CAUSE: What do you think is causing  the breathing problem?      Unsure, has been sick with cold symptoms recently, dizziness and shortness of breath with activity came back 9. OTHER SYMPTOMS: Do you have any other symptoms? (e.g., chest pain, cough, dizziness, fever, runny nose)     Patient had cold symptoms  Answer Assessment - Initial Assessment Questions 1. DESCRIPTION: Describe your dizziness.     Light headed on standing.  Feels like her legs are going to give out 2. LIGHTHEADED: Do you feel lightheaded? (e.g., somewhat faint, woozy, weak upon standing)     Does not feel faint, but does feel out of breath 3. VERTIGO: Do you feel like either you or the room is spinning or tilting? (i.e., vertigo)     Denies vertigo  4. SEVERITY: How bad is it?  Do you feel like you are going to faint? Can you stand and walk?     Able to walk 5. ONSET:  When did the dizziness begin?     About one week ago 6. AGGRAVATING FACTORS: Does anything make it worse? (e.g., standing, change in head position)     Transitioning from sit to stand  8. CAUSE: What do you think is causing the dizziness? (e.g., decreased fluids or food, diarrhea, emotional distress, heat exposure, new medicine, sudden standing, vomiting; unknown)     unsure 9. RECURRENT SYMPTOM: Have you had dizziness before? If Yes, ask: When was the last time? What happened that time?     Recurrent symptom, just something that  she deals with.  States that it improved for a while, but has returned 10. OTHER SYMPTOMS: Do you have any other symptoms? (e.g., fever, chest pain, vomiting, diarrhea, bleeding)       N/a  Protocols used: Dizziness - Lightheadedness-A-AH, Breathing Difficulty-A-AH

## 2024-01-03 ENCOUNTER — Ambulatory Visit: Payer: Self-pay

## 2024-01-03 ENCOUNTER — Ambulatory Visit: Admitting: Family Medicine

## 2024-01-03 NOTE — Telephone Encounter (Signed)
 FYI Only or Action Required?: FYI only for provider: Appt rescheduled to Monday.  Patient was last seen in primary care on 10/04/2023 by Duanne Butler DASEN, MD.  Called Nurse Triage reporting Dizziness.  Symptoms began a week ago.  Interventions attempted: Rest, hydration, or home remedies.  Symptoms are: rapidly improving.  Triage Disposition: Information or Advice Only Call  Patient/caregiver understands and will follow disposition?: Yes   Copied from CRM #[id number]. Topic: Clinical - Red Word Triage >> Jan 03, 2024 12:35 PM Fonda T wrote: Pt is calling back, requesting to reschedule an appt previously scheduled by NT for dizziness, and shortness of breath. Pt would like to cancel today's appt and reschedule for next week.  Ph. [phone] Reason for Disposition  [1] Follow-up call to recent contact AND [2] information only call, no triage required  Answer Assessment - Initial Assessment Questions 1. REASON FOR CALL: What is the main reason for your call? or How can I best help you?     Reschedule appt made for today- 2. SYMPTOMS : Do you have any symptoms?      Symptoms of Dizziness and SOB relieved today 3. OTHER QUESTIONS: Do you have any other questions?     Patient is feeling great today and wanting to see how she does, appt rescheduled to Monday.  She had her first real solid BM in 3 weeks this morning and feels great. No current dizziness or SOB. Discussed CB/ED/UC precautions and understood.  Protocols used: Information Only Call - No Triage-A-AH

## 2024-01-06 ENCOUNTER — Ambulatory Visit: Admitting: Family Medicine

## 2024-01-06 ENCOUNTER — Encounter: Payer: Self-pay | Admitting: Family Medicine

## 2024-01-06 ENCOUNTER — Ambulatory Visit (HOSPITAL_COMMUNITY)
Admission: RE | Admit: 2024-01-06 | Discharge: 2024-01-06 | Disposition: A | Source: Ambulatory Visit | Attending: Family Medicine | Admitting: Family Medicine

## 2024-01-06 VITALS — BP 148/80 | HR 94 | Temp 98.0°F | Ht 65.75 in | Wt 183.6 lb

## 2024-01-06 DIAGNOSIS — R0609 Other forms of dyspnea: Secondary | ICD-10-CM

## 2024-01-06 MED ORDER — FUROSEMIDE 40 MG PO TABS: 40.0000 mg | ORAL_TABLET | Freq: Every day | ORAL | 3 refills | Status: DC

## 2024-01-06 NOTE — Progress Notes (Signed)
 Subjective:    Patient ID: [name], female    DOB: 1945-03-04, 78 y.o.   MRN: [id number]  Wt Readings from Last 3 Encounters:  01/06/24 183 lb 9.6 oz (83.3 kg)  10/04/23 188 lb 6.4 oz (85.5 kg)  08/15/23 180 lb 6.4 oz (81.8 kg)   Patient has a history of COPD.  She had an echocardiogram several years ago that showed a normal ejection fraction.  She denies any chest pain but over the last week or so she has been getting progressively short of breath.  She states she is having to stop simply walking across the kitchen to catch her breath.  She has to stop and take breaths walking in from the parking lot.  She denies any recent weight change.  There is no pitting edema in her extremities.  However there are bibasilar crackles in both lungs.  There is no expiratory wheezing.  She has slightly diminished breath sounds bilaterally but does not appear to be having a COPD exacerbation.  She does report occasional coughing but not more than usual.  There is no purulent sputum or fever or chills.  Her weight is actually down from September.  She denies angina or irregular heartbeats.  She does have a 2/6 systolic ejection murmur on exam Past Medical History:  Diagnosis Date   Allergy    Anxiety    Arthritis    Asthma    Bursitis    Cataract    COPD (chronic obstructive pulmonary disease) (HCC)    Depression    Hiatal hernia    Hypercholesteremia    Hypertension    Type 2 diabetes mellitus (HCC)    Ulcer    Past Surgical History:  Procedure Laterality Date   ABDOMINAL HYSTERECTOMY     ANKLE ARTHROSCOPY WITH DRILLING/MICROFRACTURE Left 10/12/2015   Procedure: LEFT ANKLE ARTHROSCOPY, REMOVAL OF OSTEOCHONDRAL LESION WITH MICROFRACTURE;  Surgeon: Donnice JONELLE Fees, DPM;  Location: [facility];  Service: Podiatry;  Laterality: Left;   BIOPSY  10/24/2016   Procedure: BIOPSY;  Surgeon: Shaaron Lamar HERO, MD;  [facility]: [facility];  Service: Endoscopy;;  gastric esophageal    CATARACT EXTRACTION [facility]  10/16/2010   Procedure: CATARACT EXTRACTION PHACO AND INTRAOCULAR LENS PLACEMENT (IOC);  Surgeon: Cherene Mania;  Location: [facility];  Service: Ophthalmology;  Laterality: Left;  CDE: 6.99   CATARACT EXTRACTION W/PHACO  12/18/2010   Procedure: CATARACT EXTRACTION PHACO AND INTRAOCULAR LENS PLACEMENT (IOC);  Surgeon: Cherene Mania;  Location: [facility];  Service: Ophthalmology;  Laterality: Right;  CDE 12.50   COLONOSCOPY     about 5 years ago/[facility]   COLONOSCOPY WITH PROPOFOL  N/A 10/24/2016   Procedure: COLONOSCOPY WITH PROPOFOL ;  Surgeon: Shaaron Lamar HERO, MD;  Location: [facility];  Service: Endoscopy;  Laterality: N/A;  215   ESOPHAGOGASTRODUODENOSCOPY  2003   Gastritis, path unavailable.   ESOPHAGOGASTRODUODENOSCOPY (EGD) WITH PROPOFOL  N/A 10/24/2016   Procedure: ESOPHAGOGASTRODUODENOSCOPY (EGD) WITH PROPOFOL ;  Surgeon: Shaaron Lamar HERO, MD;  Location: [facility];  Service: Endoscopy;  Laterality: N/A;   EYE SURGERY     fracture lower back     left elbow tendon repair     NEUROPLASTY / TRANSPOSITION MEDIAN NERVE AT CARPAL TUNNEL BILATERAL     plantar fasc bilateral     right rotator cuff     rt foot reconstruction     trigger thumb     Left   Current Outpatient Medications on File Prior to Visit  Medication Sig Dispense Refill   ACCU-CHEK GUIDE test strip TEST BLOOD SUGAR TWO TIMES DAILY AS DIRECTED 200 strip 3   Accu-Chek Softclix Lancets lancets Use as directed to monitor FSBS 2x daily. Dx: E11.9 200 each 3   albuterol  (VENTOLIN  HFA) 108 (90 Base) MCG/ACT inhaler Inhale 1-2 puffs into the lungs every 6 (six) hours as needed for wheezing or shortness of breath. 1 each 5   Alcohol  Swabs  (DROPSAFE ALCOHOL  PREP) 70 % PADS USE AS DIRECTED TO MONITOR FINGERSTICK BLOOD SUGAR TWICE DAILY 200 each 3   atorvastatin  (LIPITOR) 80 MG tablet TAKE 1 TABLET EVERY DAY 90 tablet 3   Blood Glucose Calibration (ACCU-CHEK AVIVA) SOLN USE FOR GLUCOMETER COLLABORATION 1 each 0   Blood  Glucose Monitoring Suppl (ACCU-CHEK AVIVA PLUS) w/Device KIT Use as directed to monitor FSBS 2x daily. Dx: E11.9 1 kit 1   budesonide -formoterol  (SYMBICORT ) 160-4.5 MCG/ACT inhaler Inhale 2 puffs into the lungs 2 (two) times daily. 1 each 3   budesonide -glycopyrrolate -formoterol  (BREZTRI  AEROSPHERE) 160-9-4.8 MCG/ACT AERO inhaler Inhale 2 puffs into the lungs 2 (two) times daily. 10.7 g 11   celecoxib  (CELEBREX ) 200 MG capsule TAKE 1 CAPSULE EVERY DAY 90 capsule 3   dapagliflozin  propanediol (FARXIGA ) 10 MG TABS tablet Take 1 tablet (10 mg total) by mouth daily. 90 tablet 4   gabapentin  (NEURONTIN ) 300 MG capsule TAKE 1 CAPSULE THREE TIMES DAILY 270 capsule 3   levocetirizine (XYZAL ) 5 MG tablet TAKE 1 TABLET EVERY EVENING 90 tablet 3   metoprolol  succinate (TOPROL -XL) 25 MG 24 hr tablet TAKE 1 TABLET EVERY DAY 90 tablet 3   montelukast  (SINGULAIR ) 10 MG tablet TAKE 1 TABLET (10 MG TOTAL) BY MOUTH AT BEDTIME. 90 tablet 3   Polyethyl Glycol-Propyl Glycol (SYSTANE OP) Apply 1 drop to eye 3 (three) times daily.     predniSONE  (DELTASONE ) 20 MG tablet 3 tabs poqday 1-2, 2 tabs poqday 3-4, 1 tab poqday 5-6 12 tablet 0   traZODone  (DESYREL ) 50 MG tablet Take 3 tablets (150 mg total) by mouth at bedtime. 270 tablet 3   triamcinolone  (NASACORT ) 55 MCG/ACT AERO nasal inhaler Place 2 sprays into the nose daily. 1 each 12   venlafaxine  XR (EFFEXOR -XR) 75 MG 24 hr capsule TAKE 2 CAPSULES EVERY DAY 180 capsule 3   No current facility-administered medications on file prior to visit.   Allergies  Allergen Reactions   Codeine Itching   Flonase  [Fluticasone  Propionate] Cough    Severe coughing   Metformin  And Related Diarrhea    Caused severe diarrhea and severe GI symptoms---even on low dose 500mg  BID   Social History   Socioeconomic History   Marital status: Single    Spouse name: Not on file   Number of children: 2   Years of education: Not on file   Highest education level: Not on file   Occupational History   Not on file  Tobacco Use   Smoking status: Some Days    Current packs/day: 0.50    Average packs/day: 0.5 packs/day for 52.0 years (26.0 ttl pk-yrs)    Types: Cigarettes   Smokeless tobacco: Never   Tobacco comments:    10/25/2021-declines low dose CT scan  Vaping Use   Vaping status: Never Used  Substance and Sexual Activity   Alcohol  use: Yes    Comment: rare   Drug use: Yes    Frequency: 3.0 times per week    Types: Marijuana    Comment: not in a few months  Sexual activity: Not Currently    Birth control/protection: Surgical  Other Topics Concern   Not on file  Social History Narrative   2 sons, 1 son lives with patient.   2 boys 3 girls grandchildren, 3 great grandchildren.   Social Drivers of Health   Tobacco Use: High Risk (10/04/2023)   Patient History    Smoking Tobacco Use: Some Days    Smokeless Tobacco Use: Never    Passive Exposure: Not on file  Financial Resource Strain: Low Risk (10/25/2021)   Overall Financial Resource Strain (CARDIA)    Difficulty of Paying Living Expenses: Not hard at all  Food Insecurity: No Food Insecurity (10/25/2021)   Hunger Vital Sign    Worried About Running Out of Food in the Last Year: Never true    Ran Out of Food in the Last Year: Never true  Transportation Needs: No Transportation Needs (10/25/2021)   PRAPARE - Administrator, Civil Service (Medical): No    Lack of Transportation (Non-Medical): No  Physical Activity: Inactive (10/25/2021)   Exercise Vital Sign    Days of Exercise per Week: 0 days    Minutes of Exercise per Session: 0 min  Stress: No Stress Concern Present (10/25/2021)   [employer] of Occupational Health - Occupational Stress Questionnaire    Feeling of Stress : Not at all  Social Connections: Socially Isolated (10/25/2021)   Social Connection and Isolation Panel    Frequency of Communication with Friends and Family: More than three times a week    Frequency of  Social Gatherings with Friends and Family: More than three times a week    Attends Religious Services: Never    Database Administrator or Organizations: No    Attends Banker Meetings: Never    Marital Status: Widowed  Intimate Partner Violence: Not At Risk (10/25/2021)   Humiliation, Afraid, Rape, and Kick questionnaire    Fear of Current or Ex-Partner: No    Emotionally Abused: No    Physically Abused: No    Sexually Abused: No  Depression (PHQ2-9): Low Risk (10/04/2023)   Depression (PHQ2-9)    PHQ-2 Score: 0  Alcohol  Screen: Low Risk (10/25/2021)   Alcohol  Screen    Last Alcohol  Screening Score (AUDIT): 0  Housing: Low Risk (10/25/2021)   Housing    Last Housing Risk Score: 0  Utilities: Not At Risk (10/25/2021)   AHC Utilities    Threatened with loss of utilities: No  Health Literacy: Not on file      Review of Systems  All other systems reviewed and are negative.      Objective:   Physical Exam Vitals reviewed.  Constitutional:      General: She is not in acute distress.    Appearance: Normal appearance. She is obese. She is not ill-appearing or toxic-appearing.  HENT:     Right Ear: Tympanic membrane and ear canal normal.     Left Ear: Tympanic membrane and ear canal normal.     Nose: Nose normal.  Cardiovascular:     Rate and Rhythm: Normal rate and regular rhythm.     Heart sounds: Murmur heard.  Pulmonary:     Effort: Pulmonary effort is normal. No respiratory distress.     Breath sounds: No stridor. Rales present. No wheezing or rhonchi.  Musculoskeletal:     Right lower leg: No edema.     Left lower leg: No edema.  Lymphadenopathy:     Cervical: No  cervical adenopathy.  Neurological:     Mental Status: She is alert.           Assessment & Plan:  Dyspnea on exertion - Plan: DG Chest 2 View, Brain natriuretic peptide, CBC with Differential/Platelet, Comprehensive metabolic panel with GFR I was anticipating COPD is the most likely  cause of her shortness of breath.  However there is significant wheezing on exam.  She does have bibasilar rails right greater than left and progressive dyspnea on exertion.  Therefore I am concerned about congestive heart failure.  I will check a chest x-ray and also a BNP.  Begin Lasix  40 mg a day.  If chest x-ray is clear and BNP is normal, I will treat the patient for COPD.  If BNP is elevated and there is evidence of heart failure, the patient would benefit from an echocardiogram and likely a catheterization.  Await results of x-rays and lab work

## 2024-01-07 ENCOUNTER — Ambulatory Visit: Payer: Self-pay | Admitting: Family Medicine

## 2024-01-07 ENCOUNTER — Other Ambulatory Visit: Payer: Self-pay

## 2024-01-07 DIAGNOSIS — R0609 Other forms of dyspnea: Secondary | ICD-10-CM

## 2024-01-07 DIAGNOSIS — J439 Emphysema, unspecified: Secondary | ICD-10-CM

## 2024-01-07 DIAGNOSIS — R0602 Shortness of breath: Secondary | ICD-10-CM

## 2024-01-07 LAB — CBC WITH DIFFERENTIAL/PLATELET
Absolute Lymphocytes: 3528 {cells}/uL (ref 850–3900)
Absolute Monocytes: 767 {cells}/uL (ref 200–950)
Basophils Absolute: 83 {cells}/uL (ref 0–200)
Basophils Relative: 0.7 %
Eosinophils Absolute: 260 {cells}/uL (ref 15–500)
Eosinophils Relative: 2.2 %
HCT: 46.8 % — ABNORMAL HIGH (ref 35.9–46.0)
Hemoglobin: 15.2 g/dL (ref 11.7–15.5)
MCH: 29.1 pg (ref 27.0–33.0)
MCHC: 32.5 g/dL (ref 31.6–35.4)
MCV: 89.7 fL (ref 81.4–101.7)
MPV: 11.8 fL (ref 7.5–12.5)
Monocytes Relative: 6.5 %
Neutro Abs: 7163 {cells}/uL (ref 1500–7800)
Neutrophils Relative %: 60.7 %
Platelets: 287 Thousand/uL (ref 140–400)
RBC: 5.22 Million/uL — ABNORMAL HIGH (ref 3.80–5.10)
RDW: 13.3 % (ref 11.0–15.0)
Total Lymphocyte: 29.9 %
WBC: 11.8 Thousand/uL — ABNORMAL HIGH (ref 3.8–10.8)

## 2024-01-07 LAB — COMPREHENSIVE METABOLIC PANEL WITH GFR
AG Ratio: 1.6 (calc) (ref 1.0–2.5)
ALT: 16 U/L (ref 6–29)
AST: 27 U/L (ref 10–35)
Albumin: 4.2 g/dL (ref 3.6–5.1)
Alkaline phosphatase (APISO): 109 U/L (ref 37–153)
BUN/Creatinine Ratio: 16 (calc) (ref 6–22)
BUN: 18 mg/dL (ref 7–25)
CO2: 30 mmol/L (ref 20–32)
Calcium: 10.1 mg/dL (ref 8.6–10.4)
Chloride: 100 mmol/L (ref 98–110)
Creat: 1.15 mg/dL — ABNORMAL HIGH (ref 0.60–1.00)
Globulin: 2.6 g/dL (ref 1.9–3.7)
Glucose, Bld: 91 mg/dL (ref 65–99)
Potassium: 5.6 mmol/L — ABNORMAL HIGH (ref 3.5–5.3)
Sodium: 140 mmol/L (ref 135–146)
Total Bilirubin: 0.3 mg/dL (ref 0.2–1.2)
Total Protein: 6.8 g/dL (ref 6.1–8.1)
eGFR: 49 mL/min/1.73m2 — ABNORMAL LOW (ref >=60)

## 2024-01-07 LAB — BRAIN NATRIURETIC PEPTIDE: Brain Natriuretic Peptide: 83 pg/mL (ref <100)

## 2024-01-07 MED ORDER — PREDNISONE 20 MG PO TABS: 40.0000 mg | ORAL_TABLET | Freq: Every day | ORAL | 0 refills | Status: AC

## 2024-01-14 ENCOUNTER — Ambulatory Visit: Admitting: Family Medicine

## 2024-01-29 NOTE — Telephone Encounter (Signed)
 Submitted provider portion of PAP application for Farxiga  & Breztri . Have made multiple attempts to patient about consent with [employer] and have not received a response.

## 2024-02-03 ENCOUNTER — Other Ambulatory Visit: Payer: Self-pay | Admitting: Family Medicine

## 2024-02-05 ENCOUNTER — Telehealth: Payer: Self-pay | Admitting: Family Medicine

## 2024-02-05 ENCOUNTER — Other Ambulatory Visit: Payer: Self-pay

## 2024-02-05 DIAGNOSIS — I340 Nonrheumatic mitral (valve) insufficiency: Secondary | ICD-10-CM

## 2024-02-05 DIAGNOSIS — R0609 Other forms of dyspnea: Secondary | ICD-10-CM

## 2024-02-05 MED ORDER — FUROSEMIDE 40 MG PO TABS: 40.0000 mg | ORAL_TABLET | Freq: Every day | ORAL | 1 refills | Status: AC

## 2024-02-05 NOTE — Telephone Encounter (Signed)
 Prescription Request  02/05/2024  LOV: 01/06/2024  What is the name of the medication or equipment?   furosemide  (LASIX ) 40 MG tablet  **90 day script requested**  Have you contacted your pharmacy to request a refill? Yes   Which pharmacy would you like this sent to?  [facility] - [place], [place] - [address] [place] [address] Phone: [phone] Fax: [phone]    Patient notified that their request is being sent to the clinical staff for review and that they should receive a response within 2 business days.   Please advise pharmacist.

## 2024-02-13 ENCOUNTER — Ambulatory Visit: Admitting: Family Medicine

## 2024-02-18 ENCOUNTER — Telehealth: Payer: Self-pay

## 2024-02-18 ENCOUNTER — Other Ambulatory Visit: Payer: Self-pay

## 2024-02-18 DIAGNOSIS — Z5982 Transportation insecurity: Secondary | ICD-10-CM

## 2024-02-18 DIAGNOSIS — Z5941 Food insecurity: Secondary | ICD-10-CM

## 2024-02-18 NOTE — Telephone Encounter (Signed)
 Called patient to schedule Annual Wellness Visit- patient asked about community resources in regards to food, she states she has no food or transportation and needs assistance. I advised patient I would send a message to office clinical staff to see if we could help her find some resources and someone from clinical staff would call her to follow up.

## 2024-02-20 ENCOUNTER — Telehealth: Payer: Self-pay

## 2024-02-20 NOTE — Progress Notes (Signed)
 Complex Care Management Note Care Guide Note  02/20/2024 Name: [name] MRN: [id number] DOB: [birth year]   Complex Care Management Outreach Attempts: An unsuccessful telephone outreach was attempted today to offer the patient information about available complex care management services.  Follow Up Plan:  Additional outreach attempts will be made to offer the patient complex care management information and services.   Encounter Outcome:  No Answer  Debbe Fuse [facility], [facility] Guide  Direct Dial: [phone]  Fax [phone]

## 2024-02-21 NOTE — Progress Notes (Unsigned)
 Complex Care Management Note Care Guide Note  02/21/2024 Name: [name] MRN: [id number] DOB: Oct 02, 1945   Complex Care Management Outreach Attempts: A second unsuccessful outreach was attempted today to offer the patient with information about available complex care management services.  Follow Up Plan:  Additional outreach attempts will be made to offer the patient complex care management information and services.   Encounter Outcome:  No Answer  Debbe Fuse [facility], [facility] Guide  Direct Dial: [phone]  Fax [phone]

## 2024-02-25 NOTE — Progress Notes (Signed)
 Complex Care Management Note Care Guide Note  02/25/2024 Name: [name] MRN: [id number] DOB: [birth year]   Complex Care Management Outreach Attempts: A third unsuccessful outreach was attempted today to offer the patient with information about available complex care management services.  Follow Up Plan:  No further outreach attempts will be made at this time. We have been unable to contact the patient to offer or enroll patient in complex care management services.  Encounter Outcome:  No Answer  Debbe Fuse [facility], [facility] Guide  Direct Dial: [phone]  Fax [phone]

## 2024-03-11 ENCOUNTER — Ambulatory Visit

## 2024-03-12 ENCOUNTER — Ambulatory Visit

## 2024-03-12 ENCOUNTER — Ambulatory Visit: Admitting: Family Medicine
